# Patient Record
Sex: Male | Born: 1955 | Race: White | Hispanic: No | Marital: Married | Smoking: Former smoker
Health system: Southern US, Community
[De-identification: ages and names within clinical notes are randomized; demographics above are authoritative.]

## PROBLEM LIST (undated history)

## (undated) DIAGNOSIS — U071 COVID-19: Secondary | ICD-10-CM

## (undated) DIAGNOSIS — I10 Essential (primary) hypertension: Secondary | ICD-10-CM

## (undated) DIAGNOSIS — K219 Gastro-esophageal reflux disease without esophagitis: Secondary | ICD-10-CM

## (undated) DIAGNOSIS — E785 Hyperlipidemia, unspecified: Secondary | ICD-10-CM

## (undated) DIAGNOSIS — M1990 Unspecified osteoarthritis, unspecified site: Secondary | ICD-10-CM

## (undated) DIAGNOSIS — J189 Pneumonia, unspecified organism: Secondary | ICD-10-CM

## (undated) HISTORY — PX: TONSILLECTOMY: SUR1361

## (undated) HISTORY — PX: CARPAL TUNNEL RELEASE: SHX101

## (undated) HISTORY — DX: Pneumonia, unspecified organism: J18.9

## (undated) HISTORY — PX: ANTERIOR CERVICAL DECOMP/DISCECTOMY FUSION: SHX1161

## (undated) HISTORY — DX: Gastro-esophageal reflux disease without esophagitis: K21.9

## (undated) HISTORY — DX: Unspecified osteoarthritis, unspecified site: M19.90

## (undated) HISTORY — PX: BACK SURGERY: SHX140

## (undated) HISTORY — PX: COLON SURGERY: SHX602

## (undated) HISTORY — PX: CHOLECYSTECTOMY: SHX55

---

## 1993-03-03 DIAGNOSIS — J189 Pneumonia, unspecified organism: Secondary | ICD-10-CM

## 1993-03-03 HISTORY — DX: Pneumonia, unspecified organism: J18.9

## 2009-05-14 ENCOUNTER — Encounter: Admission: RE | Admit: 2009-05-14 | Discharge: 2009-08-12 | Payer: Self-pay | Admitting: Neurosurgery

## 2009-11-06 ENCOUNTER — Ambulatory Visit: Payer: Self-pay | Admitting: Cardiology

## 2009-11-16 ENCOUNTER — Encounter: Admission: RE | Admit: 2009-11-16 | Discharge: 2009-11-16 | Payer: Self-pay | Admitting: Cardiology

## 2009-11-16 IMAGING — US US AORTA
1 series · 14 of 21 positions shown · non-contrast
Comparison: None.

CLINICAL DATA: Hypertension, smoker, increased abdominal girth

ABDOMINAL AORTA SCREENING ULTRASOUND
TECHNIQUE: Ultrasound examination of the abdominal aorta was
performed as a screening evaluation for abdominal aortic aneurysm.
The proximal iliac arteries were also evaluated bilaterally.

[Series 1: us aorta · 14 of 21 slices shown]
[im 1/21]
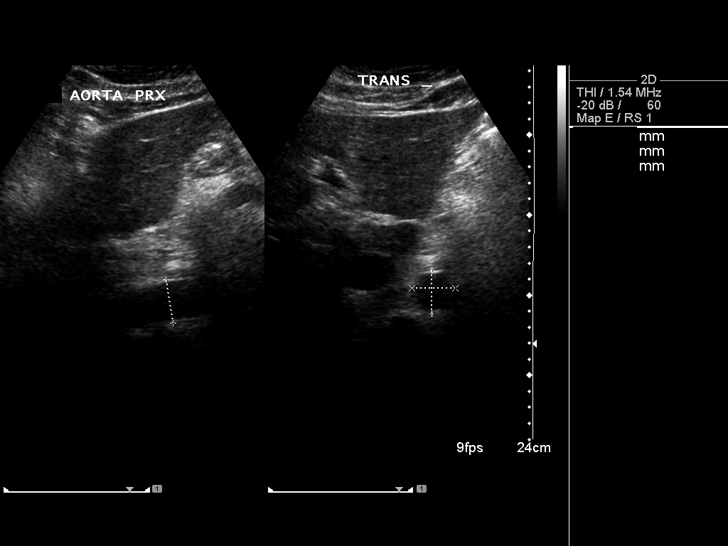
[im 3/21]
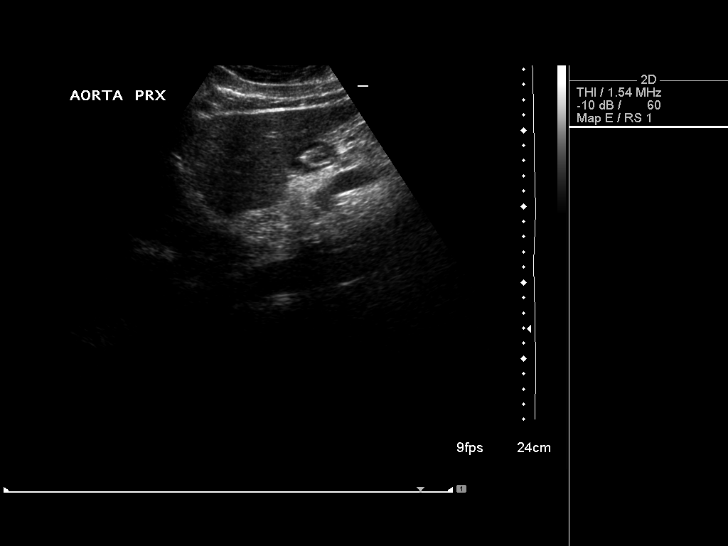
[im 4/21]
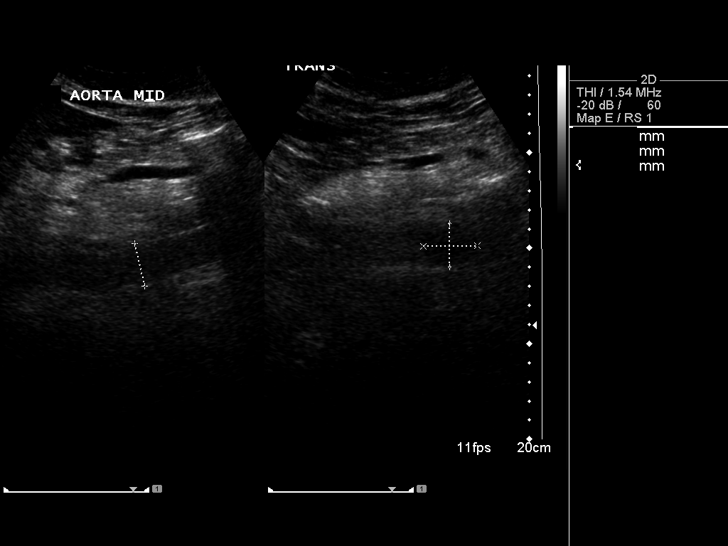
[im 6/21]
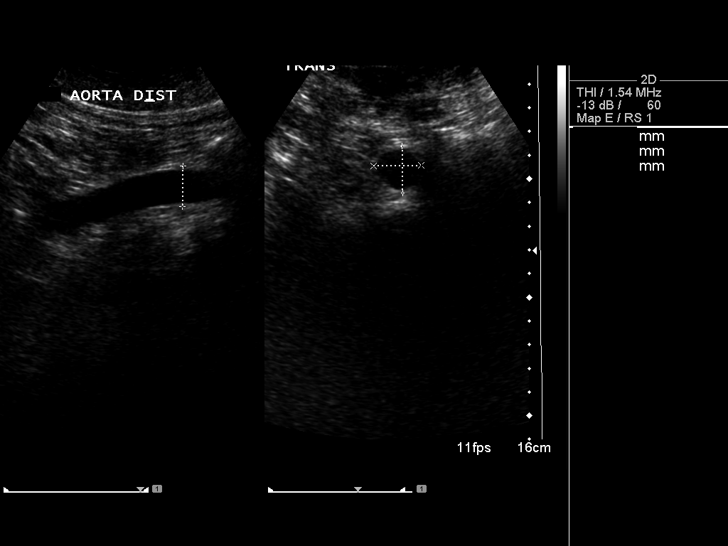
[im 7/21]
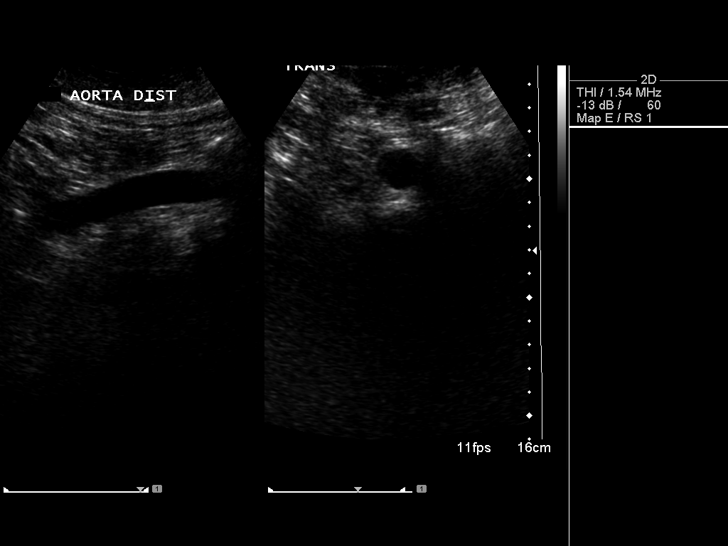
[im 9/21]
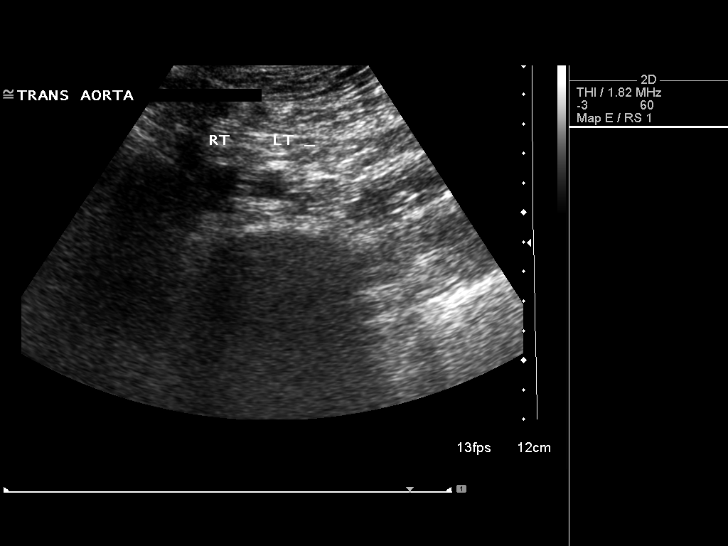
[im 10/21]
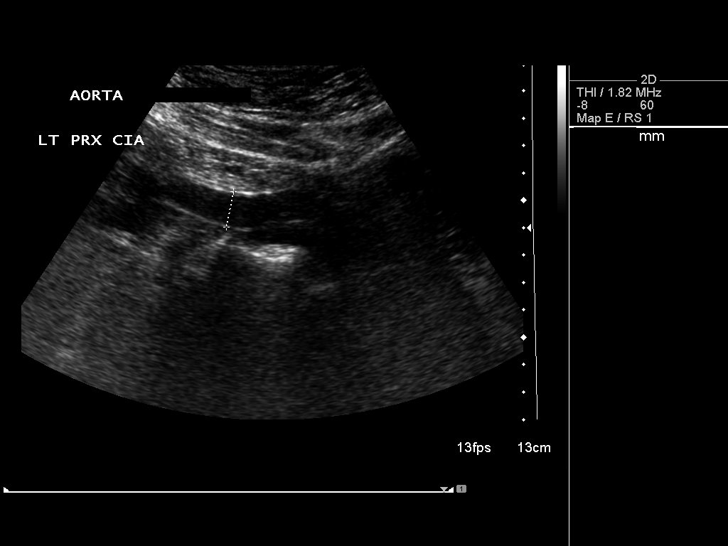
[im 12/21]
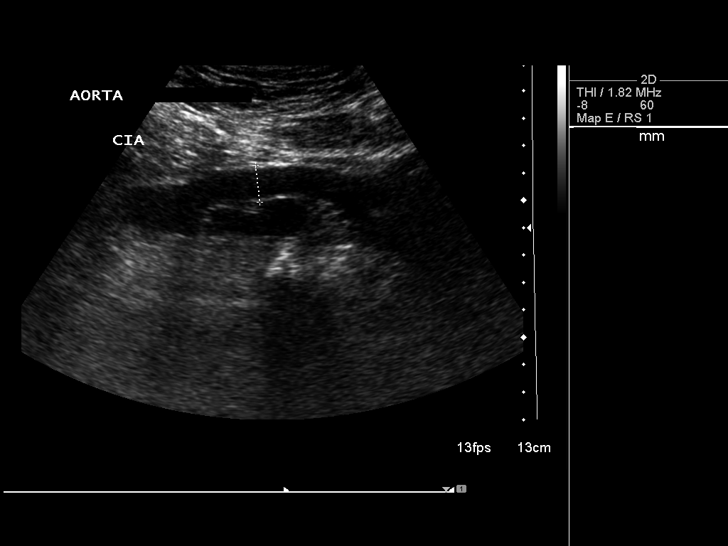
[im 13/21]
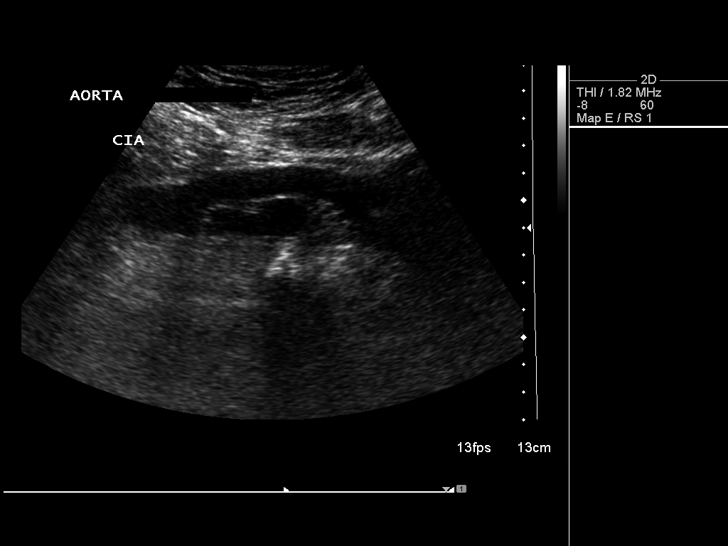
[im 15/21]
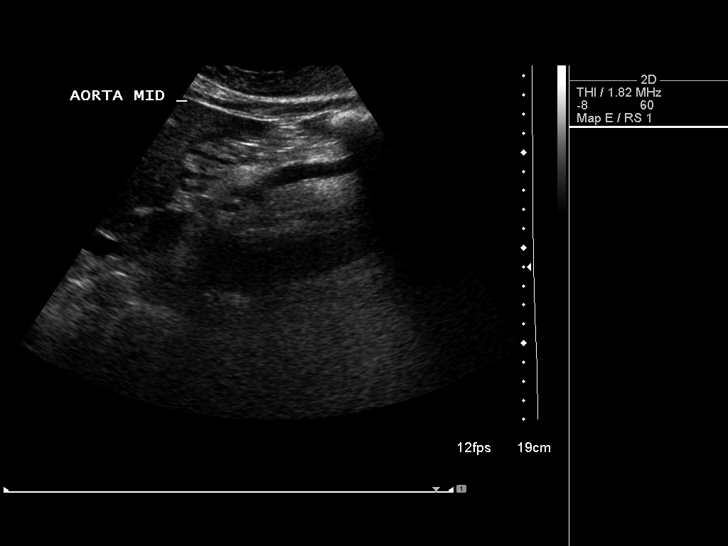
[im 16/21]
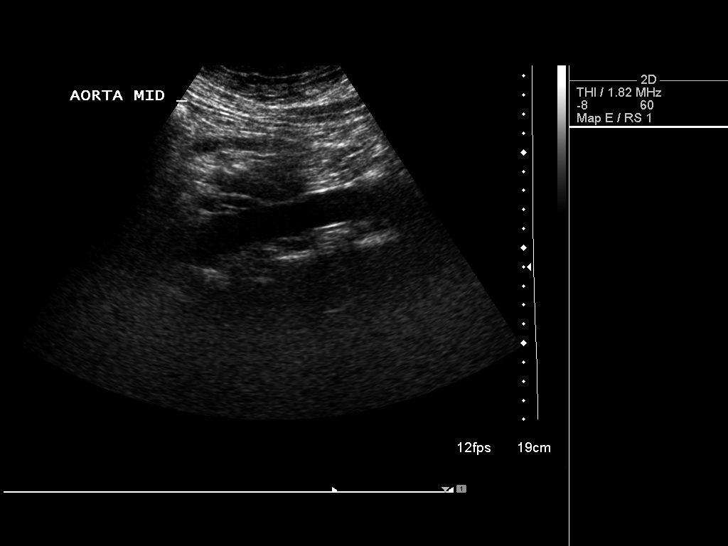
[im 18/21]
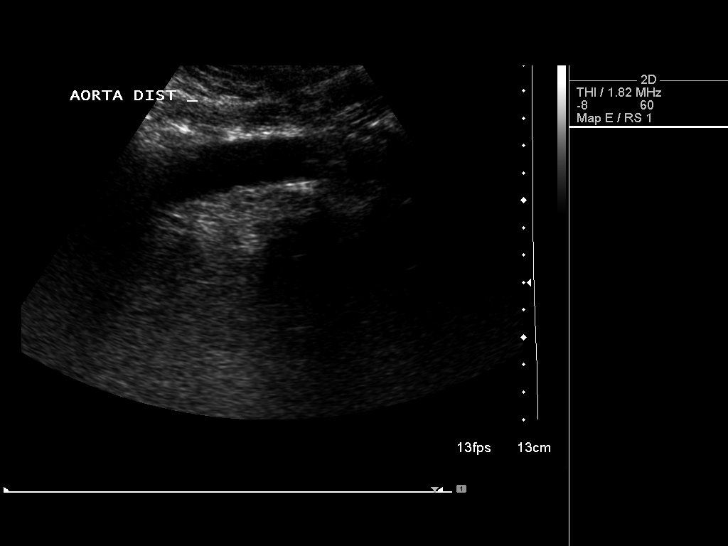
[im 19/21]
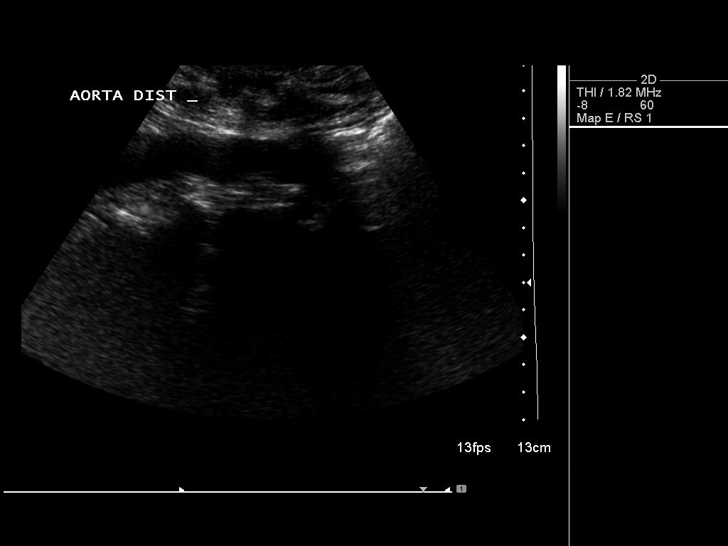
[im 21/21]
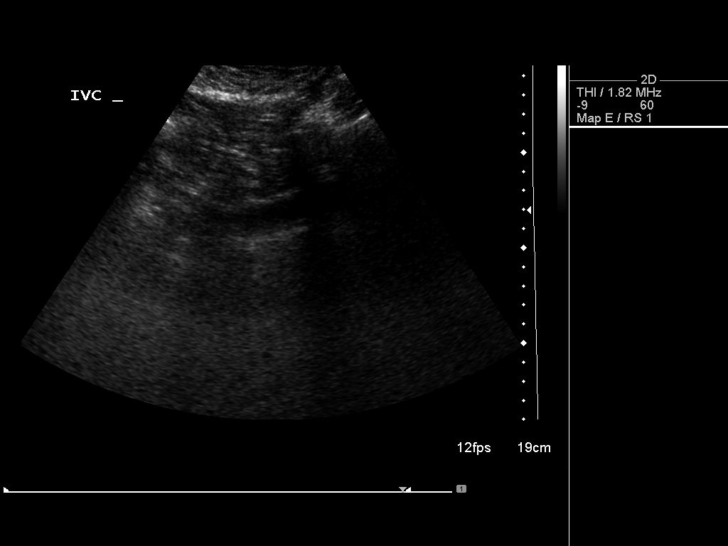

[14 of 21 positions shown; findings below may reference images not displayed]

FINDINGS: Abdominal aorta maximal AP diameter is 2.7 cm and maximal
transverse diameter is 2.8 cm.  Negative for aneurysm.  Right
common iliac artery measures 1.4 cm and the left common iliac
artery measures 1.3 cm.

IVC appears unremarkable.  No free fluid or ascites.
IMPRESSION: Negative for aneurysm.

## 2009-11-20 ENCOUNTER — Ambulatory Visit: Payer: Self-pay | Admitting: Cardiology

## 2009-11-20 ENCOUNTER — Ambulatory Visit (HOSPITAL_COMMUNITY): Admission: RE | Admit: 2009-11-20 | Discharge: 2009-11-20 | Payer: Self-pay | Admitting: Cardiology

## 2010-02-26 ENCOUNTER — Encounter
Admission: RE | Admit: 2010-02-26 | Discharge: 2010-04-02 | Payer: Self-pay | Source: Home / Self Care | Attending: Neurology | Admitting: Neurology

## 2010-03-24 ENCOUNTER — Encounter: Payer: Self-pay | Admitting: Cardiology

## 2011-11-26 ENCOUNTER — Encounter (INDEPENDENT_AMBULATORY_CARE_PROVIDER_SITE_OTHER): Payer: Self-pay | Admitting: *Deleted

## 2011-12-03 ENCOUNTER — Ambulatory Visit (INDEPENDENT_AMBULATORY_CARE_PROVIDER_SITE_OTHER): Payer: Self-pay | Admitting: Internal Medicine

## 2011-12-23 ENCOUNTER — Telehealth (INDEPENDENT_AMBULATORY_CARE_PROVIDER_SITE_OTHER): Payer: Self-pay | Admitting: *Deleted

## 2011-12-23 ENCOUNTER — Encounter (INDEPENDENT_AMBULATORY_CARE_PROVIDER_SITE_OTHER): Payer: Self-pay | Admitting: Internal Medicine

## 2011-12-23 ENCOUNTER — Other Ambulatory Visit (INDEPENDENT_AMBULATORY_CARE_PROVIDER_SITE_OTHER): Payer: Self-pay | Admitting: *Deleted

## 2011-12-23 ENCOUNTER — Ambulatory Visit (INDEPENDENT_AMBULATORY_CARE_PROVIDER_SITE_OTHER): Payer: Federal, State, Local not specified - PPO | Admitting: Internal Medicine

## 2011-12-23 ENCOUNTER — Encounter (INDEPENDENT_AMBULATORY_CARE_PROVIDER_SITE_OTHER): Payer: Self-pay | Admitting: *Deleted

## 2011-12-23 VITALS — BP 100/72 | HR 60 | Temp 97.7°F | Ht 67.0 in | Wt 285.1 lb

## 2011-12-23 DIAGNOSIS — K922 Gastrointestinal hemorrhage, unspecified: Secondary | ICD-10-CM

## 2011-12-23 DIAGNOSIS — K279 Peptic ulcer, site unspecified, unspecified as acute or chronic, without hemorrhage or perforation: Secondary | ICD-10-CM

## 2011-12-23 DIAGNOSIS — I10 Essential (primary) hypertension: Secondary | ICD-10-CM

## 2011-12-23 DIAGNOSIS — K219 Gastro-esophageal reflux disease without esophagitis: Secondary | ICD-10-CM | POA: Insufficient documentation

## 2011-12-23 DIAGNOSIS — E7800 Pure hypercholesterolemia, unspecified: Secondary | ICD-10-CM

## 2011-12-23 LAB — CBC WITH DIFFERENTIAL/PLATELET
Basophils Absolute: 0.1 10*3/uL (ref 0.0–0.1)
Lymphocytes Relative: 28 % (ref 12–46)
Neutro Abs: 4.4 10*3/uL (ref 1.7–7.7)
Platelets: 198 10*3/uL (ref 150–400)
RDW: 13.1 % (ref 11.5–15.5)
WBC: 7.7 10*3/uL (ref 4.0–10.5)

## 2011-12-23 LAB — IRON AND TIBC
Iron: 83 ug/dL (ref 42–165)
UIBC: 249 ug/dL (ref 125–400)

## 2011-12-23 NOTE — Telephone Encounter (Signed)
I would not recommend no sedation

## 2011-12-23 NOTE — Progress Notes (Signed)
Subjective:     Patient ID: Antonio Lucas, male   DOB: [birth year], 56 y.o.   MRN: [id number]  HPIReferred to our office by Dr. Lysbeth Galas for hx of PUD and rectal bleeding. He tells me he has had rectal bleeding off and on for 1 1/2 yrs. Says it is unpredictable.  He may bleed for 2-3 days in a row and then go for a month with no blood.  He underwent a colonoscopy within a year by Dr. Noe Gens and he thinks there may have been a small polyp. Hx of colon polyps. He had an EGD May of this year for GERD. He tells me he had 2 ulcers.  He says one ulcer was large. He was suppose to have a surveillance EGD 3 months later but did not follow up. Appetite is good. He says he has lost 7 pounds. He has BM daily. No recent rectal bleeding. Frequent acid reflux even with PPI. C/o dysphagia which occurs about 3 times a week. He says anything will lodge even fluids. He says the bolus will eventually go down.  Review of Systems see hpi Current Outpatient Prescriptions  Medication Sig Dispense Refill  . ALPRAZolam (XANAX) 1 MG tablet Take 1 mg by mouth at bedtime as needed.      Marland Kitchen amLODipine (NORVASC) 5 MG tablet Take 5 mg by mouth daily.      . Armodafinil (NUVIGIL) 150 MG tablet Take 150 mg by mouth daily.      Marland Kitchen dexlansoprazole (DEXILANT) 60 MG capsule Take 60 mg by mouth daily.      . furosemide (LASIX) 20 MG tablet Take 20 mg by mouth 2 (two) times daily.      Marland Kitchen HYDROcodone-acetaminophen (NORCO) 10-325 MG per tablet Take 1 tablet by mouth every 6 (six) hours as needed.      . meloxicam (MOBIC) 15 MG tablet Take 15 mg by mouth daily.      . nebivolol (BYSTOLIC) 5 MG tablet Take 10 mg by mouth daily.       Marland Kitchen oxcarbazepine (TRILEPTAL) 600 MG tablet Take 600 mg by mouth daily.      . simvastatin (ZOCOR) 40 MG tablet Take 40 mg by mouth every evening.      . tadalafil (CIALIS) 20 MG tablet Take 20 mg by mouth daily as needed.      . testosterone (ANDROGEL) 50 MG/5GM GEL Place 5 g onto the skin daily.       Past  Medical History  Diagnosis Date  . Pneumonia 1995  . GERD (gastroesophageal reflux disease)   . Arthritis    Past Surgical History  Procedure Date  . Back surgery     02.2011- HE HAD SOME NECK PROBLEMS WITH C7 .  . Carpal tunnel release     both arms   Family Status  Relation Status Death Age  . Mother Deceased     lived to age 71  . Father Deceased 3    after bypass surgery   History   Social History  . Marital Status: Married    Spouse Name: N/A    Number of Children: N/A  . Years of Education: N/A   Occupational History  . Not on file.   Social History Main Topics  . Smoking status: Never Smoker   . Smokeless tobacco: Not on file  . Alcohol Use: No  . Drug Use: No  . Sexually Active: Not on file   Other Topics Concern  . Not on  file   Social History Narrative   He has 3 children  With one son with Epsteins anomaly. He ihas has surgical surgical repair . Denman stopped smoking 3 years ago one time , he smoked 2-3 packs per day. He will drink one glass of wine per day. He works on [employer].   Allergies  Allergen Reactions  . Duragen (Estradiol Valerate)     duragesic patch causes nausea       Objective:   Physical Exam Filed Vitals:   12/23/11 0947  BP: 100/72  Pulse: 60  Temp: 97.7 F (36.5 C)  Height: 5\' 7"  (1.702 m)  Weight: 285 lb 1.6 oz (129.321 kg)   Alert and oriented. Skin warm and dry. Oral mucosa is moist.   . Sclera anicteric, conjunctivae is pink. Thyroid not enlarged. No cervical lymphadenopathy. Lungs clear. Heart regular rate and rhythm.  Abdomen is soft. Bowel sounds are positive. No hepatomegaly. No abdominal masses felt. No tenderness.  No edema to lower extremities.  Stool loose, guaiac negative     Assessment:    Hx of GI bleed per patient. PUD. In need of surveillance EGD to document healing of large ulcer.    Plan:    Will get records from Dr. Phill Mutter office. CBC, Iron studies. EGD/ED with Dr. Karilyn Cota. The  risks and benefits such as perforation, bleeding, and infection were reviewed with the patient and is agreeable.

## 2011-12-23 NOTE — Telephone Encounter (Signed)
Patient sch'd for EGD/ED 01/07/12 and wants to know if you would consider doing procedure without sedation

## 2011-12-23 NOTE — Patient Instructions (Addendum)
EGD/ED with Dr. Rehman. The risks and benefits such as perforation, bleeding, and infection were reviewed with the patient and is agreeable. 

## 2011-12-24 ENCOUNTER — Telehealth (INDEPENDENT_AMBULATORY_CARE_PROVIDER_SITE_OTHER): Payer: Self-pay | Admitting: Internal Medicine

## 2011-12-24 NOTE — Telephone Encounter (Signed)
Received records from [facility]. EGD 02/17/2012: Esophagitis, Gastritis and Gastric Ulcer. Need f/u EGD in 2 months which he did not have. H. Pylori was negative.11/19/2011 11/19/2011 H and H 16.6 and 47.9, Iron 247, TIBC 331, UIBC 84, % Sat 75.

## 2011-12-24 NOTE — Telephone Encounter (Signed)
Patient made aware.

## 2011-12-26 ENCOUNTER — Ambulatory Visit (INDEPENDENT_AMBULATORY_CARE_PROVIDER_SITE_OTHER): Payer: Federal, State, Local not specified - PPO | Admitting: Cardiology

## 2011-12-26 ENCOUNTER — Encounter: Payer: Self-pay | Admitting: Cardiology

## 2011-12-26 VITALS — BP 131/82 | HR 58 | Ht 67.0 in | Wt 284.0 lb

## 2011-12-26 DIAGNOSIS — R0602 Shortness of breath: Secondary | ICD-10-CM

## 2011-12-26 DIAGNOSIS — Z79899 Other long term (current) drug therapy: Secondary | ICD-10-CM

## 2011-12-26 DIAGNOSIS — I10 Essential (primary) hypertension: Secondary | ICD-10-CM

## 2011-12-26 DIAGNOSIS — E7800 Pure hypercholesterolemia, unspecified: Secondary | ICD-10-CM

## 2011-12-26 DIAGNOSIS — R609 Edema, unspecified: Secondary | ICD-10-CM

## 2011-12-26 MED ORDER — ATORVASTATIN CALCIUM 40 MG PO TABS: 40.0000 mg | ORAL_TABLET | Freq: Every evening | ORAL | Status: DC

## 2011-12-26 NOTE — Patient Instructions (Signed)
   Echo  Office will contact with results  Stop Zocor  Change to generic Lipitor 40mg  every evening  Labs - due in 2 months (around December 25) for fasting lipid and liver   Aerobic exercise 20-30 minutes 5-6 x week Continue all other current medications.  Your physician wants you to follow up in: 6 months.  You will receive a reminder letter in the mail one-two months in advance.  If you don't receive a letter, please call our office to schedule the follow up appointment

## 2011-12-27 DIAGNOSIS — R609 Edema, unspecified: Secondary | ICD-10-CM | POA: Insufficient documentation

## 2011-12-27 NOTE — Progress Notes (Signed)
Patient ID: Antonio Lucas, male   DOB: [birth year], 56 y.o.   MRN: [id number] PCP: Dr. Lysbeth Galas  55 yo with history of HTN and hyperlipidemia with a strong family history of premature CAD presents for cardiology evaluation.  He has been seen by Dr. Deborah Chalk in the past and is seen by me for the first time today.  In general, patient has been stable symptomatically.  He had a stress test in 2011 that per his report was normal.  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  No exertional dyspnea but he does not feel that he has the stamina that he had in the past.  He has a lot of low back pain that limits exercise.  He has occasional nonexertional left chest pain that can last a few seconds to several minutes.  No particular trigger.  He has chronic lower extremity edema and take Lasix for this.  He is under a fair amount of stress at work.  He does not get much exercise at home.   PMH: 1. OSA: On CPAP 2. H/o L-spine surgery 3. HTN 4. GERD 5. Nuclear stress test in 2011 was normal.  6. Hyperlipidemia 7. Venous insufficiency 8. Obesity  SH: Lives in [place], quit smoking in 2008, 4 children.  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   FH: Father with first MI at 40, uncle with MI at 62, other uncles with MIs in their 18s and 93s.    ROS: All systems reviewed and negative except as per HPI.   Current Outpatient Prescriptions  Medication Sig Dispense Refill  . ALPRAZolam (XANAX) 1 MG tablet Take 1 mg by mouth at bedtime as needed.      Marland Kitchen amLODipine (NORVASC) 5 MG tablet Take 5 mg by mouth daily.      . Armodafinil (NUVIGIL) 150 MG tablet Take 150 mg by mouth daily.      Marland Kitchen dexlansoprazole (DEXILANT) 60 MG capsule Take 60 mg by mouth daily.      . furosemide (LASIX) 20 MG tablet Take 20 mg by mouth daily.       Marland Kitchen HYDROcodone-acetaminophen (NORCO) 10-325 MG per tablet Take 1 tablet by mouth every 6 (six) hours as needed.      Marland Kitchen JALYN 0.5-0.4 MG CAPS Take 1 capsule by  mouth Daily.      Marland Kitchen lamoTRIgine (LAMICTAL) 25 MG tablet Take 1 tablet by mouth Daily.      . meloxicam (MOBIC) 15 MG tablet Take 15 mg by mouth daily.      . metoCLOPramide (REGLAN) 10 MG tablet Take 1 tablet by mouth Daily.      . nebivolol (BYSTOLIC) 5 MG tablet Take 10 mg by mouth daily.       Marland Kitchen oxcarbazepine (TRILEPTAL) 600 MG tablet Take 600 mg by mouth daily.      . tadalafil (CIALIS) 20 MG tablet Take 20 mg by mouth daily as needed.      . testosterone cypionate (DEPOTESTOTERONE CYPIONATE) 200 MG/ML injection Inject 1 mL into the muscle Once every 2 weeks.      . traZODone (DESYREL) 150 MG tablet Take 1 tablet by mouth daily as needed.       . Vitamin D, Ergocalciferol, (DRISDOL) 50000 UNITS CAPS Take 1 capsule by mouth Once a week.      Marland Kitchen atorvastatin (LIPITOR) 40 MG tablet Take 1 tablet (40 mg total) by mouth every evening.  30 tablet  6    BP 131/82  Pulse 58  Ht 5\' 7"  (1.702 m)  Wt 284 lb (128.822 kg)  BMI 44.48 kg/m2  SpO2 96% General: NAD, obese Neck: Thick, no JVD, no thyromegaly or thyroid nodule.  Lungs: Clear to auscultation bilaterally with normal respiratory effort. CV: Nondisplaced PMI.  Heart regular S1/S2, no S3/S4, no murmur.  1+ edema 3/4 to knees bilaterally.  No carotid bruit.  Normal pedal pulses.  Abdomen: Soft, nontender, no hepatosplenomegaly, no distention.  Skin: Intact without lesions or rashes.  Neurologic: Alert and oriented x 3.  Psych: Normal affect. Extremities: No clubbing or cyanosis.  HEENT: Normal.   Assessment/Plan: 1. Hyperlipidemia: Patient is on Zocor 40 and amlodipine 5.  This combination leads to increased risks of statin-related side effects. I am going to have him stop Zocor and instead take atorvastatin 40 mg daily.  I will have him return for lipids/LFTs in 2 months.  2. HTN: BP is under good control.  He is on amlodipine, which can cause lower extremity edema.  Alternatively, the edema may be due to abdominal obesity and lower  extremity venous insufficiency.  I am going to have him continue amlodipine for now.  3. Lower extremity edema: Suspect major cause is venous insufficiency in the setting of abdominal obesity.  Amlodipine may play a role.  I think CHF is less likely.  I will get an echocardiogram to make sure that LV and RV function appear normal.  Weight loss will likely help.  I would not increase Lasix further as JVP is not elevated.  4. Obesity: Patient needs weight loss.  We discussed diet and exercise strategies today.  5. CAD risk: Strong family history of premature CAD.  He does not have ischemic symptoms.  He should continue statin and I will start him on ASA 81 mg daily.   Marca Ancona 12/27/2011 12:03 PM

## 2011-12-31 ENCOUNTER — Encounter (HOSPITAL_COMMUNITY): Payer: Self-pay | Admitting: Pharmacy Technician

## 2012-01-01 ENCOUNTER — Other Ambulatory Visit: Payer: Federal, State, Local not specified - PPO

## 2012-01-01 ENCOUNTER — Encounter: Payer: Self-pay | Admitting: Physician Assistant

## 2012-01-01 DIAGNOSIS — R0989 Other specified symptoms and signs involving the circulatory and respiratory systems: Secondary | ICD-10-CM

## 2012-01-07 ENCOUNTER — Encounter (HOSPITAL_COMMUNITY): Payer: Self-pay | Admitting: *Deleted

## 2012-01-07 ENCOUNTER — Encounter (HOSPITAL_COMMUNITY)
Admission: RE | Disposition: A | Discharge status: Home / Self Care | Payer: Self-pay | Source: Ambulatory Visit | Attending: Internal Medicine

## 2012-01-07 ENCOUNTER — Ambulatory Visit (HOSPITAL_COMMUNITY)
Admission: RE | Admit: 2012-01-07 | Discharge: 2012-01-07 | Disposition: A | Discharge status: Home / Self Care | Payer: Federal, State, Local not specified - PPO | Source: Ambulatory Visit | Attending: Internal Medicine | Admitting: Internal Medicine

## 2012-01-07 ENCOUNTER — Other Ambulatory Visit: Payer: Federal, State, Local not specified - PPO

## 2012-01-07 DIAGNOSIS — K2289 Other specified disease of esophagus: Secondary | ICD-10-CM | POA: Insufficient documentation

## 2012-01-07 DIAGNOSIS — K219 Gastro-esophageal reflux disease without esophagitis: Secondary | ICD-10-CM | POA: Insufficient documentation

## 2012-01-07 DIAGNOSIS — R1310 Dysphagia, unspecified: Secondary | ICD-10-CM | POA: Insufficient documentation

## 2012-01-07 DIAGNOSIS — K922 Gastrointestinal hemorrhage, unspecified: Secondary | ICD-10-CM

## 2012-01-07 DIAGNOSIS — K279 Peptic ulcer, site unspecified, unspecified as acute or chronic, without hemorrhage or perforation: Secondary | ICD-10-CM

## 2012-01-07 DIAGNOSIS — K449 Diaphragmatic hernia without obstruction or gangrene: Secondary | ICD-10-CM

## 2012-01-07 DIAGNOSIS — I10 Essential (primary) hypertension: Secondary | ICD-10-CM | POA: Insufficient documentation

## 2012-01-07 DIAGNOSIS — Z8719 Personal history of other diseases of the digestive system: Secondary | ICD-10-CM

## 2012-01-07 DIAGNOSIS — K2960 Other gastritis without bleeding: Secondary | ICD-10-CM

## 2012-01-07 DIAGNOSIS — Z8711 Personal history of peptic ulcer disease: Secondary | ICD-10-CM

## 2012-01-07 DIAGNOSIS — K228 Other specified diseases of esophagus: Secondary | ICD-10-CM | POA: Insufficient documentation

## 2012-01-07 HISTORY — DX: Hyperlipidemia, unspecified: E78.5

## 2012-01-07 HISTORY — PX: ESOPHAGOGASTRODUODENOSCOPY (EGD) WITH ESOPHAGEAL DILATION: SHX5812

## 2012-01-07 HISTORY — DX: Essential (primary) hypertension: I10

## 2012-01-07 SURGERY — ESOPHAGOGASTRODUODENOSCOPY (EGD) WITH ESOPHAGEAL DILATION
Anesthesia: Moderate Sedation

## 2012-01-07 MED ORDER — MEPERIDINE HCL 50 MG/ML IJ SOLN
INTRAMUSCULAR | Status: AC
  Filled 2012-01-07: qty 1

## 2012-01-07 MED ORDER — SODIUM CHLORIDE 0.45 % IV SOLN
INTRAVENOUS | Status: DC
  Administered 2012-01-07: 1000 mL via INTRAVENOUS

## 2012-01-07 MED ORDER — MEPERIDINE HCL 25 MG/ML IJ SOLN
INTRAMUSCULAR | Status: DC | PRN
  Administered 2012-01-07 (×2): 25 mg via INTRAVENOUS

## 2012-01-07 MED ORDER — MELOXICAM 15 MG PO TABS: 7.5000 mg | ORAL_TABLET | Freq: Every day | ORAL | Status: DC

## 2012-01-07 MED ORDER — MIDAZOLAM HCL 5 MG/5ML IJ SOLN
INTRAMUSCULAR | Status: AC
  Filled 2012-01-07: qty 10

## 2012-01-07 MED ORDER — BUTAMBEN-TETRACAINE-BENZOCAINE 2-2-14 % EX AERO
INHALATION_SPRAY | CUTANEOUS | Status: DC | PRN
  Administered 2012-01-07: 2 via TOPICAL

## 2012-01-07 MED ORDER — STERILE WATER FOR IRRIGATION IR SOLN
Status: DC | PRN
  Administered 2012-01-07: 14:00:00

## 2012-01-07 MED ORDER — MIDAZOLAM HCL 5 MG/5ML IJ SOLN
INTRAMUSCULAR | Status: DC | PRN
  Administered 2012-01-07 (×2): 2 mg via INTRAVENOUS
  Administered 2012-01-07: 3 mg via INTRAVENOUS

## 2012-01-07 NOTE — H&P (Addendum)
Antonio Lucas is an 56 y.o. male.   Chief Complaint: Patient is here for esophagogastroduodenoscopy. HPI: Patient is 56 year old Caucasian male who has history of peptic ulcer disease at age 11. He was found to have to gastric ulcers in may 2013. He's been maintained on PPI. He's also been maintained on his meloxicam. He was advised to return for followup exam in 3 months but he did not do so. His initial endoscopy was in [facility]. If 6 weeks ago he had an episode of tarry bleeding. Therefore he was referred to her office. His H&H in 2 weeks ago was normal. He hasn't passed any more blood since then. He denies abdominal pain he has a good appetite. His heartburn she controlled with therapy. His blood test for H. pylori came back negative. Patient also complains of intermittent solid food dysphagia.  Past Medical History  Diagnosis Date  . Pneumonia 1995  . GERD (gastroesophageal reflux disease)   . Arthritis   . Hyperlipemia   . Hypertension     Past Surgical History  Procedure Date  . Back surgery     02.2011- HE HAD SOME NECK PROBLEMS WITH C7 .  . Carpal tunnel release     both arms  . Tonsillectomy     History reviewed. No pertinent family history. Social History:  reports that he quit smoking about 4 years ago. His smoking use included Cigarettes. He started smoking about 49 years ago. He has a 80 pack-year smoking history. He has never used smokeless tobacco. He reports that he drinks alcohol. He reports that he does not use illicit drugs.  Allergies:  Allergies  Allergen Reactions  . Duragen (Estradiol Valerate)     duragesic patch causes nausea    Medications Prior to Admission  Medication Sig Dispense Refill  . ALPRAZolam (XANAX) 1 MG tablet Take 1 mg by mouth at bedtime as needed. Sleep/anxiety.      Marland Kitchen amLODipine (NORVASC) 5 MG tablet Take 5 mg by mouth daily.      . Armodafinil (NUVIGIL) 150 MG tablet Take 150 mg by mouth daily.      Marland Kitchen aspirin EC 81 MG  tablet Take 81 mg by mouth daily.      Marland Kitchen atorvastatin (LIPITOR) 40 MG tablet Take 1 tablet (40 mg total) by mouth every evening.  30 tablet  6  . dexlansoprazole (DEXILANT) 60 MG capsule Take 60 mg by mouth daily.      . furosemide (LASIX) 20 MG tablet Take 20 mg by mouth daily.       Marland Kitchen JALYN 0.5-0.4 MG CAPS Take 1 capsule by mouth Daily.      Marland Kitchen lamoTRIgine (LAMICTAL) 25 MG tablet Take 2 tablets by mouth Daily.       . meloxicam (MOBIC) 15 MG tablet Take 15 mg by mouth daily.      . metoCLOPramide (REGLAN) 10 MG tablet Take 1 tablet by mouth Daily.      . nebivolol (BYSTOLIC) 5 MG tablet Take 10 mg by mouth daily.       Marland Kitchen oxcarbazepine (TRILEPTAL) 600 MG tablet Take 600 mg by mouth daily.      Marland Kitchen testosterone cypionate (DEPOTESTOTERONE CYPIONATE) 200 MG/ML injection Inject 0.5 mLs into the muscle Once every 2 weeks.       . traZODone (DESYREL) 150 MG tablet Take 1 tablet by mouth at bedtime.       . Vitamin D, Ergocalciferol, (DRISDOL) 50000 UNITS CAPS Take 1 capsule  by mouth Once a week.      Marland Kitchen HYDROcodone-acetaminophen (NORCO) 10-325 MG per tablet Take 1 tablet by mouth every 6 (six) hours as needed. Pain.      . tadalafil (CIALIS) 20 MG tablet Take 20 mg by mouth daily as needed. ED.        No results found for this or any previous visit (from the past 48 hour(s)). No results found.  ROS  Blood pressure 135/84, pulse 61, temperature 97.8 F (36.6 C), resp. rate 19, height 5\' 7"  (1.702 m), weight 284 lb (128.822 kg), SpO2 94.00%. Physical Exam  Constitutional: He appears well-developed and well-nourished.  HENT:  Mouth/Throat: Oropharynx is clear and moist.  Eyes: Conjunctivae normal are normal. No scleral icterus.  Neck: No thyromegaly present.  Cardiovascular: Normal rate, regular rhythm and normal heart sounds.   No murmur heard. Respiratory: Effort normal and breath sounds normal.  GI: Soft. He exhibits no distension. There is no tenderness.  Musculoskeletal: He exhibits no  edema.  Lymphadenopathy:    He has no cervical adenopathy.  Neurological: He is alert.  Skin: Skin is warm and dry.     Assessment/Plan History of peptic ulcer disease with recurrent bleed. Patient also complains of dysphagia. EGD with ED.  Ercie Eliasen U 01/07/2012, 2:15 PM

## 2012-01-07 NOTE — Op Note (Signed)
EGD PROCEDURE REPORT  PATIENT:  Antonio Lucas  MR#:  [id number] Birthdate:  09/20/1955, 56 y.o., male Endoscopist:  Dr. Malissa Hippo, MD Referred By:  Dr. Josue Hector, MD Procedure Date: 01/07/2012  Procedure:   EGD and ED.  Indications:  Patient is 56 year old Caucasian male was history of recurrent peptic ulcers disease dating back to age 77. His last EGD was in may 2013 in [place], [place] near to gastric ulcers. He was advised followup but he did not do so. But 6 weeks ago had tarry stools. His hemoglobin is 15.1. He is on PPI but he remains on meloxicam. He also complains of intermittent solid food dysphagia. He is undergoing diagnostic/therapeutic EGD.            Informed Consent:  The risks, benefits, alternatives & imponderables which include, but are not limited to, bleeding, infection, perforation, drug reaction and potential missed lesion have been reviewed.  The potential for biopsy, lesion removal, esophageal dilation, etc. have also been discussed.  Questions have been answered.  All parties agreeable.  Please see history & physical in medical record for more information.  Medications:  Demerol 50 mg IV Versed 7 mg IV Cetacaine spray topically for oropharyngeal anesthesia  Description of procedure:  The endoscope was introduced through the mouth and advanced to the second portion of the duodenum without difficulty or limitations. The mucosal surfaces were surveyed very carefully during advancement of the scope and upon withdrawal.  Findings:  Esophagus:  Mucosa of the esophagus was normal. There was patchy and linear cheesy exudate at esophageal body.  GEJ:  40 cm Hiatus:  42 cm Stomach:  Stomach was empty and distended very well with insufflation. Folds in the proximal stomach were normal. Examination of mucosa at body was normal. There were few antral erosions. No ulcer was identified . Pyloric channel was patent. Angularis, fundus and cardia were  examined by retroflexing the scope and were normal. Duodenum:  Normal bulbar and post bulbar mucosa.  Therapeutic/Diagnostic Maneuvers Performed:  Brushing was obtained from the esophagus for KOH prep.  Esophagus was dilated by passing 56 [place] Maloney dilator. Esophagus was examined post dilation and no mucosal disruption noted.  Complications:  None  Impression: Patchy whitish exudate coating esophageal mucosa. Brushing taken to rule out Candida esophagitis. He was she identified gastric ulcers have completely healed. Small sliding hiatal hernia without ring or stricture formation. Esophagus dilated by passing 56 [place] Maloney dilator given history of dysphagia. Antral erosions most likely secondary to NSAIDs.   Recommendations:  Patient will continue anti-reflux measures and  Dexlansoprazole as before. Patient advised to discontinue metoclopramide. Patient advised to decrease meloxicam to 7.5-15 mg by mouth daily when necessary. I will contact patient with results of KOH prep. Office visit in 6 weeks.  Lenora Gomes U  01/07/2012  2:49 PM  CC: Dr. Josue Hector, MD & Dr. Bonnetta Barry ref. provider found

## 2012-01-08 ENCOUNTER — Other Ambulatory Visit (INDEPENDENT_AMBULATORY_CARE_PROVIDER_SITE_OTHER): Payer: Federal, State, Local not specified - PPO

## 2012-01-08 ENCOUNTER — Other Ambulatory Visit: Payer: Self-pay

## 2012-01-08 DIAGNOSIS — R0602 Shortness of breath: Secondary | ICD-10-CM

## 2012-01-09 ENCOUNTER — Encounter: Payer: Self-pay | Admitting: *Deleted

## 2012-01-13 ENCOUNTER — Encounter (HOSPITAL_COMMUNITY): Payer: Self-pay | Admitting: Internal Medicine

## 2012-01-14 NOTE — Progress Notes (Signed)
Apt has been scheduled for 03/09/11 with Dorene Ar, NP.

## 2012-01-16 ENCOUNTER — Encounter (INDEPENDENT_AMBULATORY_CARE_PROVIDER_SITE_OTHER): Payer: Self-pay

## 2012-01-29 ENCOUNTER — Emergency Department (HOSPITAL_BASED_OUTPATIENT_CLINIC_OR_DEPARTMENT_OTHER)
Admission: EM | Admit: 2012-01-29 | Discharge: 2012-01-29 | Disposition: A | Discharge status: Home / Self Care | Payer: Federal, State, Local not specified - PPO | Attending: Emergency Medicine | Admitting: Emergency Medicine

## 2012-01-29 ENCOUNTER — Emergency Department (HOSPITAL_BASED_OUTPATIENT_CLINIC_OR_DEPARTMENT_OTHER): Payer: Federal, State, Local not specified - PPO

## 2012-01-29 ENCOUNTER — Encounter (HOSPITAL_BASED_OUTPATIENT_CLINIC_OR_DEPARTMENT_OTHER): Payer: Self-pay | Admitting: Emergency Medicine

## 2012-01-29 DIAGNOSIS — Z7982 Long term (current) use of aspirin: Secondary | ICD-10-CM | POA: Insufficient documentation

## 2012-01-29 DIAGNOSIS — Y9301 Activity, walking, marching and hiking: Secondary | ICD-10-CM | POA: Insufficient documentation

## 2012-01-29 DIAGNOSIS — S0083XA Contusion of other part of head, initial encounter: Secondary | ICD-10-CM

## 2012-01-29 DIAGNOSIS — S0120XA Unspecified open wound of nose, initial encounter: Secondary | ICD-10-CM | POA: Insufficient documentation

## 2012-01-29 DIAGNOSIS — S0180XA Unspecified open wound of other part of head, initial encounter: Secondary | ICD-10-CM

## 2012-01-29 DIAGNOSIS — W108XXA Fall (on) (from) other stairs and steps, initial encounter: Secondary | ICD-10-CM | POA: Insufficient documentation

## 2012-01-29 DIAGNOSIS — Z8739 Personal history of other diseases of the musculoskeletal system and connective tissue: Secondary | ICD-10-CM | POA: Insufficient documentation

## 2012-01-29 DIAGNOSIS — Y92009 Unspecified place in unspecified non-institutional (private) residence as the place of occurrence of the external cause: Secondary | ICD-10-CM | POA: Insufficient documentation

## 2012-01-29 DIAGNOSIS — W010XXA Fall on same level from slipping, tripping and stumbling without subsequent striking against object, initial encounter: Secondary | ICD-10-CM

## 2012-01-29 DIAGNOSIS — Z8701 Personal history of pneumonia (recurrent): Secondary | ICD-10-CM | POA: Insufficient documentation

## 2012-01-29 DIAGNOSIS — S0003XA Contusion of scalp, initial encounter: Secondary | ICD-10-CM | POA: Insufficient documentation

## 2012-01-29 DIAGNOSIS — E785 Hyperlipidemia, unspecified: Secondary | ICD-10-CM | POA: Insufficient documentation

## 2012-01-29 DIAGNOSIS — Z8719 Personal history of other diseases of the digestive system: Secondary | ICD-10-CM | POA: Insufficient documentation

## 2012-01-29 DIAGNOSIS — I10 Essential (primary) hypertension: Secondary | ICD-10-CM | POA: Insufficient documentation

## 2012-01-29 DIAGNOSIS — Z79899 Other long term (current) drug therapy: Secondary | ICD-10-CM | POA: Insufficient documentation

## 2012-01-29 DIAGNOSIS — Z87891 Personal history of nicotine dependence: Secondary | ICD-10-CM | POA: Insufficient documentation

## 2012-01-29 IMAGING — CT CT HEAD W/O CM
1 series · 15 of 30 positions shown, 19 images · non-contrast
Comparison: None.

CLINICAL DATA: Status post fall, with question of loss of
consciousness.  Hit head on concrete.  Bruising and swelling at the
forehead.

CT HEAD WITHOUT CONTRAST
TECHNIQUE: Contiguous axial images were obtained from the base of
the skull through the vertex without contrast.

[Series 2: head 4.8 h37s · axial · 0.50mm/px · z∈[-174,-18]mm · 15 of 36 slices shown, 19 images]
[im 2/36  brain]
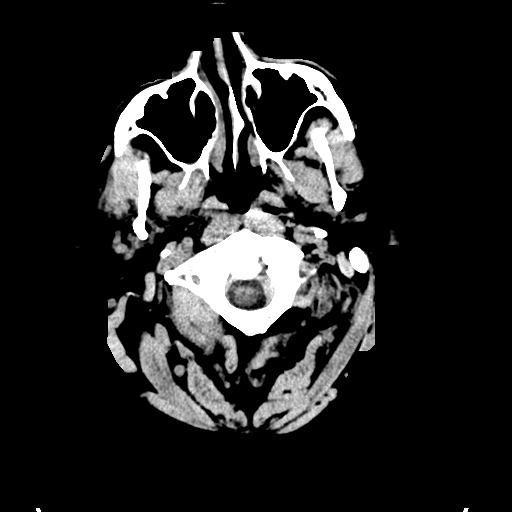
[im 2/36  bone]
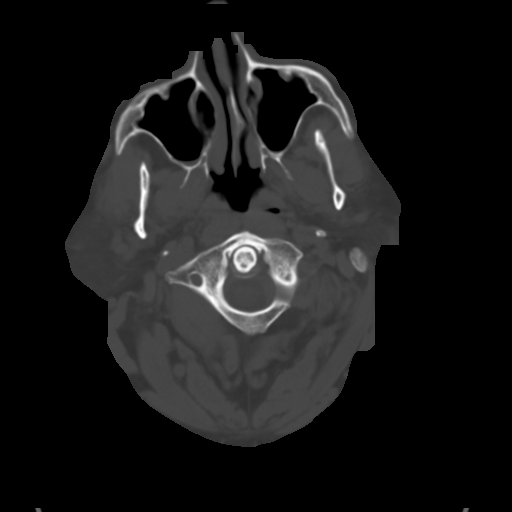
[im 4/36  brain]
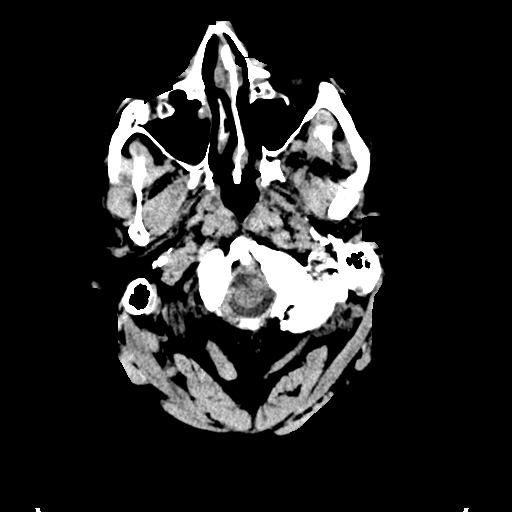
[im 7/36  brain]
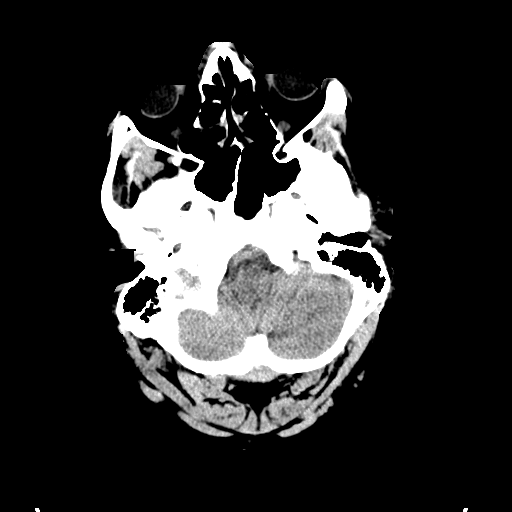
[im 9/36  brain]
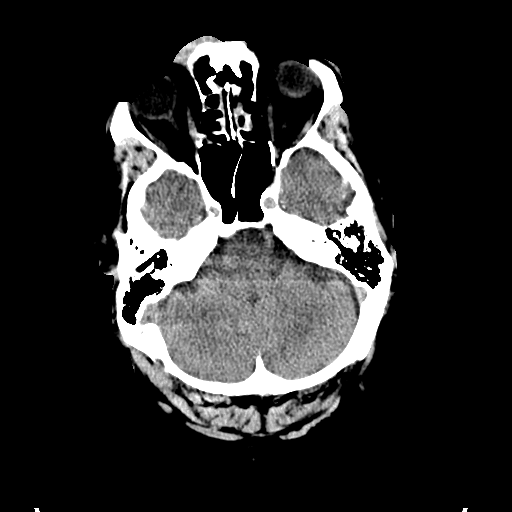
[im 11/36  brain]
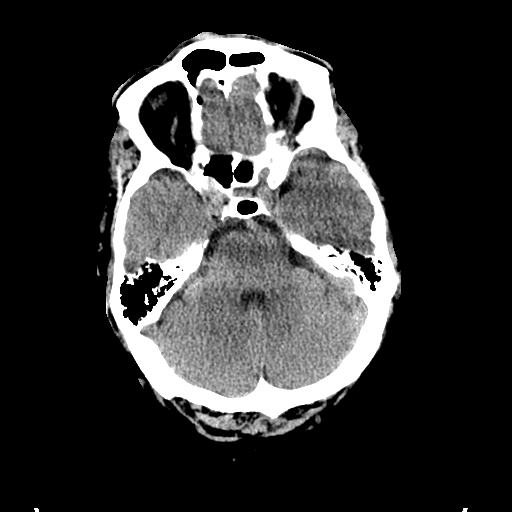
[im 11/36  bone]
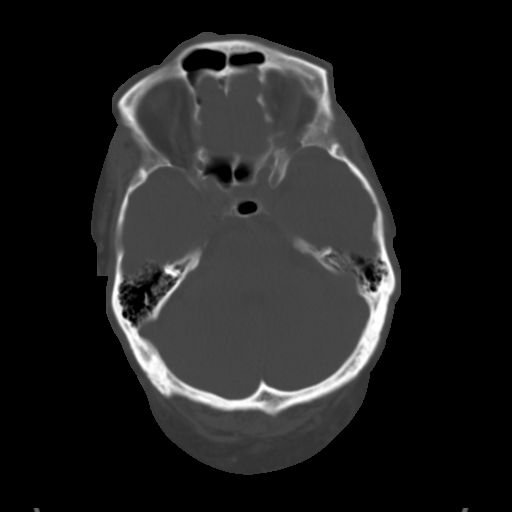
[im 14/36  brain]
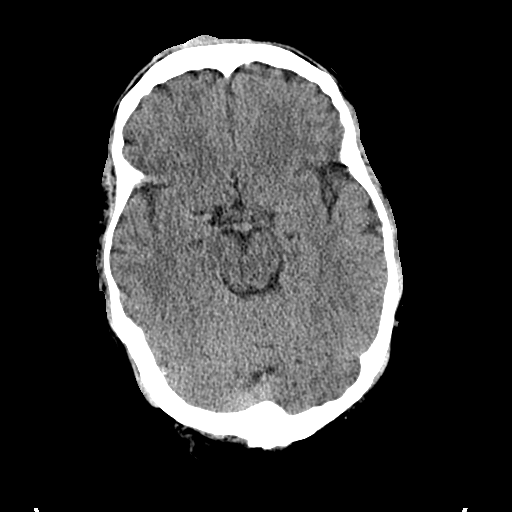
[im 16/36  brain]
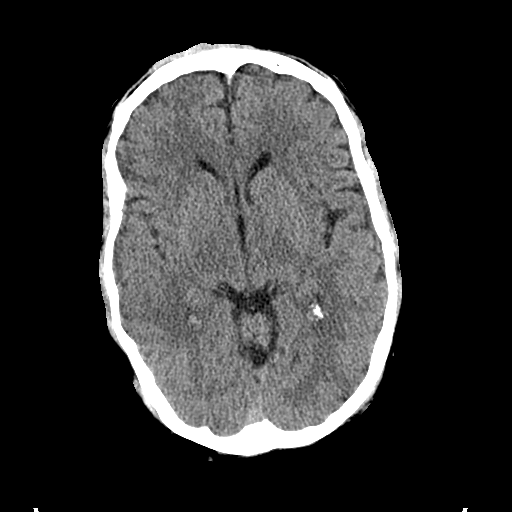
[im 19/36  brain]
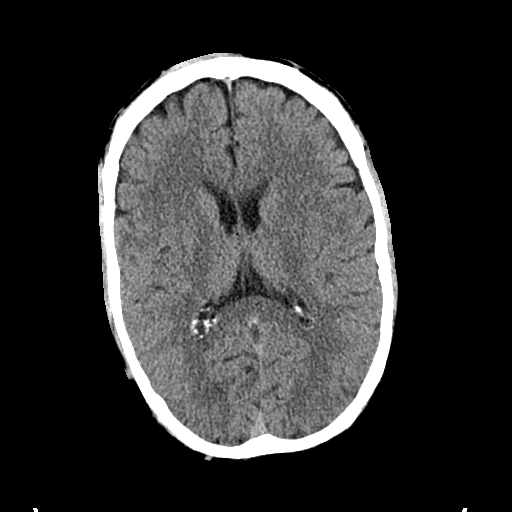
[im 20/36  brain]
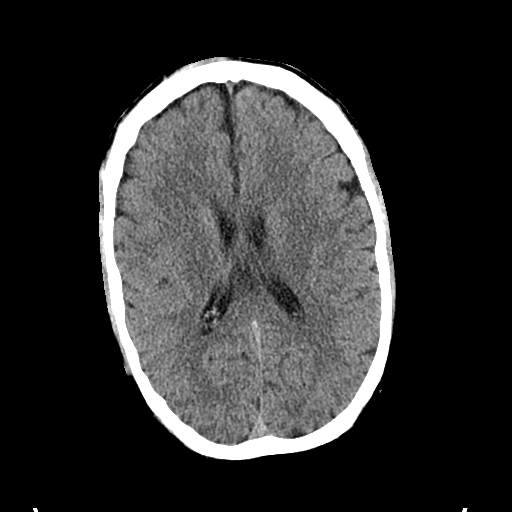
[im 20/36  bone]
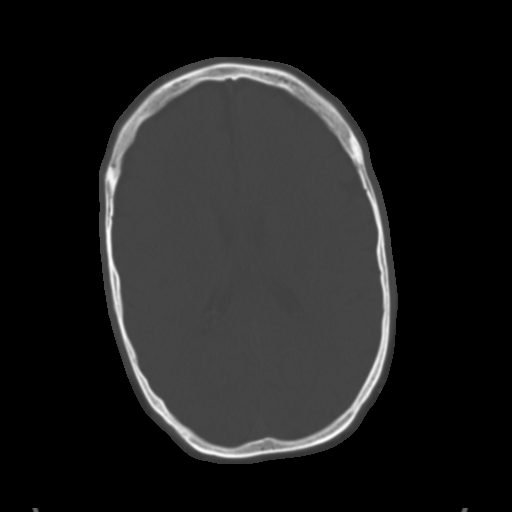
[im 22/36  brain]
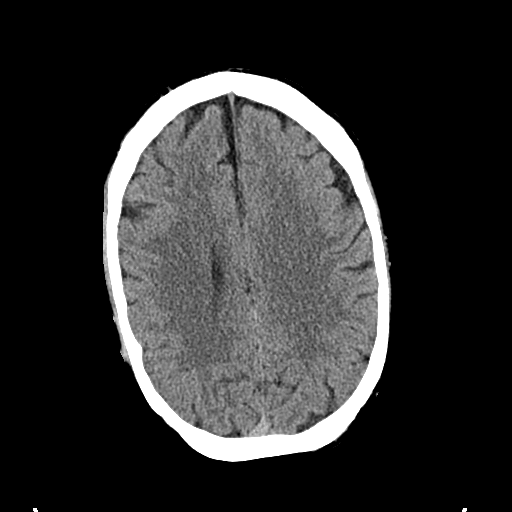
[im 25/36  brain]
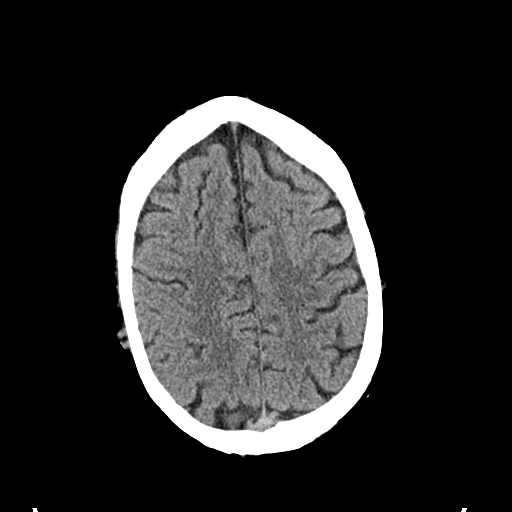
[im 27/36  brain]
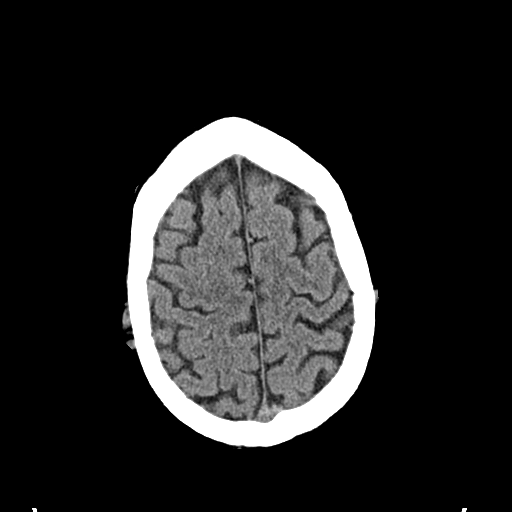
[im 29/36  brain]
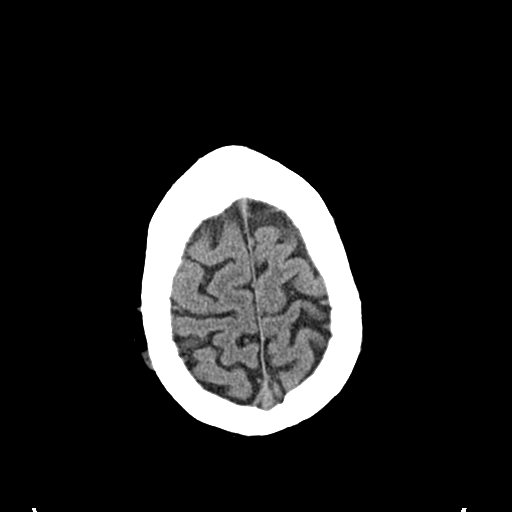
[im 29/36  bone]
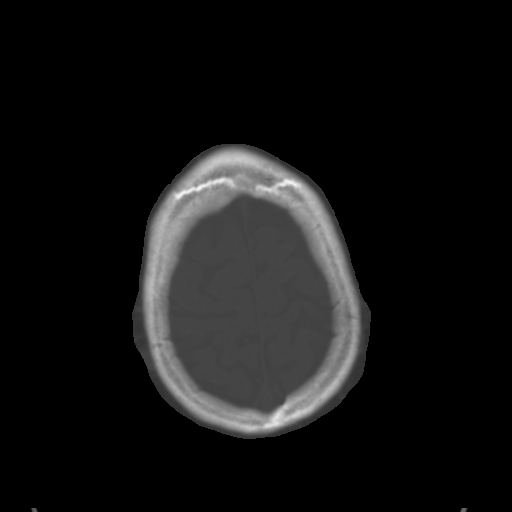
[im 32/36  brain]
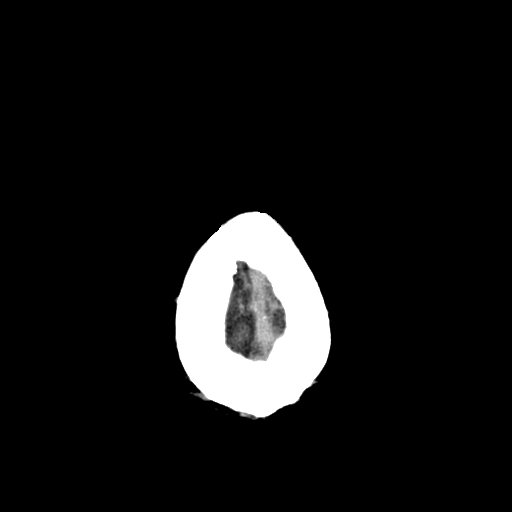
[im 34/36  brain]
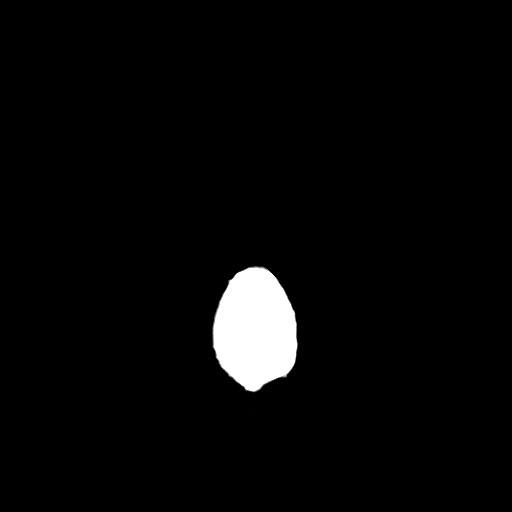

[15 of 30 positions shown; findings below may reference images not displayed]

FINDINGS: There is no evidence of acute infarction, mass lesion, or
intra- or extra-axial hemorrhage on CT.

Apparent foci of air along the cribriform plate are thought to be
within the ethmoid air cells, upon assessment of thin slices.  No
definite pneumocephalus is seen.

The posterior fossa, including the cerebellum, brainstem and fourth
ventricle, is within normal limits.  The third and lateral
ventricles, and basal ganglia are unremarkable in appearance.  The
cerebral hemispheres are symmetric in appearance, with normal gray-
white differentiation.  No mass effect or midline shift is seen.

There is no evidence of fracture; visualized osseous structures are
unremarkable in appearance.  The orbits are within normal limits.
Trace fluid is noted within the maxillary sinuses bilaterally; the
remaining paranasal sinuses and mastoid air cells are well-aerated.
There is a prominent scalp hematoma overlying the frontal
calvarium, tracking along the nose.
IMPRESSION: 1.  No definite evidence of traumatic intracranial injury or
fracture.
2.  Prominent scalp hematoma overlying the frontal calvarium,
tracking along the nose.
3.  Trace fluid noted within the maxillary sinuses bilaterally.

## 2012-01-29 NOTE — ED Notes (Signed)
Patient transported to CT 

## 2012-01-29 NOTE — ED Notes (Signed)
MD at bedside. 

## 2012-01-29 NOTE — ED Notes (Signed)
Pt fell down steps outside landing directly on forehead. Pt has large hematoma to forehead and abrasion to bridge of nose from glasses. Pt denies any LOC

## 2012-01-29 NOTE — ED Provider Notes (Signed)
History     CSN: [id number]  Arrival date & time 01/29/12  0151   First MD Initiated Contact with Patient 01/29/12 0208      Chief Complaint  Patient presents with  . Fall  . Head Injury    (Consider location/radiation/quality/duration/timing/severity/associated sxs/prior treatment) HPI Comments: Mr. Ganaway presents ambulatory for evaluation after a fall. She reports while leaving his daughter's house early this morning he missed a brick step, tripped, and fell forward. He was unable to brace for the fall he struck his head against the hard ground. His glasses were broken during the fall.  He denies any loss of consciousness but states he felt dazed for several seconds.  He reports having a large amount of swelling on his forehead and some bleeding from the bridge of his nose. He reports taking a baby aspirin daily but no other blood thinners or anticoagulants. He denies any other injuries.  Patient is a 56 y.o. male presenting with fall and head injury. The history is provided by the patient. No language interpreter was used.  Fall The accident occurred less than 1 hour ago. The fall occurred while walking. Distance fallen: From standing. He landed on concrete. The volume of blood lost was minimal. The point of impact was the head. The pain is present in the head. The pain is at a severity of 3/10. The pain is mild. He was ambulatory at the scene. There was no entrapment after the fall. There was no drug use involved in the accident. There was no alcohol use involved in the accident. Pertinent negatives include no visual change, no fever, no numbness, no abdominal pain, no bowel incontinence, no nausea, no vomiting, no hematuria, no headaches, no hearing loss, no loss of consciousness and no tingling. He has tried nothing for the symptoms.  Head Injury  Pertinent negatives include no numbness and no vomiting.    Past Medical History  Diagnosis Date  . Pneumonia 1995  . GERD  (gastroesophageal reflux disease)   . Arthritis   . Hyperlipemia   . Hypertension     Past Surgical History  Procedure Date  . Back surgery     02.2011- HE HAD SOME NECK PROBLEMS WITH C7 .  . Carpal tunnel release     both arms  . Tonsillectomy   . Esophagogastroduodenoscopy (egd) with esophageal dilation 01/07/2012    Procedure: ESOPHAGOGASTRODUODENOSCOPY (EGD) WITH ESOPHAGEAL DILATION;  Surgeon: Malissa Hippo, MD;  Location: [facility];  Service: Endoscopy;  Laterality: N/A;  145    No family history on file.  History  Substance Use Topics  . Smoking status: Former Smoker -- 2.0 packs/day for 40 years    Types: Cigarettes    Start date: 03/03/1962    Quit date: 03/04/2007  . Smokeless tobacco: Never Used  . Alcohol Use: Yes     Comment: glass of wine daily      Review of Systems  Constitutional: Negative for fever.  Gastrointestinal: Negative for nausea, vomiting, abdominal pain and bowel incontinence.  Genitourinary: Negative for hematuria.  Neurological: Negative for tingling, loss of consciousness, numbness and headaches.  All other systems reviewed and are negative.    Allergies  Duragen  Home Medications   Current Outpatient Rx  Name  Route  Sig  Dispense  Refill  . ALPRAZOLAM 1 MG PO TABS   Oral   Take 1 mg by mouth at bedtime as needed. Sleep/anxiety.         . AMLODIPINE  BESYLATE 5 MG PO TABS   Oral   Take 5 mg by mouth daily.         . ARMODAFINIL 150 MG PO TABS   Oral   Take 150 mg by mouth daily.         . ASPIRIN EC 81 MG PO TBEC   Oral   Take 81 mg by mouth daily.         . ATORVASTATIN CALCIUM 40 MG PO TABS   Oral   Take 1 tablet (40 mg total) by mouth every evening.   30 tablet   6     Stopped Zocor, changed to Lipitor on 12/26/2011.   Marland Kitchen BUPROPION HCL ER (SR) 150 MG PO TB12   Oral   Take 150 mg by mouth daily.         . DEXLANSOPRAZOLE 60 MG PO CPDR   Oral   Take 60 mg by mouth daily.         Marland Kitchen DICLOFENAC  POTASSIUM 50 MG PO TABS   Oral   Take by mouth daily.         . FUROSEMIDE 20 MG PO TABS   Oral   Take 20 mg by mouth daily.          Marland Kitchen HYDROCODONE-ACETAMINOPHEN 10-325 MG PO TABS   Oral   Take 1 tablet by mouth every 6 (six) hours as needed. Pain.         Marland Kitchen JALYN 0.5-0.4 MG PO CAPS   Oral   Take 1 capsule by mouth Daily.         Marland Kitchen LAMOTRIGINE 25 MG PO TABS   Oral   Take 100 mg by mouth 2 (two) times daily.          Marland Kitchen METOCLOPRAMIDE HCL 5 MG PO TABS   Oral   Take 5 mg by mouth as needed.         . NEBIVOLOL HCL 5 MG PO TABS   Oral   Take 10 mg by mouth daily.          Marland Kitchen OXCARBAZEPINE 600 MG PO TABS   Oral   Take 300 mg by mouth daily.          Marland Kitchen TADALAFIL 20 MG PO TABS   Oral   Take 20 mg by mouth daily as needed. ED.         . TESTOSTERONE CYPIONATE 200 MG/ML IM OIL   Intramuscular   Inject 0.5 mLs into the muscle Once every 2 weeks.          . TRAZODONE HCL 150 MG PO TABS   Oral   Take 1 tablet by mouth at bedtime.          Marland Kitchen VITAMIN D (ERGOCALCIFEROL) 50000 UNITS PO CAPS   Oral   Take 1 capsule by mouth Once a week.         . MELOXICAM 15 MG PO TABS   Oral   Take 0.5 tablets (7.5 mg total) by mouth daily.   30 tablet   5     BP 130/86  Pulse 65  Temp 97.9 F (36.6 C) (Oral)  Resp 18  Ht 5\' 7"  (1.702 m)  Wt 281 lb (127.461 kg)  BMI 44.01 kg/m2  SpO2 97%  Physical Exam  Nursing note and vitals reviewed. Constitutional: He is oriented to person, place, and time. He appears well-developed and well-nourished. No distress.  HENT:  Head: Normocephalic. Head is with  abrasion, with contusion (llarge lower forehead hematoma with overlying abrasion) and with laceration (2 small less than 1 cm lacerations/skin avulsions over the bridge of the nose). Head is without raccoon's eyes, without Battle's sign, without right periorbital erythema and without left periorbital erythema.    Right Ear: External ear normal.  Left Ear:  External ear normal.  Nose: Nose normal.  Mouth/Throat: Oropharynx is clear and moist. No oropharyngeal exudate.  Eyes: Conjunctivae normal are normal. Pupils are equal, round, and reactive to light. Right eye exhibits no discharge. Left eye exhibits no discharge. No scleral icterus.  Neck: Normal range of motion. No JVD present. No tracheal deviation present. No thyromegaly present.       No midline tenderness or step offs  Cardiovascular: Normal rate, regular rhythm, normal heart sounds and intact distal pulses.  Exam reveals no gallop and no friction rub.   No murmur heard. Pulmonary/Chest: Effort normal and breath sounds normal. No stridor. No respiratory distress. He has no wheezes. He has no rales. He exhibits no tenderness.  Abdominal: Soft. Bowel sounds are normal. He exhibits no distension and no mass. There is no tenderness. There is no rebound and no guarding.  Musculoskeletal: Normal range of motion. He exhibits no edema and no tenderness.       No deformities x4 extremities  Lymphadenopathy:    He has no cervical adenopathy.  Neurological: He is alert and oriented to person, place, and time. No cranial nerve deficit. Coordination (Note a steady, confident gait) normal.  Skin: Skin is warm and dry. No rash noted. He is not diaphoretic. No erythema. No pallor.  Psychiatric: He has a normal mood and affect. His behavior is normal.    ED Course  Procedures (including critical care time)  Labs Reviewed - No data to display No results found.   No diagnosis found.    MDM  Patient presents for evaluation after falling and sustaining a head injury. He is currently alert and awake with a steady gait, note stable vital signs, no acute distress. There is a large hematoma on his forehead but no underlying crepitance, depression, or step offs. He also has no facial abrasion and 2 small skin avulsions over the bridge of his nose. I cleaned these wounds and inspected them thoroughly.  Secondary to missing skin and tissue, I will not be able to close him primarily. Because of the mechanism of injury I have ordered a CT scan of the head. Will review the results as available. He has no midline cervical spine or neck tenderness.  1610.  Pt stable, NAD.  No distress of decompensation noted.  CT scan demonstrates a scalp/forehead hematoma but no skull fracture or intercranial injury.  Bacitracin placed over skin avulsions.  Plan discharge home.      Tobin Chad, MD 01/29/12 [phone]

## 2012-02-03 ENCOUNTER — Telehealth (INDEPENDENT_AMBULATORY_CARE_PROVIDER_SITE_OTHER): Payer: Self-pay | Admitting: *Deleted

## 2012-02-03 NOTE — Telephone Encounter (Signed)
To Ann.

## 2012-02-03 NOTE — Telephone Encounter (Signed)
Per Dr.Rehman go ahead a post patient for Colonoscopy Forward to Dewayne Hatch to arrange

## 2012-02-03 NOTE — Telephone Encounter (Signed)
Antonio Lucas is experiencing rectal bleeding again.  If Dr. Karilyn Cota still feels like it is hemorrhoids then it would be huge. He would like to have a TCS and see what it is.  If only hemorrhoid then he would like to get it cut out. Patient's return phone number is [phone] or [phone].

## 2012-02-04 ENCOUNTER — Telehealth (INDEPENDENT_AMBULATORY_CARE_PROVIDER_SITE_OTHER): Payer: Self-pay | Admitting: *Deleted

## 2012-02-04 ENCOUNTER — Other Ambulatory Visit (INDEPENDENT_AMBULATORY_CARE_PROVIDER_SITE_OTHER): Payer: Self-pay | Admitting: *Deleted

## 2012-02-04 DIAGNOSIS — Z1211 Encounter for screening for malignant neoplasm of colon: Secondary | ICD-10-CM

## 2012-02-04 DIAGNOSIS — K625 Hemorrhage of anus and rectum: Secondary | ICD-10-CM

## 2012-02-04 MED ORDER — PEG-KCL-NACL-NASULF-NA ASC-C 100 G PO SOLR: 1.0000 | Freq: Once | ORAL | Status: DC

## 2012-02-04 NOTE — Telephone Encounter (Signed)
TCS sch'd 02/16/12 @ 730, patient aware

## 2012-02-04 NOTE — Telephone Encounter (Signed)
Patient needs movi prep 

## 2012-02-09 ENCOUNTER — Encounter (HOSPITAL_COMMUNITY): Payer: Self-pay | Admitting: Pharmacy Technician

## 2012-02-16 ENCOUNTER — Encounter (INDEPENDENT_AMBULATORY_CARE_PROVIDER_SITE_OTHER): Payer: Self-pay | Admitting: *Deleted

## 2012-02-16 ENCOUNTER — Encounter (HOSPITAL_COMMUNITY)
Admission: RE | Disposition: A | Discharge status: Home / Self Care | Payer: Self-pay | Source: Ambulatory Visit | Attending: Internal Medicine

## 2012-02-16 ENCOUNTER — Ambulatory Visit (HOSPITAL_COMMUNITY)
Admission: RE | Admit: 2012-02-16 | Discharge: 2012-02-16 | Disposition: A | Discharge status: Home / Self Care | Payer: Federal, State, Local not specified - PPO | Source: Ambulatory Visit | Attending: Internal Medicine | Admitting: Internal Medicine

## 2012-02-16 ENCOUNTER — Encounter (HOSPITAL_COMMUNITY): Payer: Self-pay | Admitting: *Deleted

## 2012-02-16 DIAGNOSIS — K625 Hemorrhage of anus and rectum: Secondary | ICD-10-CM

## 2012-02-16 DIAGNOSIS — K5730 Diverticulosis of large intestine without perforation or abscess without bleeding: Secondary | ICD-10-CM

## 2012-02-16 DIAGNOSIS — K648 Other hemorrhoids: Secondary | ICD-10-CM

## 2012-02-16 DIAGNOSIS — K644 Residual hemorrhoidal skin tags: Secondary | ICD-10-CM

## 2012-02-16 DIAGNOSIS — K921 Melena: Secondary | ICD-10-CM | POA: Insufficient documentation

## 2012-02-16 DIAGNOSIS — I10 Essential (primary) hypertension: Secondary | ICD-10-CM | POA: Insufficient documentation

## 2012-02-16 HISTORY — PX: COLONOSCOPY: SHX5424

## 2012-02-16 SURGERY — COLONOSCOPY
Anesthesia: Moderate Sedation

## 2012-02-16 MED ORDER — SODIUM CHLORIDE 0.45 % IV SOLN
INTRAVENOUS | Status: DC
  Administered 2012-02-16: 1000 mL via INTRAVENOUS

## 2012-02-16 MED ORDER — MEPERIDINE HCL 50 MG/ML IJ SOLN
INTRAMUSCULAR | Status: AC
  Filled 2012-02-16: qty 1

## 2012-02-16 MED ORDER — MIDAZOLAM HCL 5 MG/5ML IJ SOLN
INTRAMUSCULAR | Status: DC | PRN
  Administered 2012-02-16: 2 mg via INTRAVENOUS
  Administered 2012-02-16: 3 mg via INTRAVENOUS

## 2012-02-16 MED ORDER — MEPERIDINE HCL 50 MG/ML IJ SOLN
INTRAMUSCULAR | Status: DC | PRN
  Administered 2012-02-16 (×2): 25 mg via INTRAVENOUS

## 2012-02-16 MED ORDER — MIDAZOLAM HCL 5 MG/5ML IJ SOLN
INTRAMUSCULAR | Status: AC
  Filled 2012-02-16: qty 10

## 2012-02-16 MED ORDER — STERILE WATER FOR IRRIGATION IR SOLN
Status: DC | PRN
  Administered 2012-02-16: 08:00:00

## 2012-02-16 NOTE — H&P (Addendum)
Antonio Lucas is an 56 y.o. male.   Chief Complaint: Patient is here for colonoscopy. HPI: Patient is 56 year old Caucasian male who has history of colonic polyps who presents with intermittent rectal bleeding which he describes as large volume hematochezia. He had one episode bleeding occurred without bowel movements. He denies diarrhea constipation or abdominal pain. His last colonoscopy was about 2 years ago. Family history is negative for colorectal carcinoma in first-degree relatives.  Past Medical History  Diagnosis Date  . Pneumonia 1995  . GERD (gastroesophageal reflux disease)   . Arthritis   . Hyperlipemia   . Hypertension     Past Surgical History  Procedure Date  . Back surgery     02.2011- HE HAD SOME NECK PROBLEMS WITH C7 .  . Carpal tunnel release     both arms  . Tonsillectomy   . Esophagogastroduodenoscopy (egd) with esophageal dilation 01/07/2012    Procedure: ESOPHAGOGASTRODUODENOSCOPY (EGD) WITH ESOPHAGEAL DILATION;  Surgeon: Malissa Hippo, MD;  Location: [facility];  Service: Endoscopy;  Laterality: N/A;  145    History reviewed. No pertinent family history. Social History:  reports that he quit smoking about 4 years ago. His smoking use included Cigarettes. He started smoking about 49 years ago. He has a 80 pack-year smoking history. He has never used smokeless tobacco. He reports that he drinks alcohol. He reports that he does not use illicit drugs.  Allergies:  Allergies  Allergen Reactions  . Duragen (Estradiol Valerate)     duragesic patch causes nausea    Medications Prior to Admission  Medication Sig Dispense Refill  . ALPRAZolam (XANAX) 1 MG tablet Take 1 mg by mouth at bedtime as needed. Sleep/anxiety.      Marland Kitchen amLODipine (NORVASC) 5 MG tablet Take 5 mg by mouth daily.      . Armodafinil (NUVIGIL) 150 MG tablet Take 150 mg by mouth daily.      Marland Kitchen aspirin EC 81 MG tablet Take 81 mg by mouth daily.      Marland Kitchen atorvastatin (LIPITOR) 40 MG tablet Take  1 tablet (40 mg total) by mouth every evening.  30 tablet  6  . cyclobenzaprine (FLEXERIL) 10 MG tablet Take 10 mg by mouth 3 (three) times daily as needed. Muscle spasms.      Marland Kitchen dexlansoprazole (DEXILANT) 60 MG capsule Take 60 mg by mouth daily.      . Diclofenac-Misoprostol 75-0.2 MG TBEC Take 1 tablet by mouth 2 (two) times daily as needed. Pain.      . furosemide (LASIX) 20 MG tablet Take 20 mg by mouth daily.       Marland Kitchen HYDROcodone-acetaminophen (NORCO) 10-325 MG per tablet Take 1 tablet by mouth every 6 (six) hours as needed. Pain.      Marland Kitchen JALYN 0.5-0.4 MG CAPS Take 1 capsule by mouth Daily.      Marland Kitchen lamoTRIgine (LAMICTAL) 25 MG tablet Take 100 mg by mouth 2 (two) times daily.       . meloxicam (MOBIC) 15 MG tablet Take 0.5 tablets (7.5 mg total) by mouth daily.  30 tablet  5  . metoCLOPramide (REGLAN) 10 MG tablet Take 10 mg by mouth 2 (two) times daily.      . nebivolol (BYSTOLIC) 5 MG tablet Take 10 mg by mouth daily.       Marland Kitchen oxcarbazepine (TRILEPTAL) 600 MG tablet Take 300 mg by mouth daily.       . peg 3350 powder (MOVIPREP) 100 G SOLR Take  1 kit (100 g total) by mouth once.  1 kit  0  . testosterone cypionate (DEPOTESTOTERONE CYPIONATE) 200 MG/ML injection Inject 0.5 mLs into the muscle Once every 2 weeks.       . traZODone (DESYREL) 150 MG tablet Take 1 tablet by mouth at bedtime.       . Vitamin D, Ergocalciferol, (DRISDOL) 50000 UNITS CAPS Take 1 capsule by mouth Once a week.      . tadalafil (CIALIS) 20 MG tablet Take 20 mg by mouth daily as needed. ED.        No results found for this or any previous visit (from the past 48 hour(s)). No results found.  ROS  Blood pressure 130/83, pulse 57, temperature 98.8 F (37.1 C), temperature source Oral, resp. rate 18, height 5\' 7"  (1.702 m), weight 280 lb (127.007 kg), SpO2 94.00%. Physical Exam  Constitutional: He appears well-developed and well-nourished.  HENT:  Head: Normocephalic and atraumatic.       He has ecchymoses over right   Forehead and  below both eyes  Eyes: Conjunctivae normal are normal. No scleral icterus.  Neck: No thyromegaly present.  Cardiovascular: Normal rate, regular rhythm and normal heart sounds.   No murmur heard. Respiratory: Effort normal and breath sounds normal.  GI: He exhibits no distension and no mass. There is no tenderness.  Musculoskeletal: He exhibits no edema.  Lymphadenopathy:    He has no cervical adenopathy.  Neurological: He is alert.  Skin: Skin is warm and dry.     Assessment/Plan Recurrent large volume hematochezia. History of colonic polyps. Diagnostic colonoscopy.  Jian Hodgman U 02/16/2012, 7:38 AM

## 2012-02-16 NOTE — Op Note (Signed)
COLONOSCOPY PROCEDURE REPORT  PATIENT:  Antonio Lucas  MR#:  [id number] Birthdate:  06/28/1955, 56 y.o., male Endoscopist:  Dr. Malissa Hippo, MD Referred By:  Dr. Josue Hector, MD Procedure Date: 02/16/2012  Procedure:   Colonoscopy  Indications:  Patient is 56 year old Caucasian male with history of colonic polyps who has recurrent large-volume hematochezia.  Informed Consent:  The procedure and risks were reviewed with the patient and informed consent was obtained.  Medications:  Demerol 50 mg IV Versed 5 mg IV  Description of procedure:  After a digital rectal exam was performed, that colonoscope was advanced from the anus through the rectum and colon to the area of the cecum, ileocecal valve and appendiceal orifice. The cecum was deeply intubated. These structures were well-seen and photographed for the record. From the level of the cecum and ileocecal valve, the scope was slowly and cautiously withdrawn. The mucosal surfaces were carefully surveyed utilizing scope tip to flexion to facilitate fold flattening as needed. The scope was pulled down into the rectum where a thorough exam including retroflexion was performed.  Findings:   Prep excellent. Scattered diverticula at descending and sigmoid colon. Focal erythema at distal rectum felt to be secondary to prep. Hemorrhoids noted above and below the dentate line.  Therapeutic/Diagnostic Maneuvers Performed:  None  Complications:  None  Cecal Withdrawal Time:  11 minutes  Impression:  Examination performed to cecum. Scattered diverticula at sigmoid and descending colon. Internal/external hemorrhoids. No evidence of recurrent polyps. He may be bleeding intermittently from hemorrhoids.  Recommendations:  High fiber diet. Patient advised to keep symptom diary until office visit in 4 months.  Briceson Broadwater U  02/16/2012 8:11 AM  CC: Dr. Josue Hector, MD & Dr. Bonnetta Barry ref. provider found

## 2012-02-19 ENCOUNTER — Encounter (HOSPITAL_COMMUNITY): Payer: Self-pay | Admitting: Internal Medicine

## 2012-03-01 ENCOUNTER — Other Ambulatory Visit: Payer: Self-pay | Admitting: Cardiology

## 2012-03-02 LAB — LIPID PANEL
Cholesterol: 212 mg/dL — ABNORMAL HIGH (ref 0–200)
HDL: 65 mg/dL (ref >39)
Total CHOL/HDL Ratio: 3.3 Ratio
Triglycerides: 141 mg/dL (ref <150)
VLDL: 28 mg/dL (ref 0–40)

## 2012-03-02 LAB — HEPATIC FUNCTION PANEL
ALT: 21 U/L (ref 0–53)
AST: 15 U/L (ref 0–37)

## 2012-03-08 ENCOUNTER — Ambulatory Visit (INDEPENDENT_AMBULATORY_CARE_PROVIDER_SITE_OTHER): Payer: Federal, State, Local not specified - PPO | Admitting: Internal Medicine

## 2012-03-09 ENCOUNTER — Telehealth: Payer: Self-pay | Admitting: *Deleted

## 2012-03-09 NOTE — Telephone Encounter (Signed)
Message copied by Lesle Chris on Tue Mar 09, 2012 10:34 AM ------      Message from: Eustace Moore      Created: Fri Mar 05, 2012  1:57 PM                   ----- Message -----         From: Jacqlyn Krauss, RN         Sent: 03/04/2012   7:19 AM           To: Eustace Moore, LPN            [facility] patient      ----- Message -----         From: Laurey Morale, MD         Sent: 03/03/2012  11:24 PM           To: Jacqlyn Krauss, RN            Make sure he is taking atorvastatin 40.  I do not think that I will increase the dose if he is taking it.

## 2012-03-09 NOTE — Telephone Encounter (Signed)
Notes Recorded by Lesle Chris, LPN on 07/05/979 at 10:34 AM Patient notified. Yes, he is taking the Atorvastatin 40mg  every evening. Notes Recorded by Lesle Chris, LPN on 03/11/1476 at 10:52 AM Left message to return call.

## 2012-04-17 ENCOUNTER — Other Ambulatory Visit: Payer: Self-pay

## 2012-06-15 ENCOUNTER — Ambulatory Visit (INDEPENDENT_AMBULATORY_CARE_PROVIDER_SITE_OTHER): Payer: Federal, State, Local not specified - PPO | Admitting: Internal Medicine

## 2012-10-23 ENCOUNTER — Other Ambulatory Visit: Payer: Self-pay | Admitting: Cardiology

## 2012-10-29 ENCOUNTER — Other Ambulatory Visit: Payer: Self-pay | Admitting: Cardiology

## 2012-11-09 ENCOUNTER — Other Ambulatory Visit: Payer: Self-pay | Admitting: Cardiology

## 2012-11-13 ENCOUNTER — Encounter (HOSPITAL_COMMUNITY): Payer: Self-pay | Admitting: Emergency Medicine

## 2012-11-13 ENCOUNTER — Emergency Department (HOSPITAL_COMMUNITY)
Admission: EM | Admit: 2012-11-13 | Discharge: 2012-11-13 | Disposition: A | Discharge status: Home / Self Care | Payer: Federal, State, Local not specified - PPO | Attending: Emergency Medicine | Admitting: Emergency Medicine

## 2012-11-13 ENCOUNTER — Emergency Department (HOSPITAL_COMMUNITY): Payer: Federal, State, Local not specified - PPO

## 2012-11-13 DIAGNOSIS — Z8701 Personal history of pneumonia (recurrent): Secondary | ICD-10-CM | POA: Insufficient documentation

## 2012-11-13 DIAGNOSIS — K219 Gastro-esophageal reflux disease without esophagitis: Secondary | ICD-10-CM | POA: Insufficient documentation

## 2012-11-13 DIAGNOSIS — R339 Retention of urine, unspecified: Secondary | ICD-10-CM | POA: Insufficient documentation

## 2012-11-13 DIAGNOSIS — Z7982 Long term (current) use of aspirin: Secondary | ICD-10-CM | POA: Insufficient documentation

## 2012-11-13 DIAGNOSIS — R300 Dysuria: Secondary | ICD-10-CM | POA: Insufficient documentation

## 2012-11-13 DIAGNOSIS — Z791 Long term (current) use of non-steroidal anti-inflammatories (NSAID): Secondary | ICD-10-CM | POA: Insufficient documentation

## 2012-11-13 DIAGNOSIS — E785 Hyperlipidemia, unspecified: Secondary | ICD-10-CM | POA: Insufficient documentation

## 2012-11-13 DIAGNOSIS — Z87891 Personal history of nicotine dependence: Secondary | ICD-10-CM | POA: Insufficient documentation

## 2012-11-13 DIAGNOSIS — Z79899 Other long term (current) drug therapy: Secondary | ICD-10-CM | POA: Insufficient documentation

## 2012-11-13 DIAGNOSIS — M129 Arthropathy, unspecified: Secondary | ICD-10-CM | POA: Insufficient documentation

## 2012-11-13 DIAGNOSIS — I10 Essential (primary) hypertension: Secondary | ICD-10-CM | POA: Insufficient documentation

## 2012-11-13 LAB — URINALYSIS, ROUTINE W REFLEX MICROSCOPIC
Bilirubin Urine: NEGATIVE
Hgb urine dipstick: NEGATIVE
Nitrite: NEGATIVE
Specific Gravity, Urine: 1.015 (ref 1.005–1.030)
pH: 6 (ref 5.0–8.0)

## 2012-11-13 LAB — CBC WITH DIFFERENTIAL/PLATELET
Eosinophils Absolute: 0.3 10*3/uL (ref 0.0–0.7)
Hemoglobin: 12.3 g/dL — ABNORMAL LOW (ref 13.0–17.0)
Lymphocytes Relative: 18 % (ref 12–46)
Lymphs Abs: 1.4 10*3/uL (ref 0.7–4.0)
MCH: 31.4 pg (ref 26.0–34.0)
MCV: 93.4 fL (ref 78.0–100.0)
Monocytes Relative: 11 % (ref 3–12)
Neutrophils Relative %: 67 % (ref 43–77)
RBC: 3.92 MIL/uL — ABNORMAL LOW (ref 4.22–5.81)

## 2012-11-13 LAB — BASIC METABOLIC PANEL
BUN: 14 mg/dL (ref 6–23)
CO2: 27 mEq/L (ref 19–32)
GFR calc non Af Amer: >90 mL/min (ref >90)
Glucose, Bld: 166 mg/dL — ABNORMAL HIGH (ref 70–99)
Potassium: 3.7 mEq/L (ref 3.5–5.1)

## 2012-11-13 IMAGING — CT CT L SPINE W/O CM
3 series · 14 of 33 positions shown, 17 images · non-contrast
Comparison: Lumbar spine MRI [DATE]

CLINICAL DATA: Urinary retention. Back pain.

EXAM:
CT LUMBAR SPINE WITHOUT CONTRAST
TECHNIQUE: Multidetector CT imaging of the lumbar spine was performed without
intravenous contrast administration. Multiplanar CT image
reconstructions were also generated.

[Series 3: lumbar spine 2.0 b30s · axial · 0.46mm/px · z∈[+360,+580]mm · 6 of 143 slices shown, 8 images]
[im 22/143  soft-tissue]
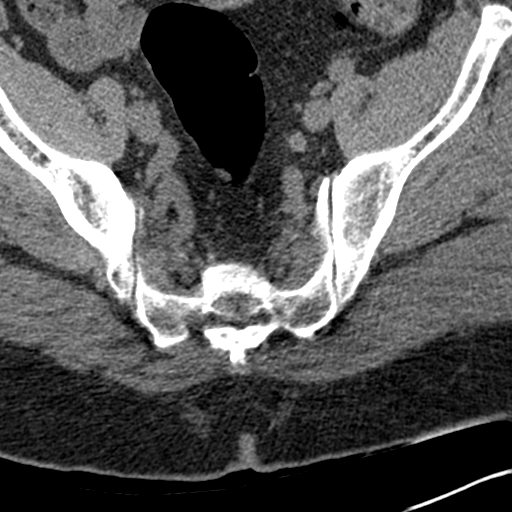
[im 22/143  bone]
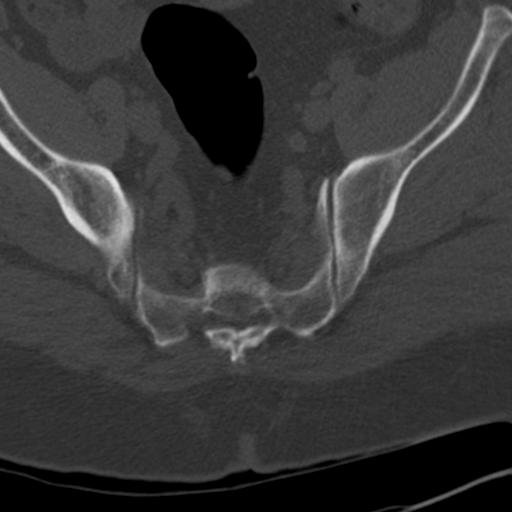
[im 44/143  bone]
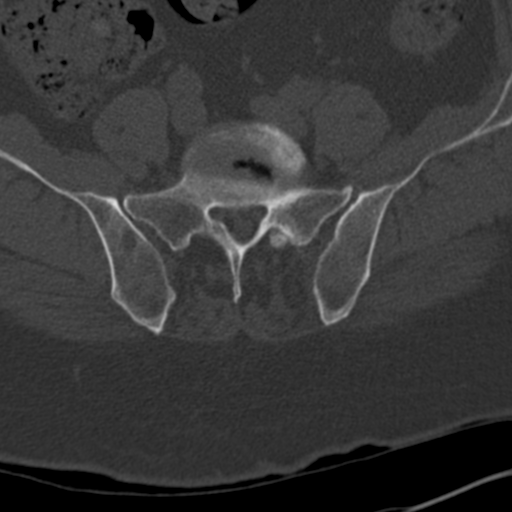
[im 66/143  bone]
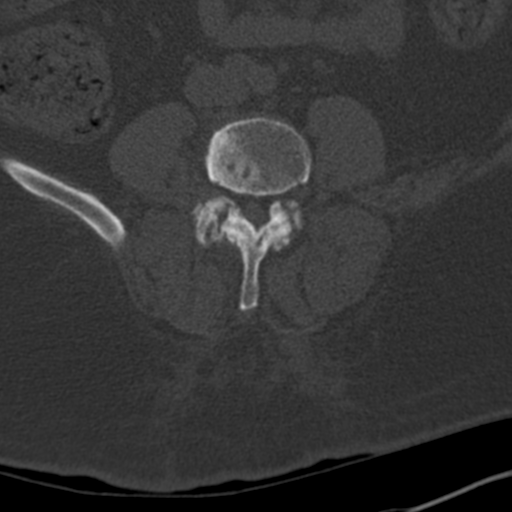
[im 88/143  bone]
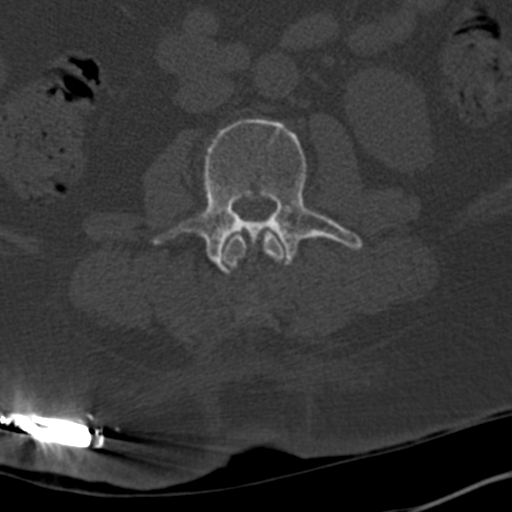
[im 110/143  soft-tissue]
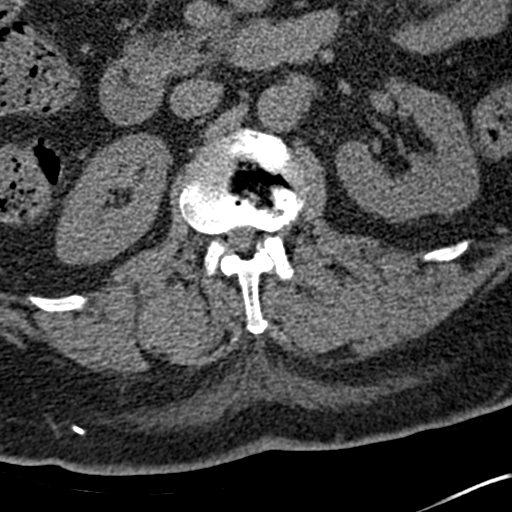
[im 110/143  bone]
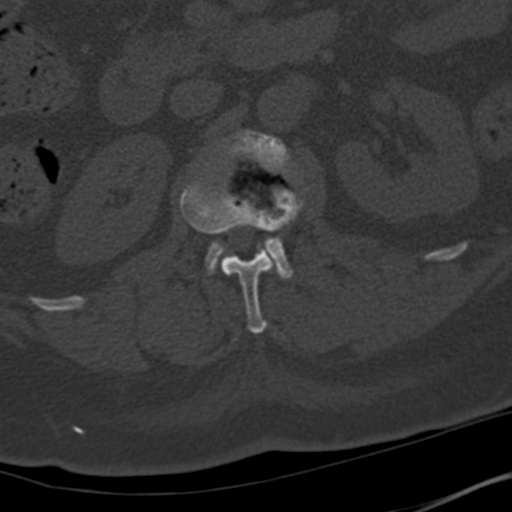
[im 132/143  bone]
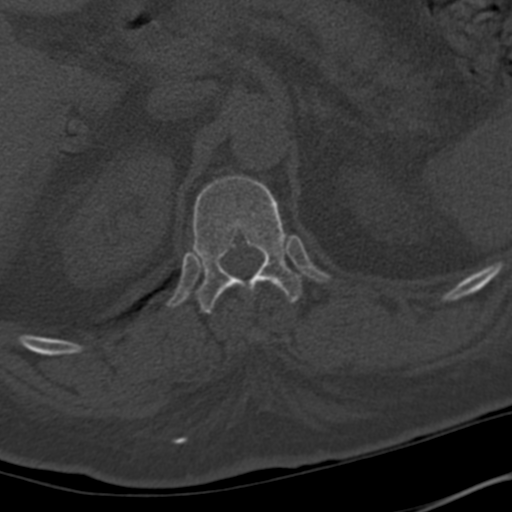

[Series 4: lumbar spine 2.0 spo cor · coronal · 0.46mm/px · 3 of 117 slices shown]
[im 24/117  bone]
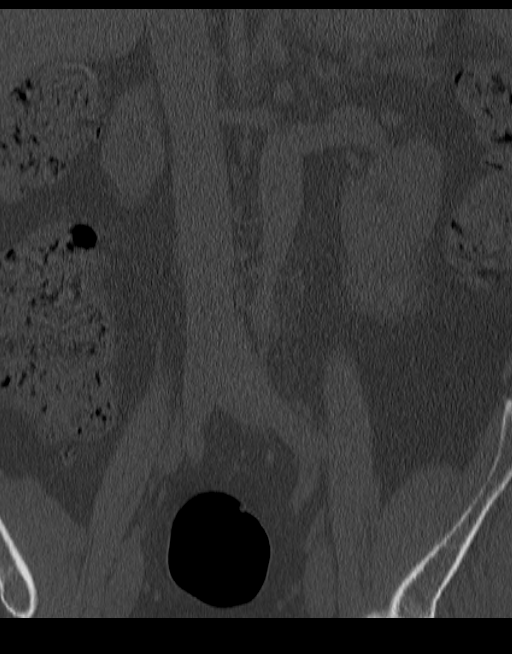
[im 47/117  bone]
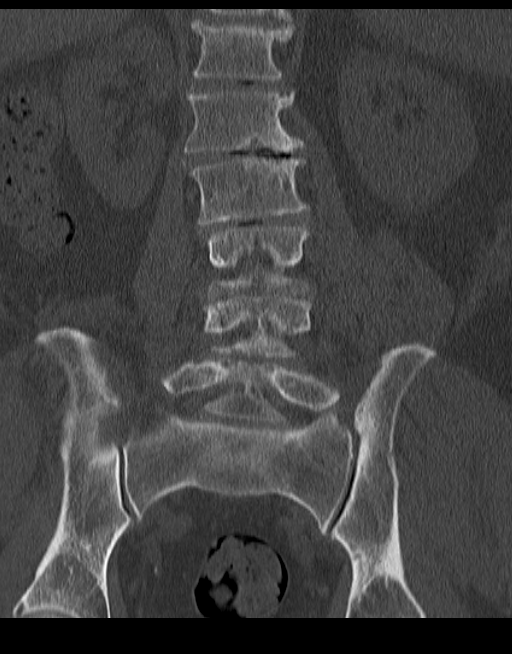
[im 70/117  bone]
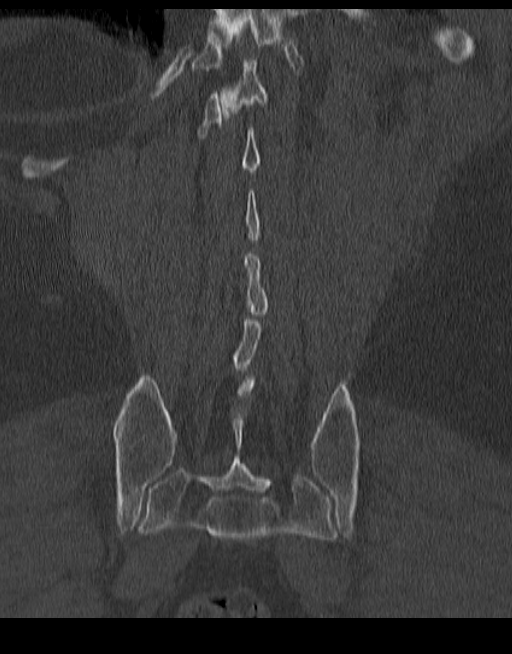

[Series 5: lumbar spine 2.0 spo · sagittal · 0.44mm/px · 5 of 121 slices shown, 6 images]
[im 41/121  bone]
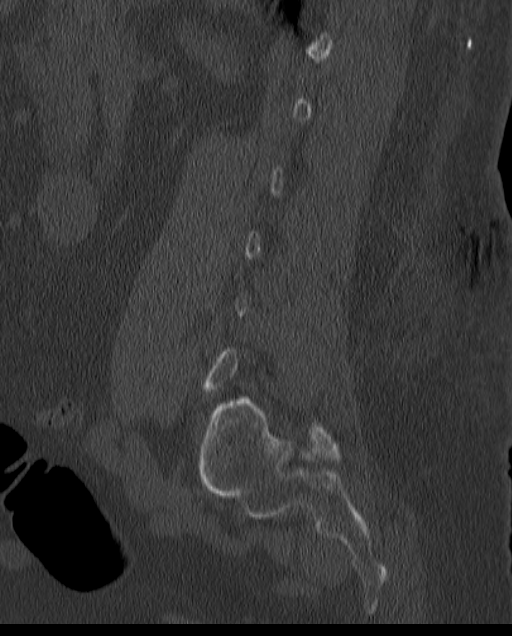
[im 51/121  bone]
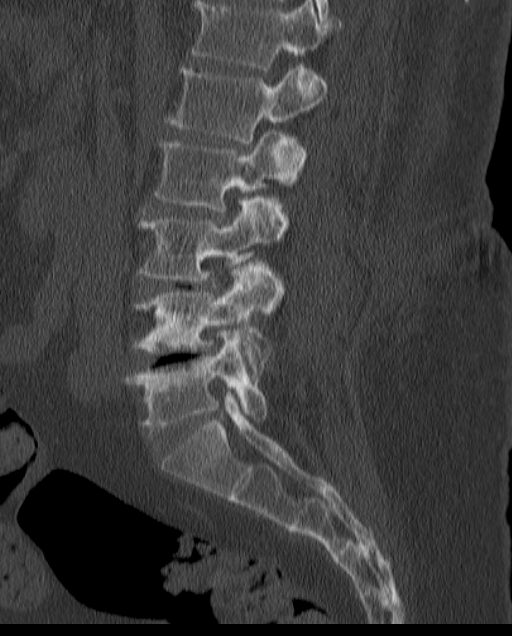
[im 61/121  soft-tissue]
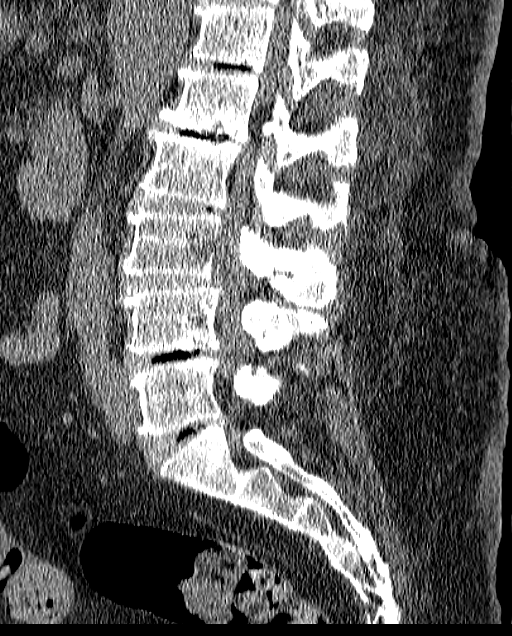
[im 61/121  bone]
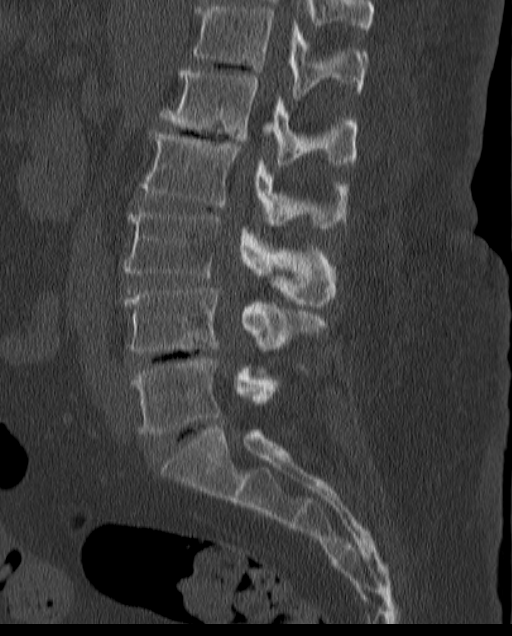
[im 71/121  bone]
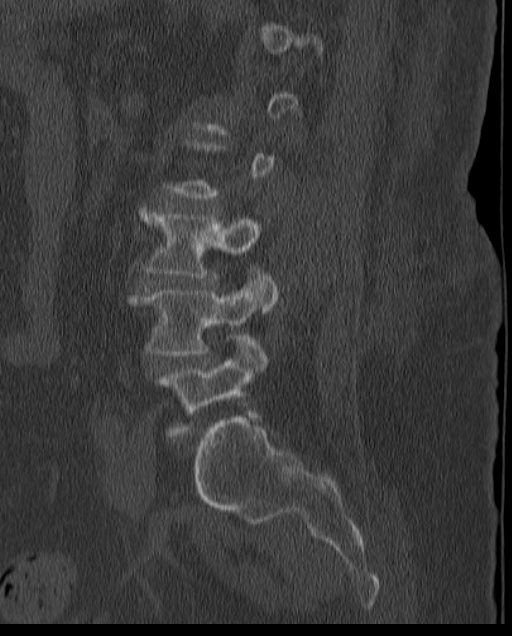
[im 81/121  bone]
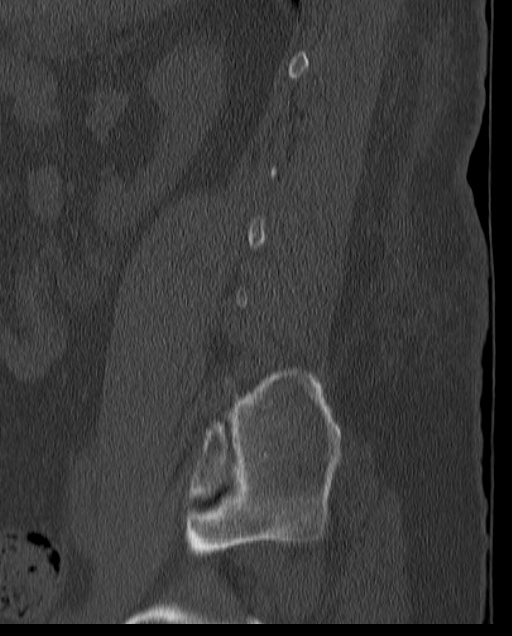

[14 of 33 positions shown; findings below may reference images not displayed]

FINDINGS: Negative for acute fracture or suspected traumatic subluxation. No
aggressive appearing osseous erosion to suggest osseous infection.
No perivertebral edema.

Severe degenerative disc and facet disease:

L1-2: Severe disc narrowing with trace retrolisthesis, narrowing
both foramina.

L2-3: Degenerative disc space narrowing; facet osteoarthritis with
sclerosis. Disc narrowing and facet spurs encroach on both foramina.

L3-4: Mild anterolisthesis related to advanced facet osteoarthritis.
There is disk narrowing and endplate spurring narrowing the foramen.
Canal stenosis present. There has been previous left-sided
laminotomy.

L4-5: Advanced degenerative disc disease with endplate spurs
contributing to advanced foraminal stenosis when combined with disc
narrowing and facet spurs. There may have been pars defects
bilaterally at this level. Probable bilateral laminotomy.

L5-S1: Advanced degenerative disc narrowing with endplate and facet
spurs resulting in left more than right foraminal stenosis. Spinal
canal stenosis may be present. Status post left-sided laminotomy.

There is a spinal stimulator battery pack in the right gluteal
region, with surrounding subcutaneous gas related to recent
placement. The wiring is extra-spinal at the imaged levels.
IMPRESSION: 1.  No evidence of acute osseous abnormality.

2. Advanced degenerative disc and facet disease throughout the
lumbar spine, with multilevel canal and foraminal stenoses.

## 2012-11-13 NOTE — ED Notes (Signed)
Pt c/o urinary retention since having neurostimulator.

## 2012-11-13 NOTE — ED Notes (Signed)
Placed leg bag on patient and explained how to drain urine to wife. Also gave her a foley bag to use at night. Explained to her how to change over to foley bag. She was able to repeat and verbalize understanding

## 2012-11-13 NOTE — ED Provider Notes (Addendum)
CSN: [id number]     Arrival date & time 11/13/12  0351 History   First MD Initiated Contact with Patient 11/13/12 [phone]     Chief Complaint  Patient presents with  . Urinary Retention   (Consider location/radiation/quality/duration/timing/severity/associated sxs/prior Treatment) HPI 57 year old male had a neurostimulator placed in his lower back 2 days ago. Since then, he has been having difficulty urinating. He has only urinated twice and feels like he has to go all the time. He denies fever, chills, sweats. His wife states that he did have a bladder scan before he left the hospital and he did not have problems if you urinary retention at that time. He has not been able to sleep because of the discomfort. He has been taking hydrocodone-acetaminophen for pain. He denies any constipation denies problems with urination prior to the last 2 days. Past Medical History  Diagnosis Date  . Pneumonia 1995  . GERD (gastroesophageal reflux disease)   . Arthritis   . Hyperlipemia   . Hypertension    Past Surgical History  Procedure Laterality Date  . Back surgery      02.2011- HE HAD SOME NECK PROBLEMS WITH C7 .  . Carpal tunnel release      both arms  . Tonsillectomy    . Esophagogastroduodenoscopy (egd) with esophageal dilation  01/07/2012    Procedure: ESOPHAGOGASTRODUODENOSCOPY (EGD) WITH ESOPHAGEAL DILATION;  Surgeon: Malissa Hippo, MD;  Location: [facility];  Service: Endoscopy;  Laterality: N/A;  145  . Colonoscopy  02/16/2012    Procedure: COLONOSCOPY;  Surgeon: Malissa Hippo, MD;  Location: [facility];  Service: Endoscopy;  Laterality: N/A;  730   History reviewed. No pertinent family history. History  Substance Use Topics  . Smoking status: Former Smoker -- 2.00 packs/day for 40 years    Types: Cigarettes    Start date: 03/03/1962    Quit date: 03/04/2007  . Smokeless tobacco: Never Used  . Alcohol Use: Yes     Comment: glass of wine daily    Review of  Systems  Allergies  Fentanyl  Home Medications   Current Outpatient Rx  Name  Route  Sig  Dispense  Refill  . ALPRAZolam (XANAX) 1 MG tablet   Oral   Take 1 mg by mouth at bedtime as needed. Sleep/anxiety.         Marland Kitchen amLODipine (NORVASC) 5 MG tablet   Oral   Take 5 mg by mouth daily.         . Armodafinil (NUVIGIL) 150 MG tablet   Oral   Take 150 mg by mouth daily.         Marland Kitchen aspirin EC 81 MG tablet   Oral   Take 81 mg by mouth daily.         Marland Kitchen atorvastatin (LIPITOR) 40 MG tablet      TAKE 1 TABLET BY MOUTH EVERY DAY   30 tablet   1     PATIENT NEEDS TO CALL OUR OFFICE TO SCHEDULE FOLLO ...   . cyclobenzaprine (FLEXERIL) 10 MG tablet   Oral   Take 10 mg by mouth 3 (three) times daily as needed. Muscle spasms.         Marland Kitchen dexlansoprazole (DEXILANT) 60 MG capsule   Oral   Take 60 mg by mouth daily.         . Diclofenac-Misoprostol 75-0.2 MG TBEC   Oral   Take 1 tablet by mouth 2 (two) times daily as needed.  Pain.         . furosemide (LASIX) 20 MG tablet   Oral   Take 20 mg by mouth daily.          Marland Kitchen HYDROcodone-acetaminophen (NORCO) 10-325 MG per tablet   Oral   Take 1 tablet by mouth every 6 (six) hours as needed. Pain.         Marland Kitchen JALYN 0.5-0.4 MG CAPS   Oral   Take 1 capsule by mouth Daily.         Marland Kitchen lamoTRIgine (LAMICTAL) 25 MG tablet   Oral   Take 100 mg by mouth 2 (two) times daily.          . meloxicam (MOBIC) 15 MG tablet   Oral   Take 0.5 tablets (7.5 mg total) by mouth daily.   30 tablet   5   . metoCLOPramide (REGLAN) 10 MG tablet   Oral   Take 10 mg by mouth 2 (two) times daily.         . nebivolol (BYSTOLIC) 5 MG tablet   Oral   Take 10 mg by mouth daily.          Marland Kitchen oxcarbazepine (TRILEPTAL) 600 MG tablet   Oral   Take 300 mg by mouth daily.          Marland Kitchen oxymorphone (OPANA ER) 15 MG 12 hr tablet   Oral   Take 15 mg by mouth every 12 (twelve) hours.         . tadalafil (CIALIS) 20 MG tablet   Oral    Take 20 mg by mouth daily as needed. ED.         Marland Kitchen testosterone cypionate (DEPOTESTOTERONE CYPIONATE) 200 MG/ML injection   Intramuscular   Inject 0.5 mLs into the muscle Once every 2 weeks.          . traZODone (DESYREL) 150 MG tablet   Oral   Take 1 tablet by mouth at bedtime.          . Vitamin D, Ergocalciferol, (DRISDOL) 50000 UNITS CAPS   Oral   Take 1 capsule by mouth Once a week.         Marland Kitchen atorvastatin (LIPITOR) 40 MG tablet      TAKE 1 TABLET BY MOUTH EVERY DAY   30 tablet   1     Patient needs to schedule follow up visit.   Marland Kitchen atorvastatin (LIPITOR) 40 MG tablet      TAKE 1 TABLET BY MOUTH EVERY DAY   30 tablet   6    BP 104/60  Temp(Src) 97.8 F (36.6 C) (Oral)  Resp 20  Ht 5\' 7"  (1.702 m)  Wt 270 lb (122.471 kg)  BMI 42.28 kg/m2  SpO2 95% Physical Exam 57 year old male, resting comfortably and in no acute distress. He is sleepy but arousable and is oriented when aroused. Vital signs are normal. Oxygen saturation is 95%, which is normal. Head is normocephalic and atraumatic. PERRLA, EOMI. Oropharynx is clear. Neck is nontender and supple without adenopathy or JVD. Back is nontender and there is no CVA tenderness. Lungs are clear without rales, wheezes, or rhonchi. Chest is nontender. Heart has regular rate and rhythm without murmur. Abdomen is soft, flat, nontender without masses or hepatosplenomegaly and peristalsis is normoactive. Extremities have 2+ edema, full range of motion is present. Moderate venous stasis changes are present. Skin is warm and dry without rash. Neurologic: Mental status is normal, cranial nerves are intact,  there are no motor or sensory deficits.  ED Course  Procedures (including critical care time) Labs Review Results for orders placed during the hospital encounter of 11/13/12  URINALYSIS, ROUTINE W REFLEX MICROSCOPIC      Result Value Range   Color, Urine YELLOW  YELLOW   APPearance CLEAR  CLEAR   Specific Gravity,  Urine 1.015  1.005 - 1.030   pH 6.0  5.0 - 8.0   Glucose, UA NEGATIVE  NEGATIVE mg/dL   Hgb urine dipstick NEGATIVE  NEGATIVE   Bilirubin Urine NEGATIVE  NEGATIVE   Ketones, ur NEGATIVE  NEGATIVE mg/dL   Protein, ur NEGATIVE  NEGATIVE mg/dL   Urobilinogen, UA 0.2  0.0 - 1.0 mg/dL   Nitrite NEGATIVE  NEGATIVE   Leukocytes, UA NEGATIVE  NEGATIVE  CBC WITH DIFFERENTIAL      Result Value Range   WBC 7.9  4.0 - 10.5 K/uL   RBC 3.92 (*) 4.22 - 5.81 MIL/uL   Hemoglobin 12.3 (*) 13.0 - 17.0 g/dL   HCT 16.1 (*) 09.6 - 04.5 %   MCV 93.4  78.0 - 100.0 fL   MCH 31.4  26.0 - 34.0 pg   MCHC 33.6  30.0 - 36.0 g/dL   RDW 40.9  81.1 - 91.4 %   Platelets 196  150 - 400 K/uL   Neutrophils Relative % 67  43 - 77 %   Neutro Abs 5.3  1.7 - 7.7 K/uL   Lymphocytes Relative 18  12 - 46 %   Lymphs Abs 1.4  0.7 - 4.0 K/uL   Monocytes Relative 11  3 - 12 %   Monocytes Absolute 0.8  0.1 - 1.0 K/uL   Eosinophils Relative 4  0 - 5 %   Eosinophils Absolute 0.3  0.0 - 0.7 K/uL   Basophils Relative 1  0 - 1 %   Basophils Absolute 0.0  0.0 - 0.1 K/uL  BASIC METABOLIC PANEL      Result Value Range   Sodium 133 (*) 135 - 145 mEq/L   Potassium 3.7  3.5 - 5.1 mEq/L   Chloride 98  96 - 112 mEq/L   CO2 27  19 - 32 mEq/L   Glucose, Bld 166 (*) 70 - 99 mg/dL   BUN 14  6 - 23 mg/dL   Creatinine, Ser 7.82  0.50 - 1.35 mg/dL   Calcium 9.2  8.4 - 95.6 mg/dL   GFR calc non Af Amer >90  >90 mL/min   GFR calc Af Amer >90  >90 mL/min   Imaging Review Ct Lumbar Spine Wo Contrast  11/13/2012   CLINICAL DATA:  Urinary retention. Back pain.  EXAM: CT LUMBAR SPINE WITHOUT CONTRAST  TECHNIQUE: Multidetector CT imaging of the lumbar spine was performed without intravenous contrast administration. Multiplanar CT image reconstructions were also generated.  COMPARISON:  Lumbar spine MRI 12/22/2011  FINDINGS: Negative for acute fracture or suspected traumatic subluxation. No aggressive appearing osseous erosion to suggest osseous  infection. No perivertebral edema.  Severe degenerative disc and facet disease:  L1-2: Severe disc narrowing with trace retrolisthesis, narrowing both foramina.  L2-3: Degenerative disc space narrowing; facet osteoarthritis with sclerosis. Disc narrowing and facet spurs encroach on both foramina.  L3-4: Mild anterolisthesis related to advanced facet osteoarthritis. There is disk narrowing and endplate spurring narrowing the foramen. Canal stenosis present. There has been previous left-sided laminotomy.  L4-5: Advanced degenerative disc disease with endplate spurs contributing to advanced foraminal stenosis when combined with disc narrowing  and facet spurs. There may have been pars defects bilaterally at this level. Probable bilateral laminotomy.  L5-S1: Advanced degenerative disc narrowing with endplate and facet spurs resulting in left more than right foraminal stenosis. Spinal canal stenosis may be present. Status post left-sided laminotomy.  There is a spinal stimulator battery pack in the right gluteal region, with surrounding subcutaneous gas related to recent placement. The wiring is extra-spinal at the imaged levels.  IMPRESSION: 1.  No evidence of acute osseous abnormality.  2. 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   Electronically Signed   By: Tiburcio Pea   On: 11/13/2012 05:37    MDM   1. Urinary retention    Acute urinary retention. Foley catheter was placed and only yielded about 500 mL of urine. His wife expresses surprise that there was such a small amount of urine considering that he has drank during the last 2 days. Metabolic panel will be checked to make sure he is not having renal insufficiency. I am also concerned about developing urinary retention following a spine procedure so he'll be sent for CT scan to make sure that he is not having cauda equina syndrome causing his retention. Foley catheter is  placed by RN.  Workup is unremarkable. CT scan shows advanced degenerative changes with foraminal and canal stenosis of a probably not sufficient to account for urinary retention. Renal function is normal. Since he cannot get in to see his physician for the next 2 days, it was decided to leave the Foley catheter in place until then.  Dione Booze, MD 11/13/12 1308  Dione Booze, MD 11/22/12 [phone]

## 2012-11-14 ENCOUNTER — Emergency Department (HOSPITAL_COMMUNITY): Payer: Federal, State, Local not specified - PPO

## 2012-11-14 ENCOUNTER — Encounter (HOSPITAL_COMMUNITY): Payer: Self-pay | Admitting: *Deleted

## 2012-11-14 ENCOUNTER — Emergency Department (HOSPITAL_COMMUNITY)
Admission: EM | Admit: 2012-11-14 | Discharge: 2012-11-14 | Disposition: A | Discharge status: Home / Self Care | Payer: Federal, State, Local not specified - PPO | Attending: Emergency Medicine | Admitting: Emergency Medicine

## 2012-11-14 DIAGNOSIS — I10 Essential (primary) hypertension: Secondary | ICD-10-CM | POA: Insufficient documentation

## 2012-11-14 DIAGNOSIS — E785 Hyperlipidemia, unspecified: Secondary | ICD-10-CM | POA: Insufficient documentation

## 2012-11-14 DIAGNOSIS — Z8701 Personal history of pneumonia (recurrent): Secondary | ICD-10-CM | POA: Insufficient documentation

## 2012-11-14 DIAGNOSIS — M549 Dorsalgia, unspecified: Secondary | ICD-10-CM | POA: Insufficient documentation

## 2012-11-14 DIAGNOSIS — R1084 Generalized abdominal pain: Secondary | ICD-10-CM | POA: Insufficient documentation

## 2012-11-14 DIAGNOSIS — Z79899 Other long term (current) drug therapy: Secondary | ICD-10-CM | POA: Insufficient documentation

## 2012-11-14 DIAGNOSIS — M129 Arthropathy, unspecified: Secondary | ICD-10-CM | POA: Insufficient documentation

## 2012-11-14 DIAGNOSIS — Z7982 Long term (current) use of aspirin: Secondary | ICD-10-CM | POA: Insufficient documentation

## 2012-11-14 DIAGNOSIS — R109 Unspecified abdominal pain: Secondary | ICD-10-CM

## 2012-11-14 DIAGNOSIS — Z87891 Personal history of nicotine dependence: Secondary | ICD-10-CM | POA: Insufficient documentation

## 2012-11-14 DIAGNOSIS — R071 Chest pain on breathing: Secondary | ICD-10-CM | POA: Insufficient documentation

## 2012-11-14 DIAGNOSIS — K219 Gastro-esophageal reflux disease without esophagitis: Secondary | ICD-10-CM | POA: Insufficient documentation

## 2012-11-14 LAB — CBC WITH DIFFERENTIAL/PLATELET
Basophils Absolute: 0 10*3/uL (ref 0.0–0.1)
Basophils Relative: 0 % (ref 0–1)
Eosinophils Absolute: 0.2 10*3/uL (ref 0.0–0.7)
Eosinophils Relative: 3 % (ref 0–5)
HCT: 35.6 % — ABNORMAL LOW (ref 39.0–52.0)
Hemoglobin: 11.8 g/dL — ABNORMAL LOW (ref 13.0–17.0)
Lymphocytes Relative: 15 % (ref 12–46)
Lymphs Abs: 1.1 10*3/uL (ref 0.7–4.0)
MCH: 30.9 pg (ref 26.0–34.0)
MCHC: 33.1 g/dL (ref 30.0–36.0)
MCV: 93.2 fL (ref 78.0–100.0)
Monocytes Absolute: 0.7 10*3/uL (ref 0.1–1.0)
Monocytes Relative: 10 % (ref 3–12)
Neutro Abs: 5.2 10*3/uL (ref 1.7–7.7)
Neutrophils Relative %: 72 % (ref 43–77)
Platelets: 206 10*3/uL (ref 150–400)
RBC: 3.82 MIL/uL — ABNORMAL LOW (ref 4.22–5.81)
RDW: 13.4 % (ref 11.5–15.5)
WBC: 7.3 10*3/uL (ref 4.0–10.5)

## 2012-11-14 LAB — URINE MICROSCOPIC-ADD ON

## 2012-11-14 LAB — URINALYSIS, ROUTINE W REFLEX MICROSCOPIC
Bilirubin Urine: NEGATIVE
Glucose, UA: NEGATIVE mg/dL
Leukocytes, UA: NEGATIVE
Nitrite: NEGATIVE
Protein, ur: NEGATIVE mg/dL
Specific Gravity, Urine: >1.03 — ABNORMAL HIGH (ref 1.005–1.030)
Urobilinogen, UA: 0.2 mg/dL (ref 0.0–1.0)
pH: 6 (ref 5.0–8.0)

## 2012-11-14 LAB — COMPREHENSIVE METABOLIC PANEL
ALT: 12 U/L (ref 0–53)
AST: 15 U/L (ref 0–37)
Albumin: 2.9 g/dL — ABNORMAL LOW (ref 3.5–5.2)
Alkaline Phosphatase: 80 U/L (ref 39–117)
BUN: 15 mg/dL (ref 6–23)
CO2: 30 mEq/L (ref 19–32)
Calcium: 8.9 mg/dL (ref 8.4–10.5)
Chloride: 101 mEq/L (ref 96–112)
Creatinine, Ser: 0.97 mg/dL (ref 0.50–1.35)
GFR calc Af Amer: >90 mL/min (ref >90)
GFR calc non Af Amer: 89 mL/min — ABNORMAL LOW (ref >90)
Glucose, Bld: 128 mg/dL — ABNORMAL HIGH (ref 70–99)
Potassium: 3.8 mEq/L (ref 3.5–5.1)
Sodium: 135 mEq/L (ref 135–145)
Total Bilirubin: 0.4 mg/dL (ref 0.3–1.2)
Total Protein: 5.9 g/dL — ABNORMAL LOW (ref 6.0–8.3)

## 2012-11-14 LAB — LIPASE, BLOOD: Lipase: 36 U/L (ref 11–59)

## 2012-11-14 IMAGING — US US ABDOMEN LIMITED
1 series · 14 of 25 positions shown · non-contrast
Comparison: Lumbar spine CT [DATE]. Thoracic MRI [DATE].  .

CLINICAL DATA: 56-year-old male with right upper quadrant pain.

EXAM:
US ABDOMEN LIMITED - RIGHT UPPER QUADRANT

[Series 1: us abdomen limited · 0.26mm/px · 14 of 61 slices shown]
[im 1/61]
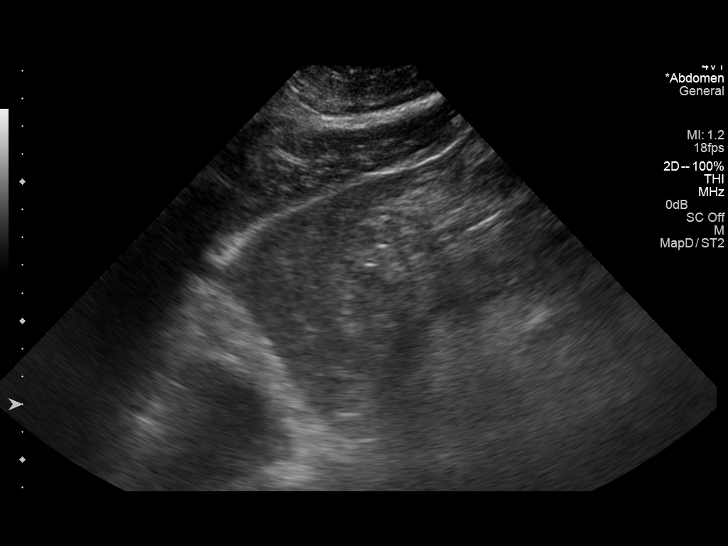
[im 6/61]
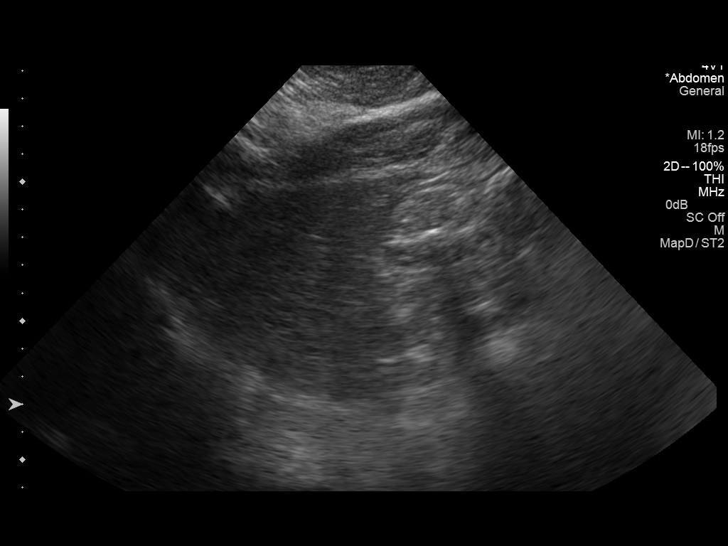
[im 11/61]
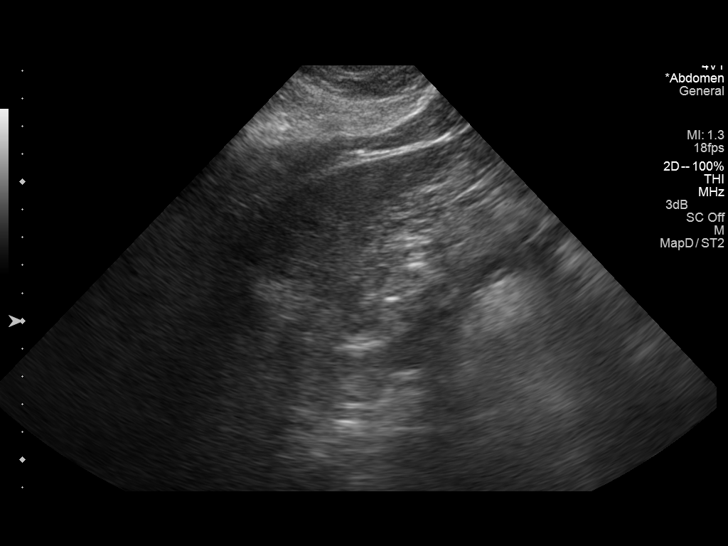
[im 16/61]
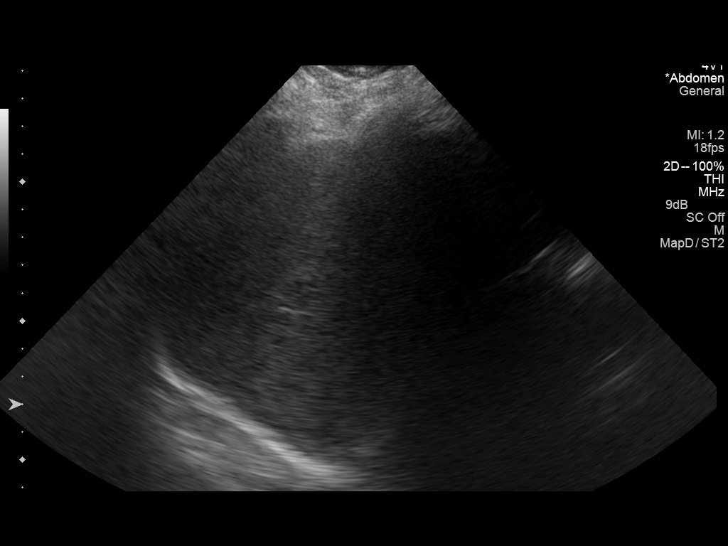
[im 21/61]
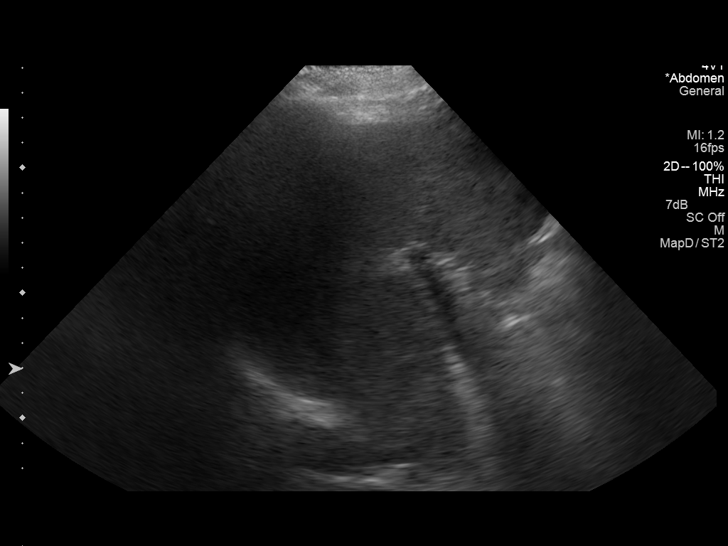
[im 23/61]
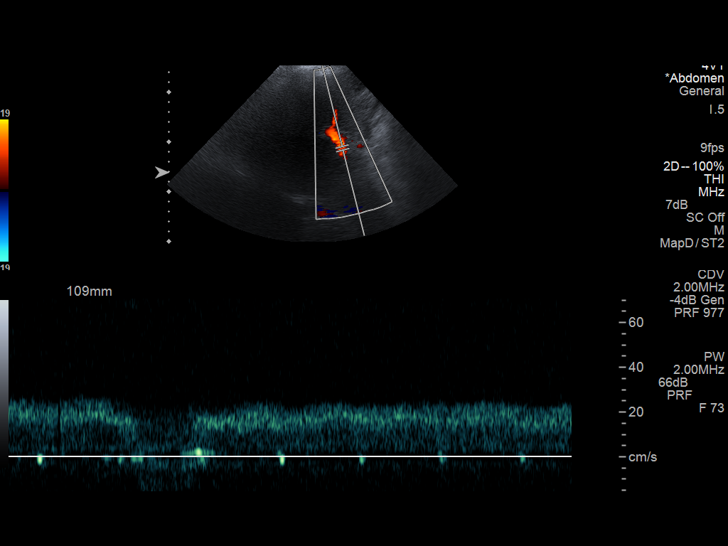
[im 28/61]
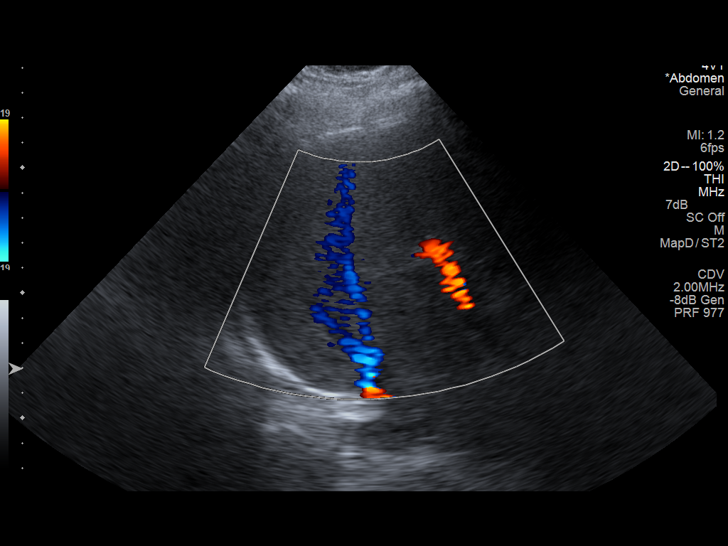
[im 33/61]
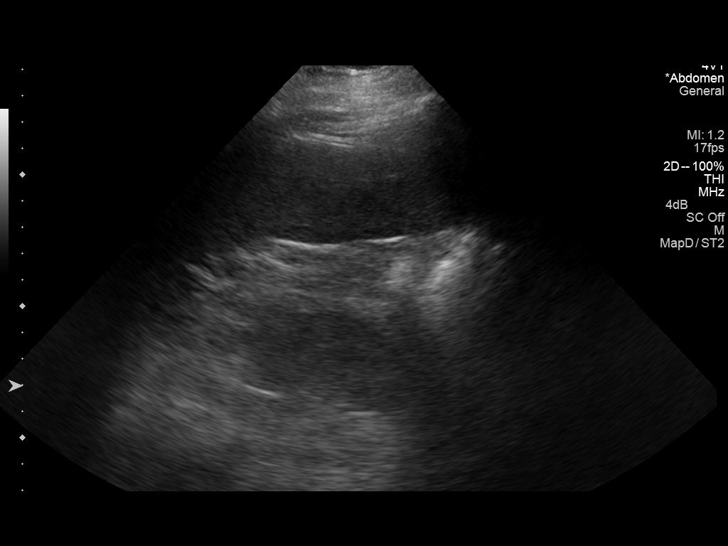
[im 38/61]
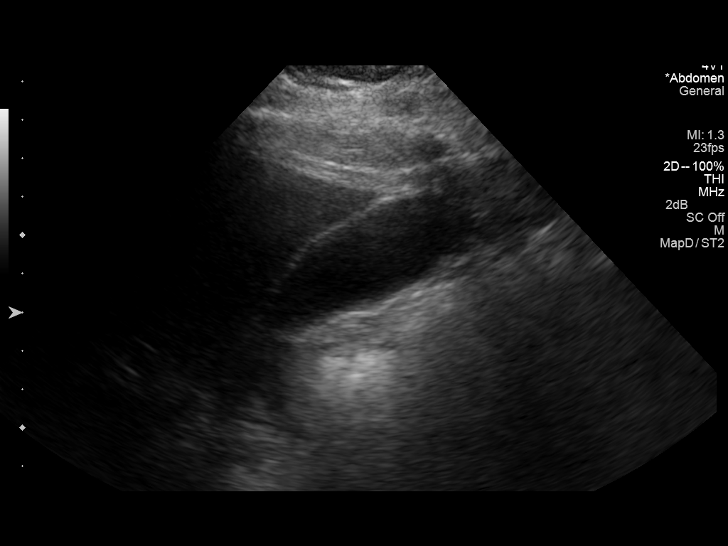
[im 41/61]
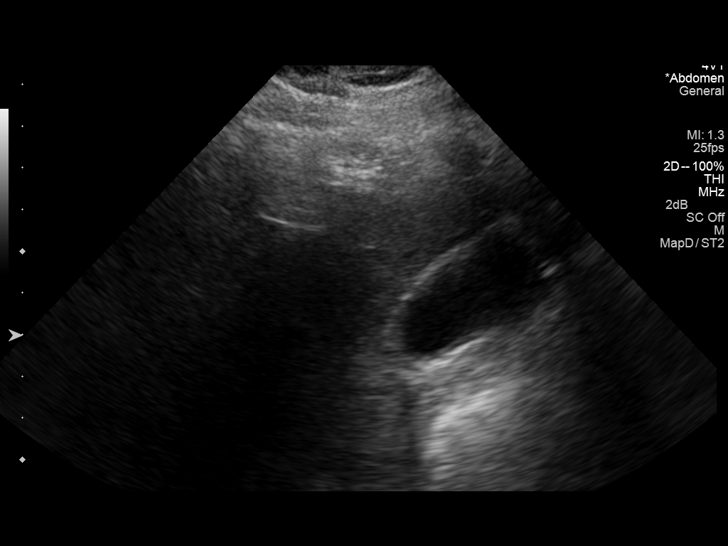
[im 46/61]
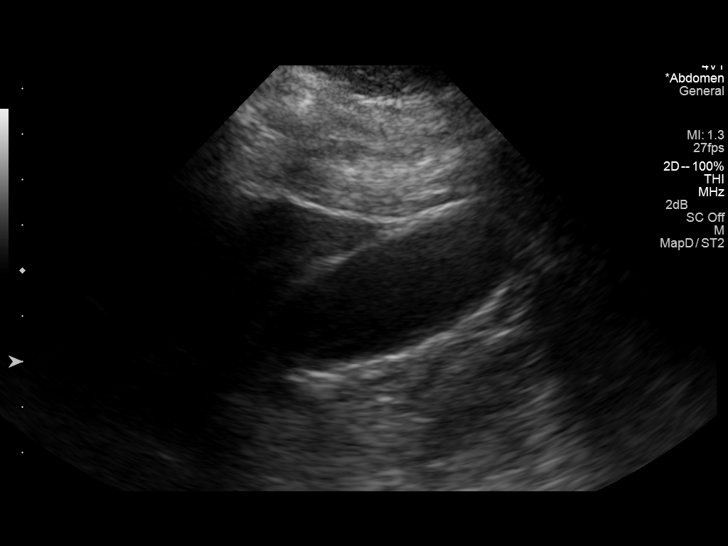
[im 51/61]
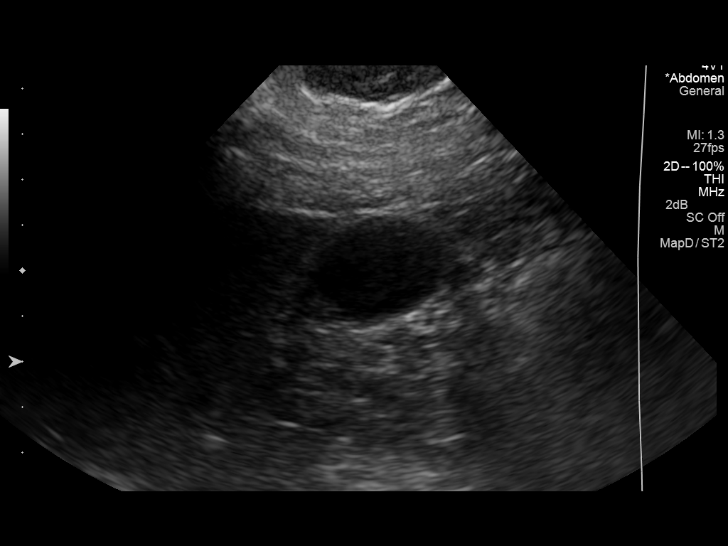
[im 56/61]
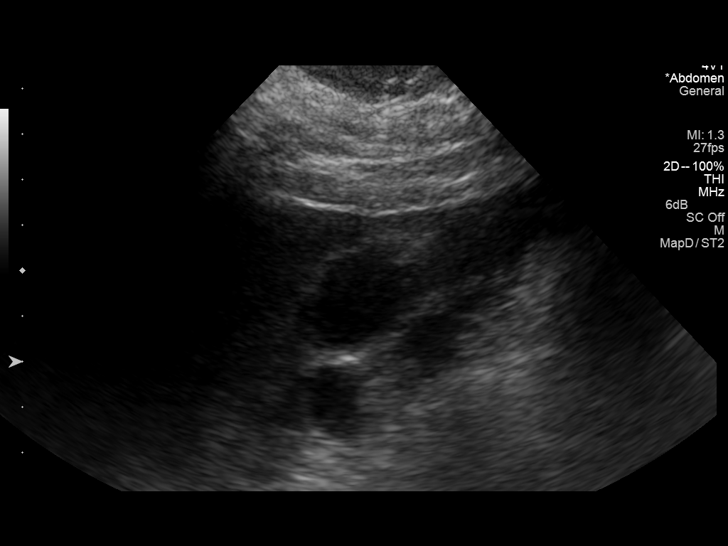
[im 61/61]
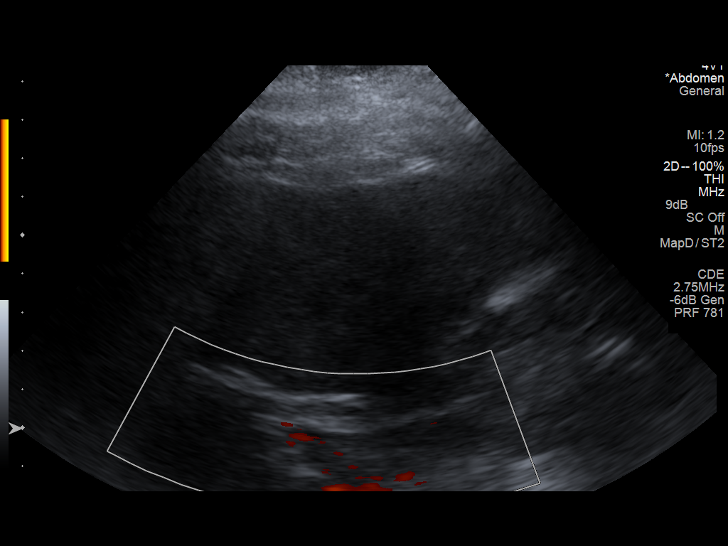

[14 of 25 positions shown; findings below may reference images not displayed]

FINDINGS: Gallbladder:

No gallstones, gallbladder wall thickening, or pericholecystic
fluid. No sonographic Murphy sign elicited.

Common bile duct

Diameter: Normal, 4 mm diameter.

Liver:

Echogenicity within normal limits. No intrahepatic ductal
dilatation. No focal liver lesion identified. No perihepatic free
fluid.

Other findings: Negative visible right kidney (image 33, 36).
IMPRESSION: Negative right upper quadrant ultrasound.

## 2012-11-14 IMAGING — CT CT ABD-PELV W/ CM
2 of 4 series · 16 of 46 positions shown, 18 images · IV contrast (Omnipaque 300)
Comparison: None.

CLINICAL DATA: Abdominal pain, the patient had a stimulator placed
into his back a few weeks ago and has had bilateral flank pain
since

CT ABDOMEN AND PELVIS WITH CONTRAST
TECHNIQUE: Multidetector CT imaging of the abdomen and pelvis was
performed following the standard protocol during bolus
administration of intravenous contrast.
Contrast: 100mL OMNIPAQUE IOHEXOL 300 MG/ML  SOLN

[Series 2: abd_pel_with 5.0 b40f · axial · 0.89mm/px · z∈[-534,-84]mm · 13 of 100 slices shown, 15 images]
[im 5/100  soft-tissue]
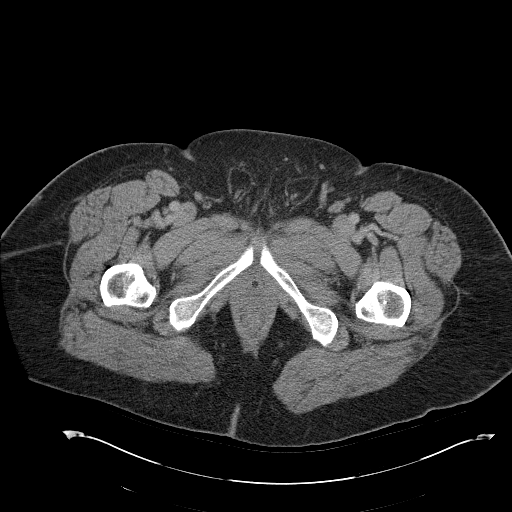
[im 5/100  bone]
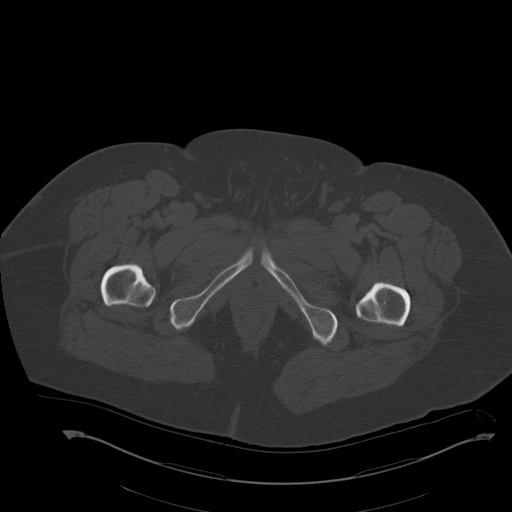
[im 15/100  soft-tissue]
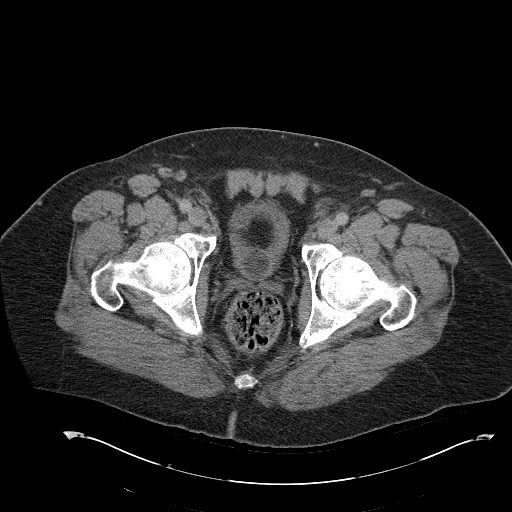
[im 19/100  soft-tissue]
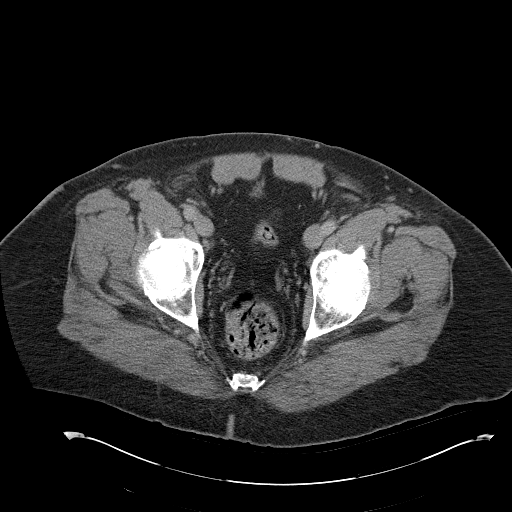
[im 29/100  soft-tissue]
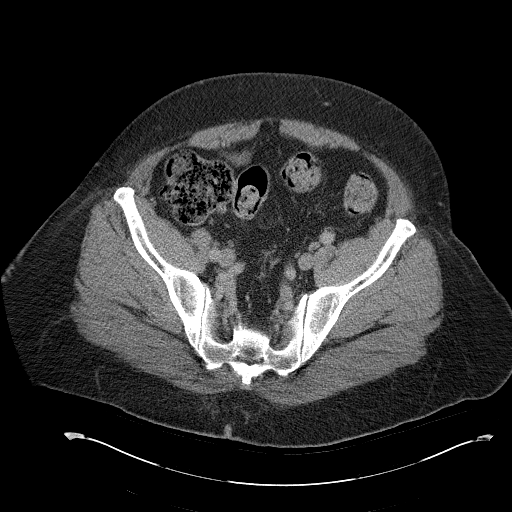
[im 34/100  soft-tissue]
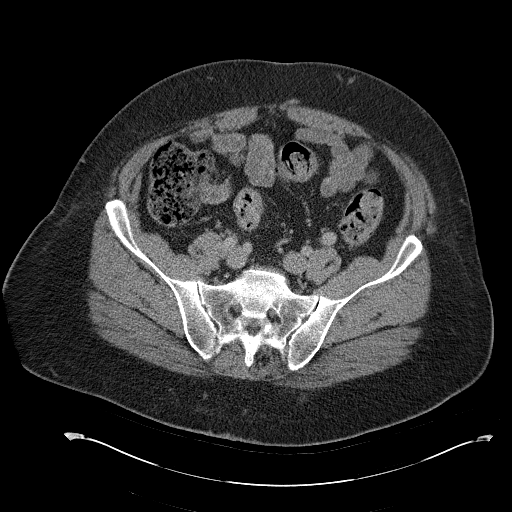
[im 43/100  soft-tissue]
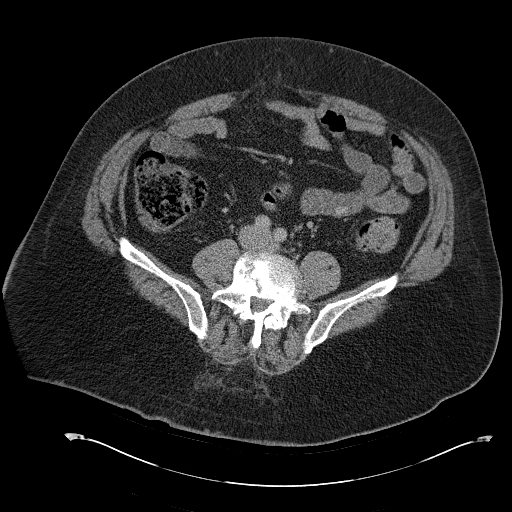
[im 52/100  soft-tissue]
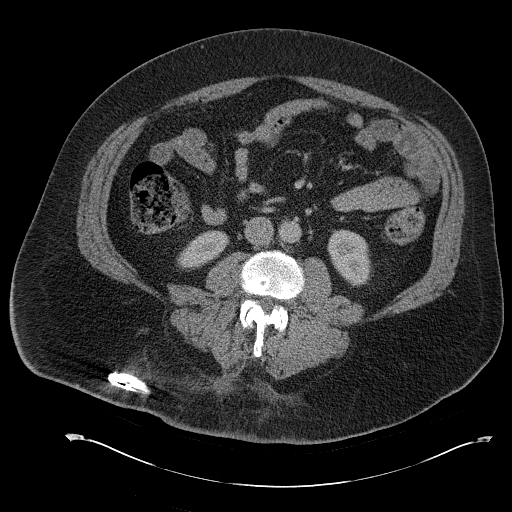
[im 57/100  soft-tissue]
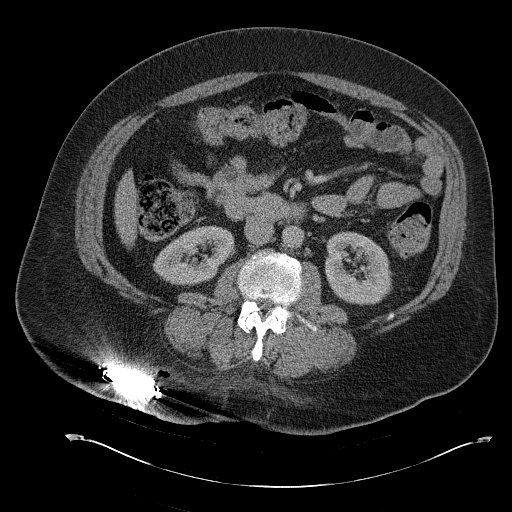
[im 67/100  soft-tissue]
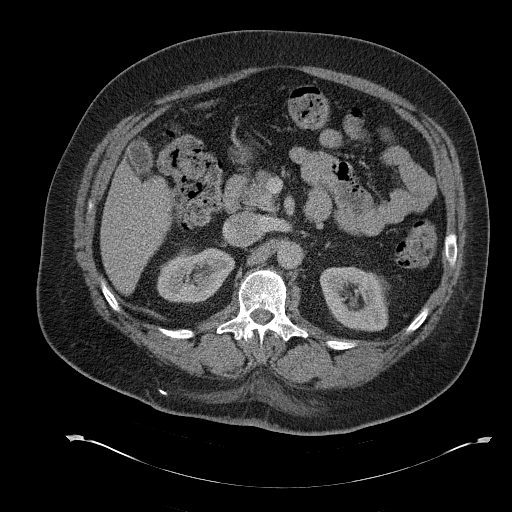
[im 67/100  bone]
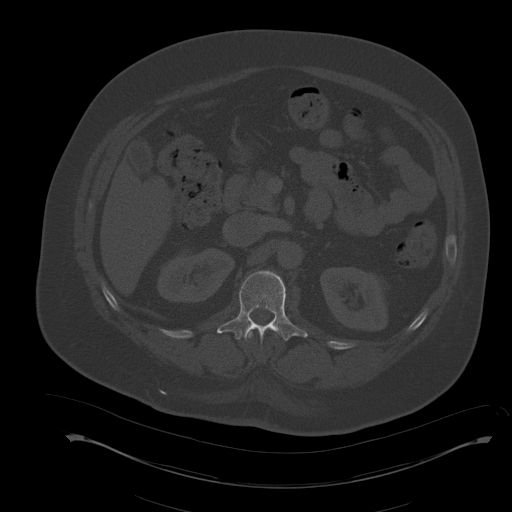
[im 71/100  soft-tissue]
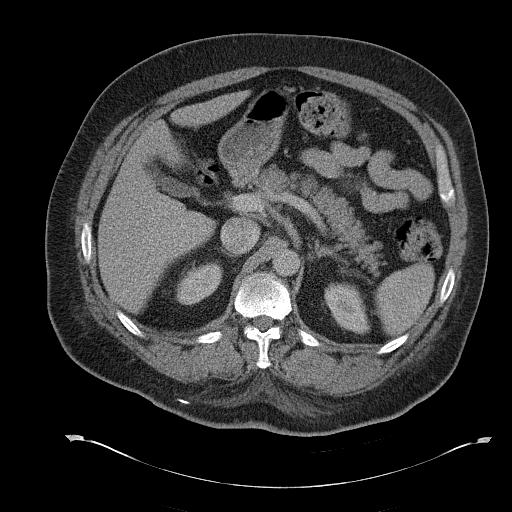
[im 81/100  soft-tissue]
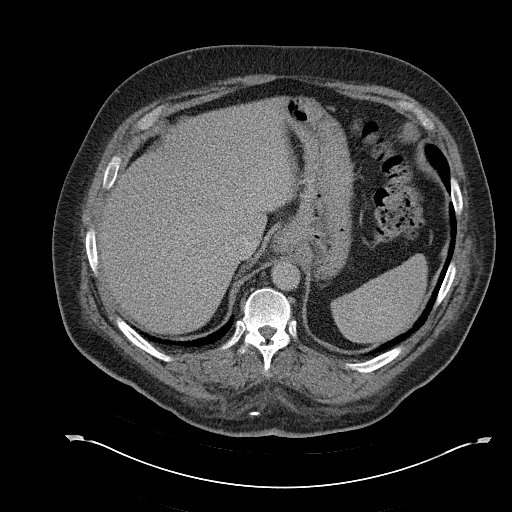
[im 85/100  soft-tissue]
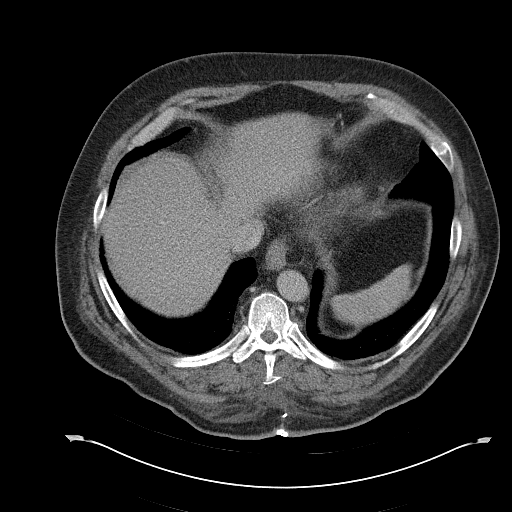
[im 95/100  soft-tissue]
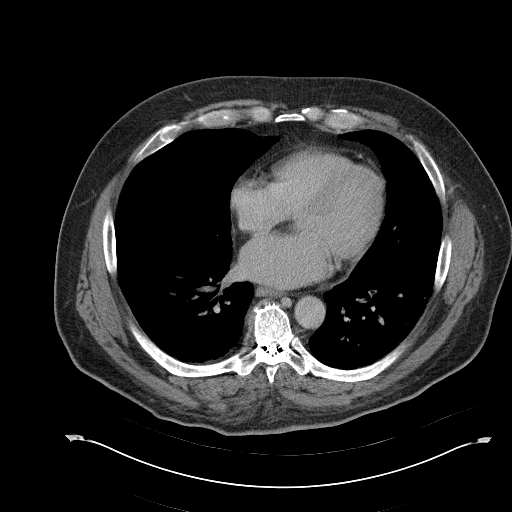

[Series 4: abd_pel_with 3.0 spo · coronal · 0.89mm/px · 3 of 120 slices shown]
[im 40/120  soft-tissue]
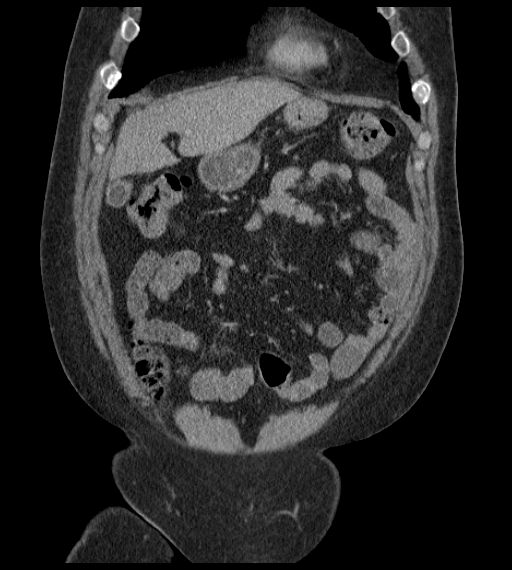
[im 53/120  soft-tissue]
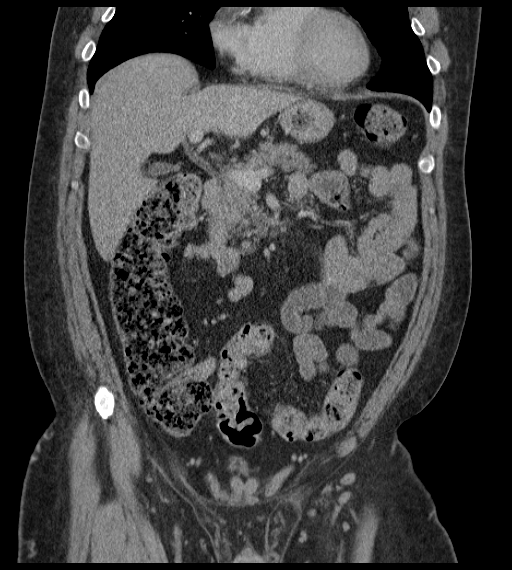
[im 67/120  soft-tissue]
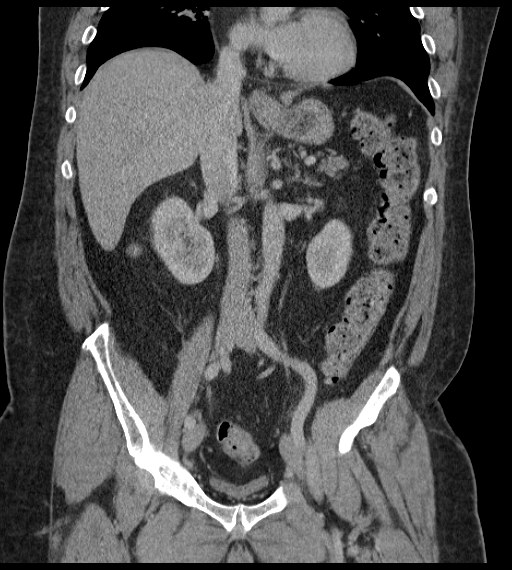

[16 of 46 positions shown; findings below may reference images not displayed]

FINDINGS: Liver, gallbladder, spleen, pancreas, kidneys, adrenal
glands, and abdominal aorta are all normal.  Stool is seen
throughout the entire colon but the bowel is otherwise normal.  No
ascites.  Bladder and reproductive organs are normal - bladder is
decompressed by Foley catheter.

No acute osseous abnormalities.  Stimulator is seen in the
posterior right flank with a lead extending cranially to enter the
spinal canal in the lower thoracic region.  From T8-T10, overlying
the spine in the paraspinous soft tissues, there is focal soft
tissue attenuation replacing normal anatomic architecture.  There
is no peripheral enhancement.  Average attenuation value of this
area is approximately zero.  A few foci of gas are seen within this
area.

There are several pulmonary nodules seen in the right lung base,
the largest measuring 5 mm.
IMPRESSION: 1.  Constipation

2.   Paraspinous soft tissue fluid collection measuring
approximately 5 x 5 x 4 cm near where the stimulator lead enters
the spine.  A few foci of gas are seen within the collection.  This
is a nonspecific finding that could reflect resolving postoperative
change. The presence of a air can be seen with an abscess but there
is no evidence of rim enhancement to further suggest  this.

3.  There are several pulmonary nodules in the right lung base. If
the patient is at high risk for bronchogenic carcinoma, follow-up
chest CT at 6-12 months is recommended.  If the patient is at low
risk for bronchogenic carcinoma, follow-up chest CT at 12 months is
recommended.  This recommendation follows the consensus statement:
Guidelines for Management of Small Pulmonary Nodules Detected on CT
Scans: A Statement from the [HOSPITAL] as published in

## 2012-11-14 MED ORDER — METOCLOPRAMIDE HCL 5 MG/ML IJ SOLN
10.0000 mg | Freq: Once | INTRAMUSCULAR | Status: AC
  Administered 2012-11-14: 10 mg via INTRAVENOUS
  Filled 2012-11-14: qty 2

## 2012-11-14 MED ORDER — IOHEXOL 300 MG/ML  SOLN
100.0000 mL | Freq: Once | INTRAMUSCULAR | Status: AC | PRN
  Administered 2012-11-14: 100 mL via INTRAVENOUS

## 2012-11-14 MED ORDER — POLYETHYLENE GLYCOL 3350 17 GM/SCOOP PO POWD: 17.0000 g | Freq: Two times a day (BID) | ORAL | Status: DC

## 2012-11-14 MED ORDER — SODIUM CHLORIDE 0.9 % IV BOLUS (SEPSIS)
1000.0000 mL | Freq: Once | INTRAVENOUS | Status: AC
  Administered 2012-11-14: 1000 mL via INTRAVENOUS

## 2012-11-14 MED ORDER — HYDROMORPHONE HCL PF 1 MG/ML IJ SOLN
1.0000 mg | Freq: Once | INTRAMUSCULAR | Status: AC
  Administered 2012-11-14: 1 mg via INTRAVENOUS
  Filled 2012-11-14: qty 1

## 2012-11-14 MED ORDER — DOCUSATE SODIUM 100 MG PO CAPS: 100.0000 mg | ORAL_CAPSULE | Freq: Two times a day (BID) | ORAL | Status: DC

## 2012-11-14 NOTE — ED Provider Notes (Signed)
CSN: [id number]     Arrival date & time 11/14/12  0802 History  This chart was scribed for Raeford Razor, MD by Blanchard Kelch, ED Scribe. The patient was seen in room APA09/APA09. Patient's care was started at 8:37 AM.    Chief Complaint  Patient presents with  . Abdominal Pain  . bilateral flank pain     Patient is a 57 y.o. male presenting with abdominal pain. The history is provided by the patient and the spouse. No language interpreter was used.  Abdominal Pain Pain location:  Generalized Pain radiates to:  Back, L flank, R flank and chest Duration:  1 day Timing:  Constant Relieved by: lying on side. Ineffective treatments: muscle relaxant. Associated symptoms: no chills, no fever and no nausea     HPI Comments: Antonio Lucas is a 56 y.o. male who presents to the Emergency Department complaining of constant abdominal, flank and chest wall pain that radiates to his back that began yesterday. The pain is described as similar to "pressing on a bruise." His wife gave him a muscle relaxer last night with no relief. The pain is mildly improved by laying on his side. He had a neuro stimulator placed in his lower back four days ago. He reports the surgery went well but he was unable to urinate so a catheter was placed in the ER yesterday. He denies problems with the catheter and is no blood present in it. He took pain medication for his surgery about two hours ago. He denies any previous surgery on abdomen.  He denies any previous similar episodes of pain. He has a history of kidney stones and reports the pain is "somewhat similar" but was in a different location than where the pain is now. He has a past medical history of GERD but denies similar pain to those episodes. He denies changes in appetite, nausea, fever, or chills.         Past Medical History  Diagnosis Date  . Pneumonia 1995  . GERD (gastroesophageal reflux disease)   . Arthritis   . Hyperlipemia   . Hypertension     Past Surgical History  Procedure Laterality Date  . Back surgery      02.2011- HE HAD SOME NECK PROBLEMS WITH C7 .  . Carpal tunnel release      both arms  . Tonsillectomy    . Esophagogastroduodenoscopy (egd) with esophageal dilation  01/07/2012    Procedure: ESOPHAGOGASTRODUODENOSCOPY (EGD) WITH ESOPHAGEAL DILATION;  Surgeon: Malissa Hippo, MD;  Location: [facility];  Service: Endoscopy;  Laterality: N/A;  145  . Colonoscopy  02/16/2012    Procedure: COLONOSCOPY;  Surgeon: Malissa Hippo, MD;  Location: [facility];  Service: Endoscopy;  Laterality: N/A;  730   No family history on file. History  Substance Use Topics  . Smoking status: Former Smoker -- 2.00 packs/day for 40 years    Types: Cigarettes    Start date: 03/03/1962    Quit date: 03/04/2007  . Smokeless tobacco: Never Used  . Alcohol Use: Yes     Comment: glass of wine daily    Review of Systems  Constitutional: Negative for fever, chills and appetite change.  Gastrointestinal: Positive for abdominal pain. Negative for nausea.  Genitourinary: Positive for flank pain.  Musculoskeletal: Positive for back pain.  All other systems reviewed and are negative.    Allergies  Fentanyl  Home Medications   Current Outpatient Rx  Name  Route  Sig  Dispense  Refill  . ALPRAZolam (XANAX) 1 MG tablet   Oral   Take 1 mg by mouth at bedtime as needed. Sleep/anxiety.         Marland Kitchen amLODipine (NORVASC) 5 MG tablet   Oral   Take 5 mg by mouth daily.         . Armodafinil (NUVIGIL) 150 MG tablet   Oral   Take 150 mg by mouth daily.         Marland Kitchen aspirin EC 81 MG tablet   Oral   Take 81 mg by mouth daily.         Marland Kitchen atorvastatin (LIPITOR) 40 MG tablet      TAKE 1 TABLET BY MOUTH EVERY DAY   30 tablet   1     Patient needs to schedule follow up visit.   Marland Kitchen atorvastatin (LIPITOR) 40 MG tablet      TAKE 1 TABLET BY MOUTH EVERY DAY   30 tablet   1     PATIENT NEEDS TO CALL OUR OFFICE TO SCHEDULE FOLLO  ...   . atorvastatin (LIPITOR) 40 MG tablet      TAKE 1 TABLET BY MOUTH EVERY DAY   30 tablet   6   . cyclobenzaprine (FLEXERIL) 10 MG tablet   Oral   Take 10 mg by mouth 3 (three) times daily as needed. Muscle spasms.         Marland Kitchen dexlansoprazole (DEXILANT) 60 MG capsule   Oral   Take 60 mg by mouth daily.         . Diclofenac-Misoprostol 75-0.2 MG TBEC   Oral   Take 1 tablet by mouth 2 (two) times daily as needed. Pain.         . furosemide (LASIX) 20 MG tablet   Oral   Take 20 mg by mouth daily.          Marland Kitchen HYDROcodone-acetaminophen (NORCO) 10-325 MG per tablet   Oral   Take 1 tablet by mouth every 6 (six) hours as needed. Pain.         Marland Kitchen JALYN 0.5-0.4 MG CAPS   Oral   Take 1 capsule by mouth Daily.         Marland Kitchen lamoTRIgine (LAMICTAL) 25 MG tablet   Oral   Take 100 mg by mouth 2 (two) times daily.          . meloxicam (MOBIC) 15 MG tablet   Oral   Take 0.5 tablets (7.5 mg total) by mouth daily.   30 tablet   5   . metoCLOPramide (REGLAN) 10 MG tablet   Oral   Take 10 mg by mouth 2 (two) times daily.         . nebivolol (BYSTOLIC) 5 MG tablet   Oral   Take 10 mg by mouth daily.          Marland Kitchen oxcarbazepine (TRILEPTAL) 600 MG tablet   Oral   Take 300 mg by mouth daily.          Marland Kitchen oxymorphone (OPANA ER) 15 MG 12 hr tablet   Oral   Take 15 mg by mouth every 12 (twelve) hours.         . tadalafil (CIALIS) 20 MG tablet   Oral   Take 20 mg by mouth daily as needed. ED.         Marland Kitchen testosterone cypionate (DEPOTESTOTERONE CYPIONATE) 200 MG/ML injection   Intramuscular   Inject 0.5 mLs into the muscle  Once every 2 weeks.          . traZODone (DESYREL) 150 MG tablet   Oral   Take 1 tablet by mouth at bedtime.          . Vitamin D, Ergocalciferol, (DRISDOL) 50000 UNITS CAPS   Oral   Take 1 capsule by mouth Once a week.          Triage Vtials: BP 123/92  Pulse 72  Temp(Src) 98.4 F (36.9 C) (Oral)  Resp 25  Ht 5\' 7"  (1.702 m)  Wt  270 lb (122.471 kg)  BMI 42.28 kg/m2  SpO2 92%  Physical Exam  Nursing note and vitals reviewed. Constitutional: He is oriented to person, place, and time. He appears well-developed and well-nourished.  HENT:  Head: Normocephalic and atraumatic.  Neck: Neck supple.  Cardiovascular: Normal rate and regular rhythm.   Pulmonary/Chest: Effort normal and breath sounds normal. No respiratory distress.  Abdominal: There is tenderness (mild). There is no rebound and no guarding.  Musculoskeletal:  Ecchymosis along mid lumbar region and right flank. Surgical incision is clean, dry and intact  Neurological: He is alert and oriented to person, place, and time. No cranial nerve deficit.  Skin: Skin is warm and dry.  Psychiatric: He has a normal mood and affect. His behavior is normal.    ED Course  Procedures (including critical care time)  DIAGNOSTIC STUDIES: Oxygen Saturation is 92% on room air, adequate by my interpretation.    COORDINATION OF CARE:  8:49 AM - Patient verbalizes understanding and agrees with treatment plan.    Labs Review Labs Reviewed  COMPREHENSIVE METABOLIC PANEL - Abnormal; Notable for the following:    Glucose, Bld 128 (*)    Total Protein 5.9 (*)    Albumin 2.9 (*)    GFR calc non Af Amer 89 (*)    All other components within normal limits  CBC WITH DIFFERENTIAL - Abnormal; Notable for the following:    RBC 3.82 (*)    Hemoglobin 11.8 (*)    HCT 35.6 (*)    All other components within normal limits  URINALYSIS, ROUTINE W REFLEX MICROSCOPIC - Abnormal; Notable for the following:    APPearance CLOUDY (*)    Specific Gravity, Urine >1.030 (*)    Hgb urine dipstick LARGE (*)    Ketones, ur TRACE (*)    All other components within normal limits  URINE MICROSCOPIC-ADD ON - Abnormal; Notable for the following:    Bacteria, UA MANY (*)    Crystals CA OXALATE CRYSTALS (*)    All other components within normal limits  URINE CULTURE  LIPASE, BLOOD    Imaging Review Ct Lumbar Spine Wo Contrast  11/13/2012   CLINICAL DATA:  Urinary retention. Back pain.  EXAM: CT LUMBAR SPINE WITHOUT CONTRAST  TECHNIQUE: Multidetector CT imaging of the lumbar spine was performed without intravenous contrast administration. Multiplanar CT image reconstructions were also generated.  COMPARISON:  Lumbar spine MRI 12/22/2011  FINDINGS: Negative for acute fracture or suspected traumatic subluxation. No aggressive appearing osseous erosion to suggest osseous infection. No perivertebral edema.  Severe degenerative disc and facet disease:  L1-2: Severe disc narrowing with trace retrolisthesis, narrowing both foramina.  L2-3: Degenerative disc space narrowing; facet osteoarthritis with sclerosis. Disc narrowing and facet spurs encroach on both foramina.  L3-4: Mild anterolisthesis related to advanced facet osteoarthritis. There is disk narrowing and endplate spurring narrowing the foramen. Canal stenosis present. There has been previous left-sided laminotomy.  L4-5: Advanced  degenerative disc disease with endplate spurs contributing to advanced foraminal stenosis when combined with disc narrowing and facet spurs. There may have been pars defects bilaterally at this level. Probable bilateral laminotomy.  L5-S1: Advanced degenerative disc narrowing with endplate and facet spurs resulting in left more than right foraminal stenosis. Spinal canal stenosis may be present. Status post left-sided laminotomy.  There is a spinal stimulator battery pack in the right gluteal region, with surrounding subcutaneous gas related to recent placement. The wiring is extra-spinal at the imaged levels.  IMPRESSION: 1.  No evidence of acute osseous abnormality.  2. 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   Electronically Signed   By: Tiburcio Pea   On: 11/13/2012 05:37   Ct Abdomen Pelvis W Contrast  11/14/2012    *RADIOLOGY REPORT*  Clinical Data: Abdominal pain, the patient had a stimulator placed into his back a few weeks ago and has had bilateral flank pain since  CT ABDOMEN AND PELVIS WITH CONTRAST  Technique:  Multidetector CT imaging of the abdomen and pelvis was performed following the standard protocol during bolus administration of intravenous contrast.  Contrast: OMNIPAQUE IOHEXOL 300 MG/ML  SOLN  Comparison: None.  Findings: Liver, gallbladder, spleen, pancreas, kidneys, adrenal glands, and abdominal aorta are all normal.  Stool is seen throughout the entire colon but the bowel is otherwise normal.  No ascites.  Bladder and reproductive organs are normal - bladder is decompressed by Foley catheter.  No acute osseous abnormalities.  Stimulator is seen in the posterior right flank with a lead extending cranially to enter the spinal canal in the lower thoracic region.  From T8-T10, overlying the spine in the paraspinous soft tissues, there is focal soft tissue attenuation replacing normal anatomic architecture.  There is no peripheral enhancement.  Average attenuation value of this area is approximately zero.  A few foci of gas are seen within this area.  There are several pulmonary nodules seen in the right lung base, the largest measuring 5 mm.  IMPRESSION: 1.  Constipation  2.   Paraspinous soft tissue fluid collection measuring approximately 5 x 5 x 4 cm near where the stimulator lead enters the spine.  A few foci of gas are seen within the collection.  This is a nonspecific finding that could reflect resolving postoperative change. The presence of a air can be seen with an abscess but there is no evidence of rim enhancement to further suggest  this.  3.  There are several pulmonary nodules in the right lung base. If the patient is at high risk for bronchogenic carcinoma, follow-up chest CT at 6-12 months is recommended.  If the patient is at low risk for bronchogenic carcinoma, follow-up chest CT at 12  months is recommended.  This recommendation follows the consensus statement: Guidelines for Management of Small Pulmonary Nodules Detected on CT Scans: A Statement from the [employer] as published in Radiology 2005; 237:395-400.   Original Report Authenticated By: Esperanza Heir, M.D.   US Abdomen Limited Ruq  11/14/2012   CLINICAL DATA:  57 year old male with right upper quadrant pain.  EXAM: US ABDOMEN LIMITED - RIGHT UPPER QUADRANT  COMPARISON:  Lumbar spine CT 11/13/2012. Thoracic MRI 10/05/2012.  Marland Kitchen  FINDINGS: Gallbladder:  No gallstones, gallbladder wall thickening, or pericholecystic fluid. No sonographic Murphy sign elicited.  Common bile duct  Diameter: Normal, 4 mm diameter.  Liver:  Echogenicity within normal limits. No intrahepatic ductal dilatation.  No focal liver lesion identified. No perihepatic free fluid.  Other findings: Negative visible right kidney (image 33, 36).  IMPRESSION: Negative right upper quadrant ultrasound.   Electronically Signed   By: Augusto Gamble M.D.   On: 11/14/2012 09:45    MDM   1. Abdominal pain    56ym with abdominal pain. Doubt complication of recent procedure. Hd stable. May be constipation/ileus. Bowel regimen. Return precautions discussed.   I personally preformed the services scribed in my presence. The recorded information has been reviewed is accurate. Raeford Razor, MD.    Raeford Razor, MD 11/18/12 [phone]

## 2012-11-14 NOTE — ED Notes (Signed)
Pt had neuro stimulator placed to lower back on Thursday. States he was unable to urinate after the surgery so foley cath was placed. Pt states he feels "full" c/o bilateral flank pain and abdominal pain with palpation.

## 2012-11-15 ENCOUNTER — Encounter (HOSPITAL_COMMUNITY): Payer: Self-pay

## 2012-11-15 ENCOUNTER — Emergency Department (HOSPITAL_COMMUNITY)
Admission: EM | Admit: 2012-11-15 | Discharge: 2012-11-15 | Disposition: A | Discharge status: Home / Self Care | Payer: Federal, State, Local not specified - PPO | Attending: Emergency Medicine | Admitting: Emergency Medicine

## 2012-11-15 DIAGNOSIS — Z79899 Other long term (current) drug therapy: Secondary | ICD-10-CM | POA: Insufficient documentation

## 2012-11-15 DIAGNOSIS — I10 Essential (primary) hypertension: Secondary | ICD-10-CM | POA: Insufficient documentation

## 2012-11-15 DIAGNOSIS — E785 Hyperlipidemia, unspecified: Secondary | ICD-10-CM | POA: Insufficient documentation

## 2012-11-15 DIAGNOSIS — K219 Gastro-esophageal reflux disease without esophagitis: Secondary | ICD-10-CM | POA: Insufficient documentation

## 2012-11-15 DIAGNOSIS — Z87891 Personal history of nicotine dependence: Secondary | ICD-10-CM | POA: Insufficient documentation

## 2012-11-15 DIAGNOSIS — R3989 Other symptoms and signs involving the genitourinary system: Secondary | ICD-10-CM | POA: Insufficient documentation

## 2012-11-15 DIAGNOSIS — Z7982 Long term (current) use of aspirin: Secondary | ICD-10-CM | POA: Insufficient documentation

## 2012-11-15 DIAGNOSIS — Z8701 Personal history of pneumonia (recurrent): Secondary | ICD-10-CM | POA: Insufficient documentation

## 2012-11-15 DIAGNOSIS — Z466 Encounter for fitting and adjustment of urinary device: Secondary | ICD-10-CM | POA: Insufficient documentation

## 2012-11-15 DIAGNOSIS — M129 Arthropathy, unspecified: Secondary | ICD-10-CM | POA: Insufficient documentation

## 2012-11-15 DIAGNOSIS — IMO0002 Reserved for concepts with insufficient information to code with codable children: Secondary | ICD-10-CM | POA: Insufficient documentation

## 2012-11-15 LAB — URINE CULTURE
Colony Count: NO GROWTH
Culture: NO GROWTH

## 2012-11-15 MED ORDER — ALPRAZOLAM 0.5 MG PO TABS
1.0000 mg | ORAL_TABLET | Freq: Once | ORAL | Status: AC
  Administered 2012-11-15: 1 mg via ORAL
  Filled 2012-11-15: qty 2

## 2012-11-15 NOTE — ED Notes (Signed)
Pt was here Saturday for urinary retention post back surgery. Pt here to have foley removed per pt. Denies any complications

## 2012-11-15 NOTE — ED Notes (Signed)
10cc ns removed from balloon in foley. Foley dc'd without difficulty. Pt tolerated well

## 2012-11-16 NOTE — ED Provider Notes (Signed)
CSN: [id number]     Arrival date & time 11/15/12  1033 History   First MD Initiated Contact with Patient 11/15/12 1142     Chief Complaint  Patient presents with  . remove foley    (Consider location/radiation/quality/duration/timing/severity/associated sxs/prior Treatment) HPI Comments: Antonio Lucas is a 57 y.o. male who presents to the Emergency Department requesting removal of a foley catheter.  He states he had surgery for placement of a neurostimulator on 11/11/12 and developed difficulty urinating since discharge from the hospital.  He was seen here on 9/13 and a foley cath was placed.  He later developed abdominal pain  The following day and seen here again given prescriptions for Colace and Miralax.  He denies any abdominal pain, fever, chills, chest pain, shortness of breath or vomiting.  Patient also states the surgical site to his right mid back is also improving. States that he contacted his PMD to schedule foley removal, but was told to come back to the ED.    The history is provided by the patient and the spouse.    Past Medical History  Diagnosis Date  . Pneumonia 1995  . GERD (gastroesophageal reflux disease)   . Arthritis   . Hyperlipemia   . Hypertension    Past Surgical History  Procedure Laterality Date  . Back surgery      02.2011- HE HAD SOME NECK PROBLEMS WITH C7 .  . Carpal tunnel release      both arms  . Tonsillectomy    . Esophagogastroduodenoscopy (egd) with esophageal dilation  01/07/2012    Procedure: ESOPHAGOGASTRODUODENOSCOPY (EGD) WITH ESOPHAGEAL DILATION;  Surgeon: Malissa Hippo, MD;  Location: [facility];  Service: Endoscopy;  Laterality: N/A;  145  . Colonoscopy  02/16/2012    Procedure: COLONOSCOPY;  Surgeon: Malissa Hippo, MD;  Location: [facility];  Service: Endoscopy;  Laterality: N/A;  730   No family history on file. History  Substance Use Topics  . Smoking status: Former Smoker -- 2.00 packs/day for 40 years    Types:  Cigarettes    Start date: 03/03/1962    Quit date: 03/04/2007  . Smokeless tobacco: Never Used  . Alcohol Use: Yes     Comment: glass of wine daily    Review of Systems  Constitutional: Negative for fever, chills and fatigue.  Respiratory: Negative for cough and shortness of breath.   Cardiovascular: Negative for chest pain.  Gastrointestinal: Negative for nausea, vomiting, abdominal pain, blood in stool and abdominal distention.  Genitourinary: Positive for difficulty urinating. Negative for dysuria, hematuria, flank pain, decreased urine volume, penile swelling, scrotal swelling, penile pain and testicular pain.  Musculoskeletal: Negative for myalgias, back pain and arthralgias.  Skin: Negative for rash.  Neurological: Negative for dizziness, weakness, numbness and headaches.  Hematological: Does not bruise/bleed easily.  All other systems reviewed and are negative.    Allergies  Fentanyl  Home Medications   Current Outpatient Rx  Name  Route  Sig  Dispense  Refill  . ALPRAZolam (XANAX) 1 MG tablet   Oral   Take 1 mg by mouth at bedtime as needed. Sleep/anxiety.         Marland Kitchen amLODipine (NORVASC) 10 MG tablet   Oral   Take 10 mg by mouth daily.         Marland Kitchen aspirin EC 81 MG tablet   Oral   Take 81 mg by mouth daily.         Marland Kitchen atorvastatin (LIPITOR)  40 MG tablet   Oral   Take 40 mg by mouth daily.         Marland Kitchen buPROPion (WELLBUTRIN XL) 300 MG 24 hr tablet   Oral   Take 300 mg by mouth daily.         Marland Kitchen dexlansoprazole (DEXILANT) 60 MG capsule   Oral   Take 60 mg by mouth daily.         . finasteride (PROSCAR) 5 MG tablet   Oral   Take 1 tablet by mouth daily.         . fluticasone (FLONASE) 50 MCG/ACT nasal spray   Nasal   Place 2 sprays into the nose daily.         . furosemide (LASIX) 20 MG tablet   Oral   Take 20 mg by mouth daily.          Marland Kitchen HYDROcodone-acetaminophen (NORCO) 10-325 MG per tablet   Oral   Take 1 tablet by mouth every 6  (six) hours as needed. Pain.         . lamoTRIgine (LAMICTAL) 100 MG tablet   Oral   Take 100 mg by mouth 2 (two) times daily.         . nebivolol (BYSTOLIC) 10 MG tablet   Oral   Take 10 mg by mouth daily.         Marland Kitchen oxymorphone (OPANA ER) 15 MG 12 hr tablet   Oral   Take 15 mg by mouth every 12 (twelve) hours.         . tadalafil (CIALIS) 5 MG tablet   Oral   Take 5 mg by mouth daily.         Marland Kitchen testosterone cypionate (DEPOTESTOTERONE CYPIONATE) 200 MG/ML injection   Intramuscular   Inject 0.5 mLs into the muscle every 21 ( twenty-one) days.          . traZODone (DESYREL) 150 MG tablet   Oral   Take 1 tablet by mouth at bedtime as needed.          . Vitamin D, Ergocalciferol, (DRISDOL) 50000 UNITS CAPS   Oral   Take 1 capsule by mouth Once a week. sunday         . zolmitriptan (ZOMIG) 5 MG tablet   Oral   Take 1 tablet by mouth every 2 (two) hours as needed for migraine.           BP 130/69  Pulse 60  Temp(Src) 98.9 F (37.2 C) (Oral)  Resp 18  SpO2 94% Physical Exam  Nursing note and vitals reviewed. Constitutional: He is oriented to person, place, and time. He appears well-developed and well-nourished. No distress.  HENT:  Head: Normocephalic and atraumatic.  Neck: Normal range of motion. Neck supple.  Cardiovascular: Normal rate, regular rhythm, normal heart sounds and intact distal pulses.   No murmur heard. Pulmonary/Chest: Effort normal and breath sounds normal. No respiratory distress.  Abdominal: Soft. He exhibits no distension and no mass. There is no tenderness. There is no rebound.  Musculoskeletal: Normal range of motion. He exhibits no edema.  Surgical incision to the mid back and one to the right lower back , both appear to be healing well.  No edema, surrounding erythema  or drainage.    Lymphadenopathy:    He has no cervical adenopathy.  Neurological: He is alert and oriented to person, place, and time. He exhibits normal muscle  tone. Coordination normal.  Skin: Skin is warm  and dry.    ED Course  Procedures (including critical care time) Labs Review Labs Reviewed - No data to display Imaging Review No results found.  MDM   1. Encounter for Foley catheter removal    Foley catheter removed by nursing staff.  Patient tolerated well.  Denies pain or problems at this time.    Ambulates in the dept with steady gait.  Patient advised of risk of possible recurrent urinary retention and agrees to return here if the urinary retention returns.    Patient is feeling better and requesting discharge.  He agrees to f/u  if needed    Paiden Cavell L. Trisha Mangle, PA-C 11/16/12 2024

## 2012-11-17 NOTE — ED Provider Notes (Signed)
Medical screening examination/treatment/procedure(s) were performed by non-physician practitioner and as supervising physician I was immediately available for consultation/collaboration.    Illeana Edick D Jariya Reichow, MD 11/17/12 0729 

## 2012-12-09 ENCOUNTER — Other Ambulatory Visit (HOSPITAL_COMMUNITY): Payer: Self-pay | Admitting: Neurosurgery

## 2012-12-09 ENCOUNTER — Ambulatory Visit (HOSPITAL_COMMUNITY)
Admission: RE | Admit: 2012-12-09 | Discharge: 2012-12-09 | Disposition: A | Discharge status: Home / Self Care | Payer: Federal, State, Local not specified - PPO | Source: Ambulatory Visit | Attending: Neurosurgery | Admitting: Neurosurgery

## 2012-12-09 DIAGNOSIS — R109 Unspecified abdominal pain: Secondary | ICD-10-CM

## 2012-12-09 DIAGNOSIS — I2510 Atherosclerotic heart disease of native coronary artery without angina pectoris: Secondary | ICD-10-CM | POA: Insufficient documentation

## 2012-12-09 DIAGNOSIS — R918 Other nonspecific abnormal finding of lung field: Secondary | ICD-10-CM | POA: Insufficient documentation

## 2012-12-09 IMAGING — CT CT CHEST W/O CM
2 of 3 series · 15 of 36 positions shown, 18 images · non-contrast
Comparison: CT of the abdomen and pelvis [DATE].

CLINICAL DATA: Followup evaluation for pulmonary nodule noted on
prior CT examination.

EXAM:
CT CHEST WITHOUT CONTRAST
TECHNIQUE: Multidetector CT imaging of the chest was performed following the
standard protocol without IV contrast.

[Series 2: chestroutine 5.0 b40f · axial · 0.77mm/px · z∈[-291,-6]mm · 12 of 67 slices shown, 15 images]
[im 5/67  mediastinal]
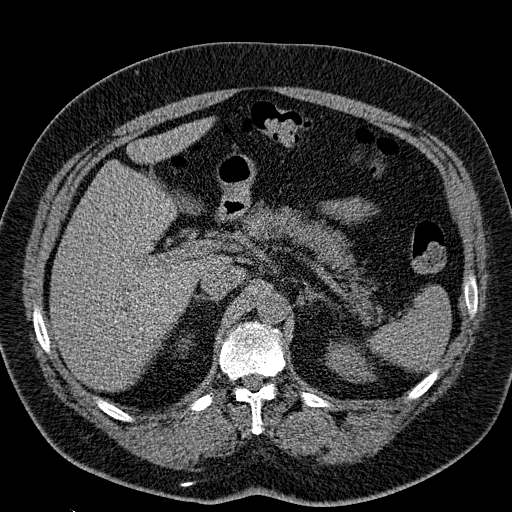
[im 5/67  lung]
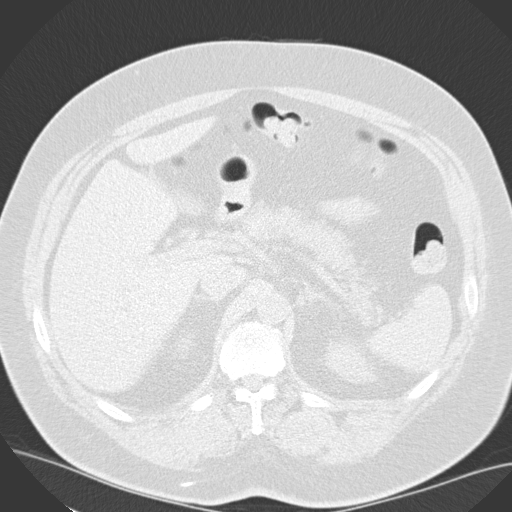
[im 10/67  lung]
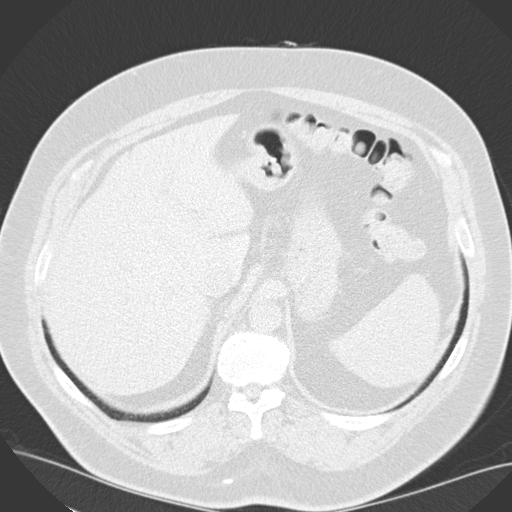
[im 15/67  lung]
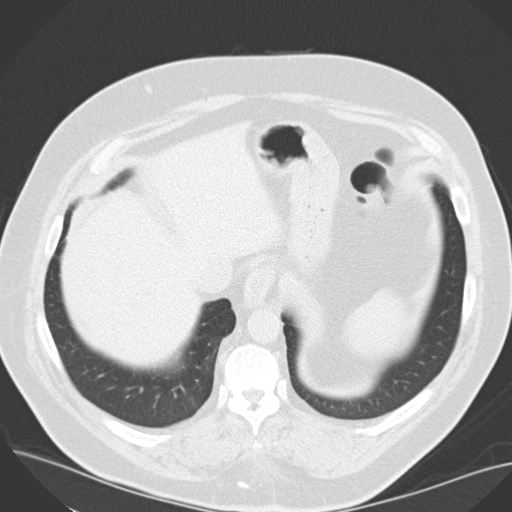
[im 20/67  lung]
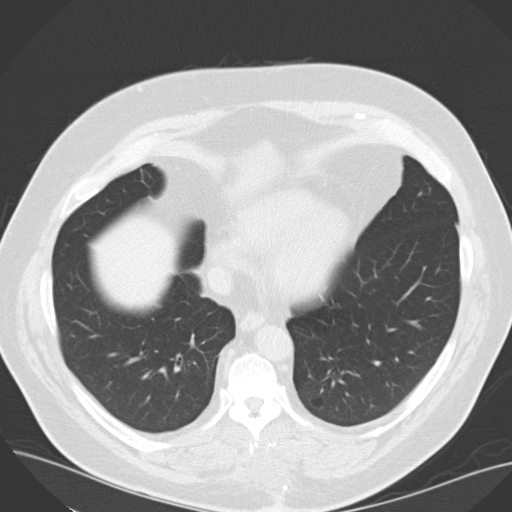
[im 25/67  mediastinal]
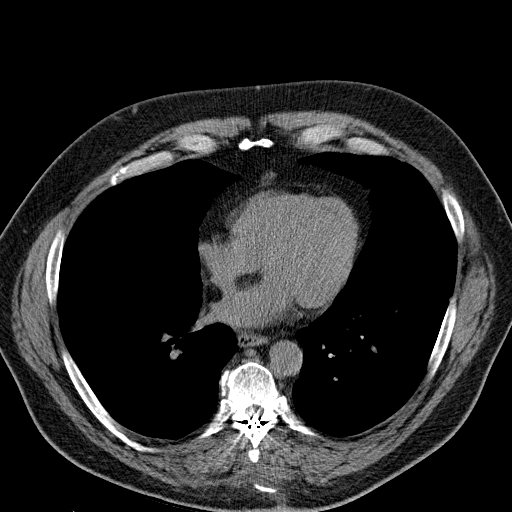
[im 25/67  lung]
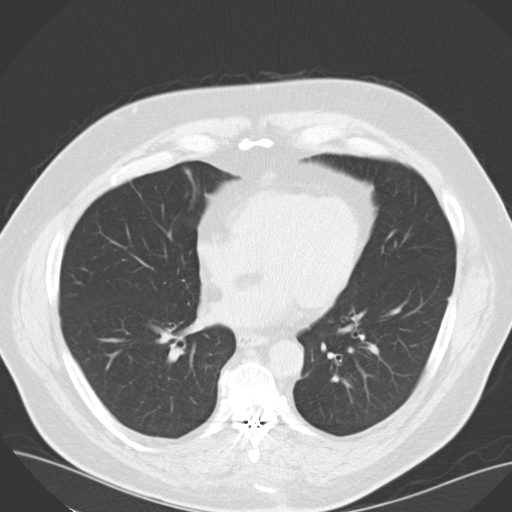
[im 30/67  lung]
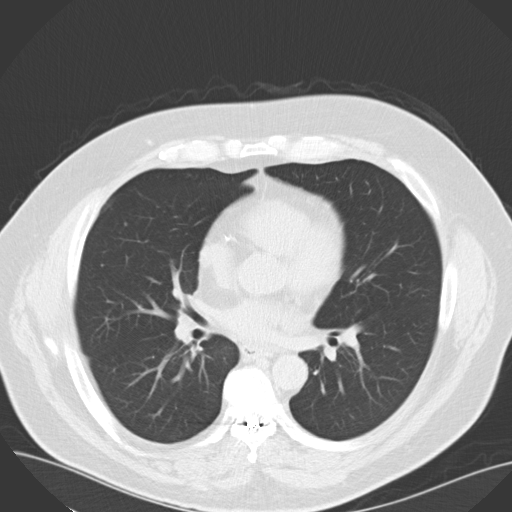
[im 37/67  lung]
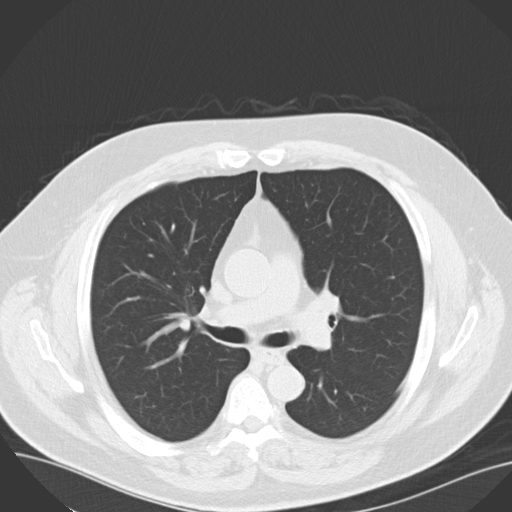
[im 42/67  lung]
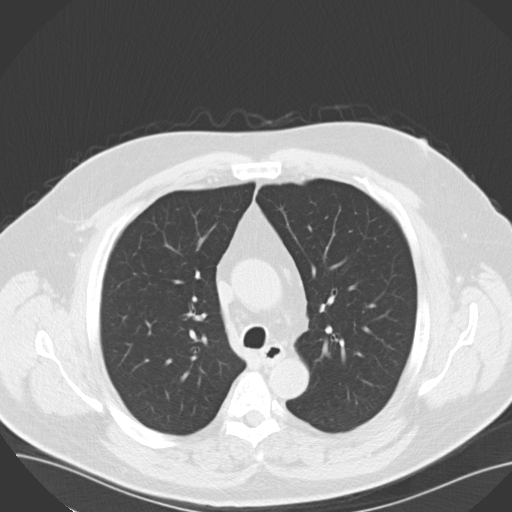
[im 47/67  mediastinal]
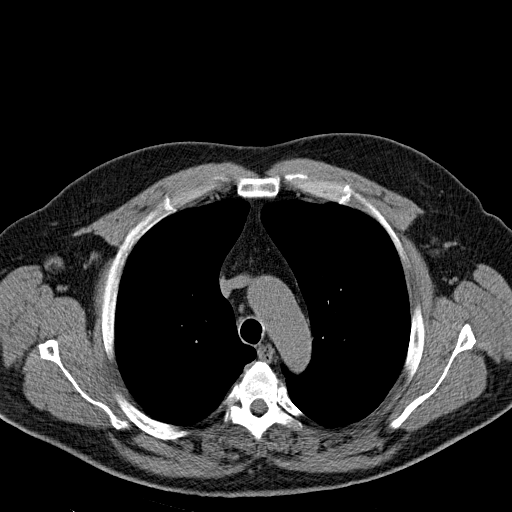
[im 47/67  lung]
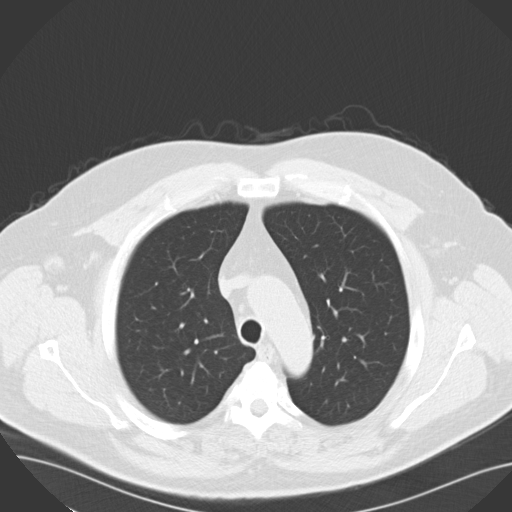
[im 52/67  lung]
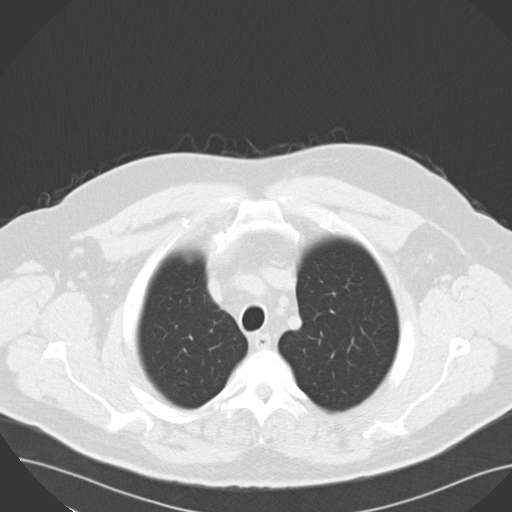
[im 57/67  lung]
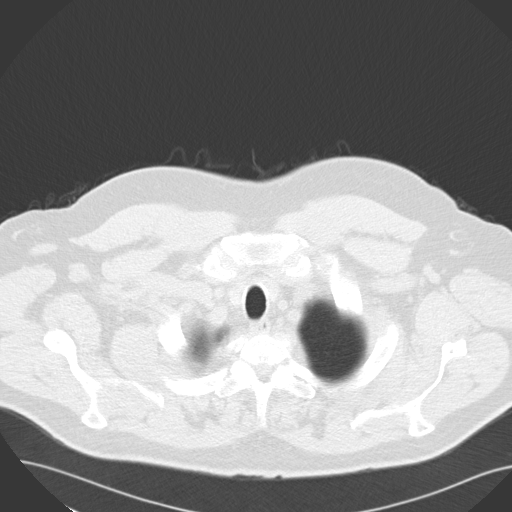
[im 62/67  lung]
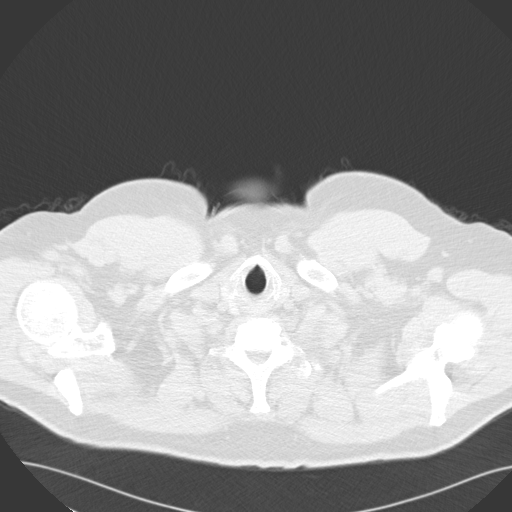

[Series 4: mpr coro 3mm · coronal · 0.65mm/px · 3 of 107 slices shown]
[im 22/107  lung]
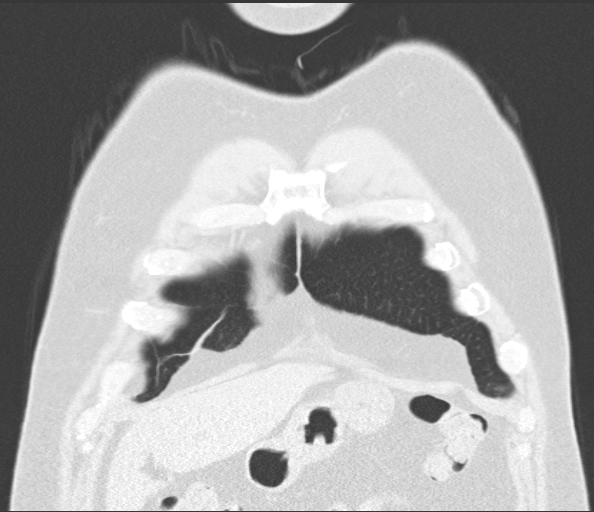
[im 43/107  lung]
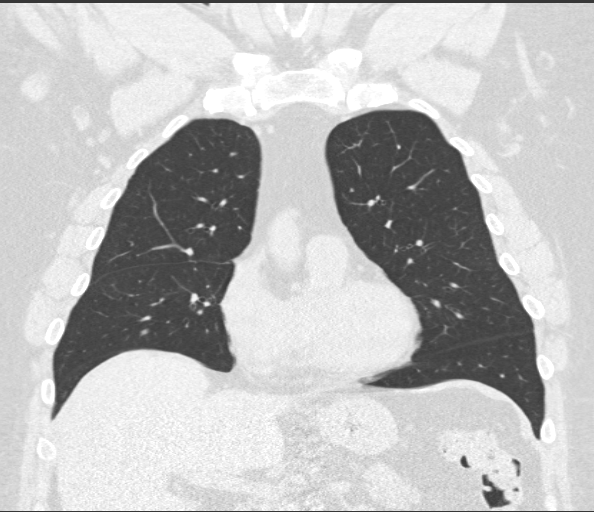
[im 64/107  lung]
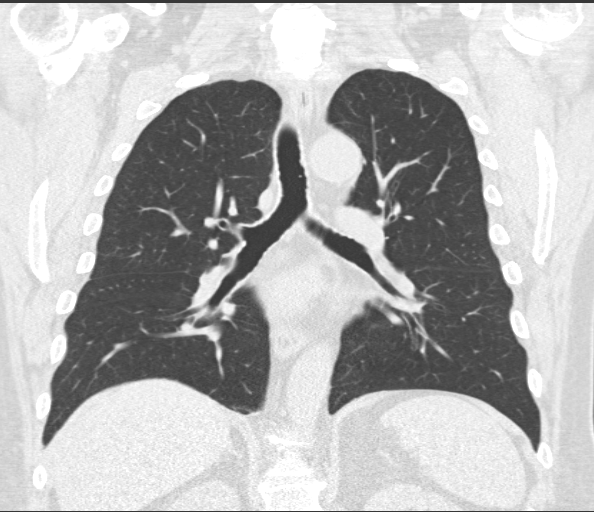

[15 of 36 positions shown; findings below may reference images not displayed]

FINDINGS: Mediastinum: Heart size is normal. There is no significant
pericardial fluid, thickening or pericardial calcification. There is
atherosclerosis of the thoracic aorta, the great vessels of the
mediastinum and the coronary arteries, including calcified
atherosclerotic plaque in the left anterior descending and right
coronary arteries. No pathologically enlarged mediastinal or hilar
lymph nodes. Please note that accurate exclusion of hilar adenopathy
is limited on noncontrast CT scans. Esophagus is unremarkable in
appearance.

Lungs/Pleura: There are numerous tiny subcentimeter pulmonary
nodules scattered throughout the lungs bilaterally. The previously
noted lesions in the right lower lung are all unchanged compared to
the prior examination from [DATE]. The largest nodules include a
4 mm nodule in the right lower lobe on image 41 of series 3 and a 4
mm nodule in the right middle lobe on the same image, as well as a 4
mm nodule in the lingula on image 37 of there series 3. No other
larger more suspicious appearing pulmonary nodules or masses are
otherwise identified. No acute consolidative airspace disease. No
pleural effusions.

Upper Abdomen: Unremarkable.

Musculoskeletal: Spinal cord stimulator in position and mid thoracic
spine. There are no aggressive appearing lytic or blastic lesions
noted in the visualized portions of the skeleton.
IMPRESSION: 1. Multiple nonspecific pulmonary nodules scattered throughout the
lungs bilaterally, measuring up to only 4 mm. If the patient is at
high risk for bronchogenic carcinoma, follow-up chest CT at [9M] is
recommended. If the patient is at low risk, no follow-up is needed.
This recommendation follows the consensus statement: Guidelines for
Management of Small Pulmonary Nodules Detected on CT Scans: A
Statement from the [HOSPITAL] as published in Radiology
[9M]; [DATE].
2. Atherosclerosis, including two vessel coronary artery disease.
Please note that although the presence of coronary artery calcium
documents the presence of coronary artery disease, the severity of
this disease and any potential stenosis cannot be assessed on this
non-gated CT examination. Assessment for potential risk factor
modification, dietary therapy or pharmacologic therapy may be
warranted, if clinically indicated.
3. Additional incidental findings, as above.

## 2013-01-06 ENCOUNTER — Other Ambulatory Visit: Payer: Self-pay

## 2013-01-13 ENCOUNTER — Ambulatory Visit (HOSPITAL_COMMUNITY)
Admission: RE | Admit: 2013-01-13 | Discharge: 2013-01-13 | Disposition: A | Discharge status: Home / Self Care | Payer: Federal, State, Local not specified - PPO | Source: Ambulatory Visit | Attending: Family Medicine | Admitting: Family Medicine

## 2013-01-13 ENCOUNTER — Other Ambulatory Visit (HOSPITAL_COMMUNITY): Payer: Self-pay | Admitting: Family Medicine

## 2013-01-13 DIAGNOSIS — M629 Disorder of muscle, unspecified: Secondary | ICD-10-CM | POA: Insufficient documentation

## 2013-01-13 DIAGNOSIS — M79609 Pain in unspecified limb: Secondary | ICD-10-CM | POA: Insufficient documentation

## 2013-01-13 DIAGNOSIS — M7730 Calcaneal spur, unspecified foot: Secondary | ICD-10-CM | POA: Insufficient documentation

## 2013-01-13 DIAGNOSIS — M25571 Pain in right ankle and joints of right foot: Secondary | ICD-10-CM

## 2013-01-13 DIAGNOSIS — M2420 Disorder of ligament, unspecified site: Secondary | ICD-10-CM | POA: Insufficient documentation

## 2013-01-13 IMAGING — CR DG FOOT COMPLETE 3+V*R*
3 series · 3 of 3 positions shown · non-contrast
Comparison: None.

CLINICAL DATA: Right heel pain.

EXAM:
RIGHT FOOT COMPLETE - 3+ VIEW

[view not recorded (1 of 3)]
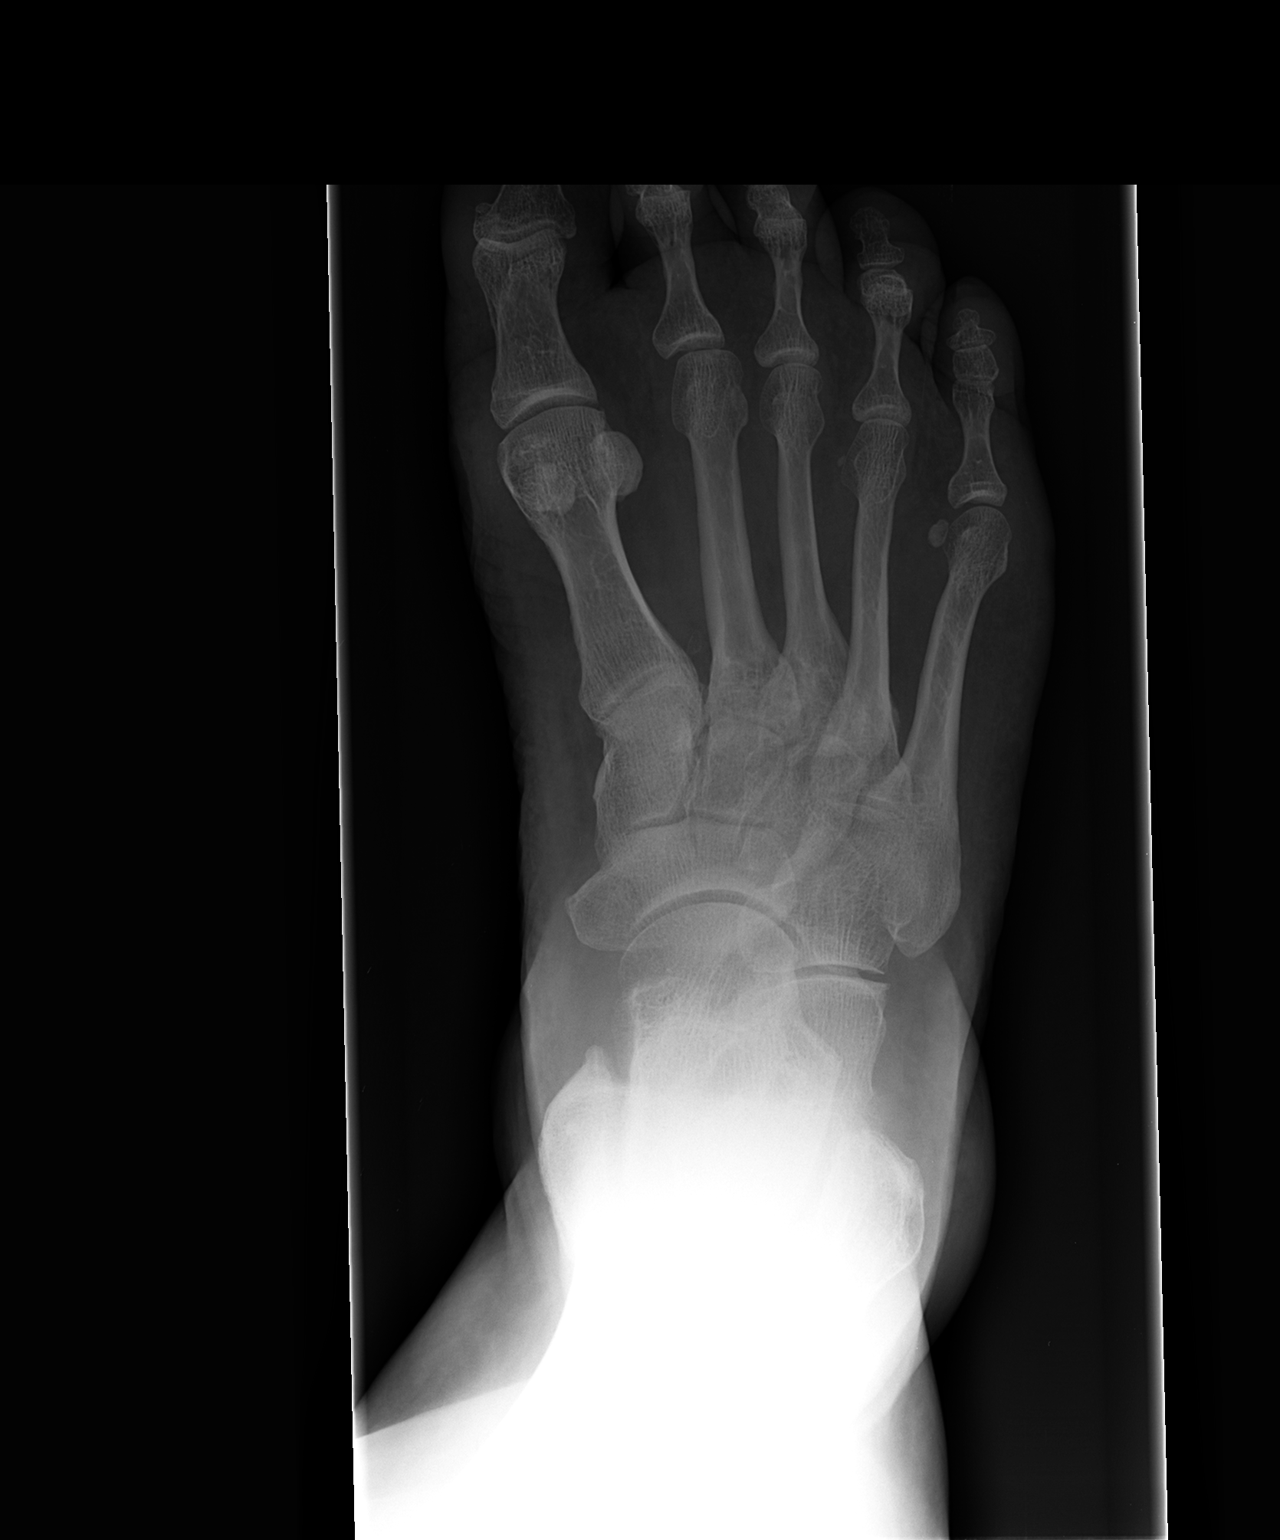

[view not recorded (2 of 3)]
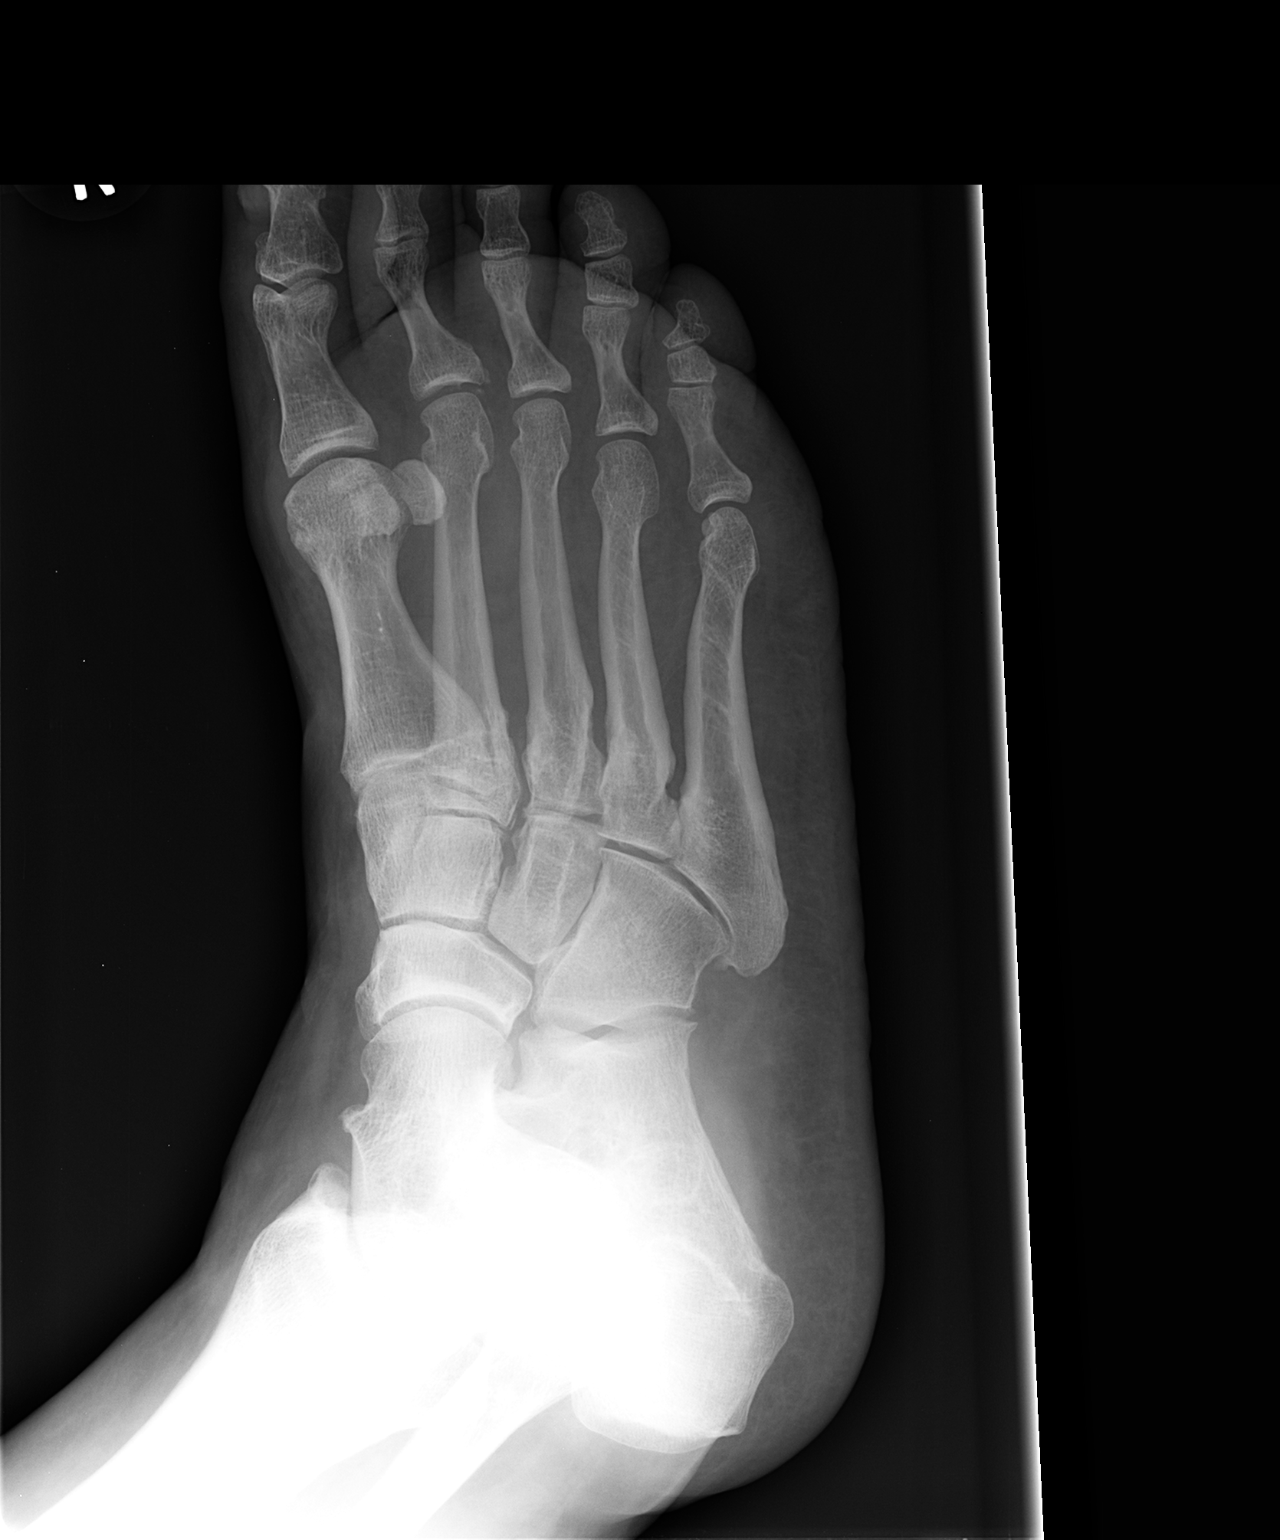

[view not recorded (3 of 3)]
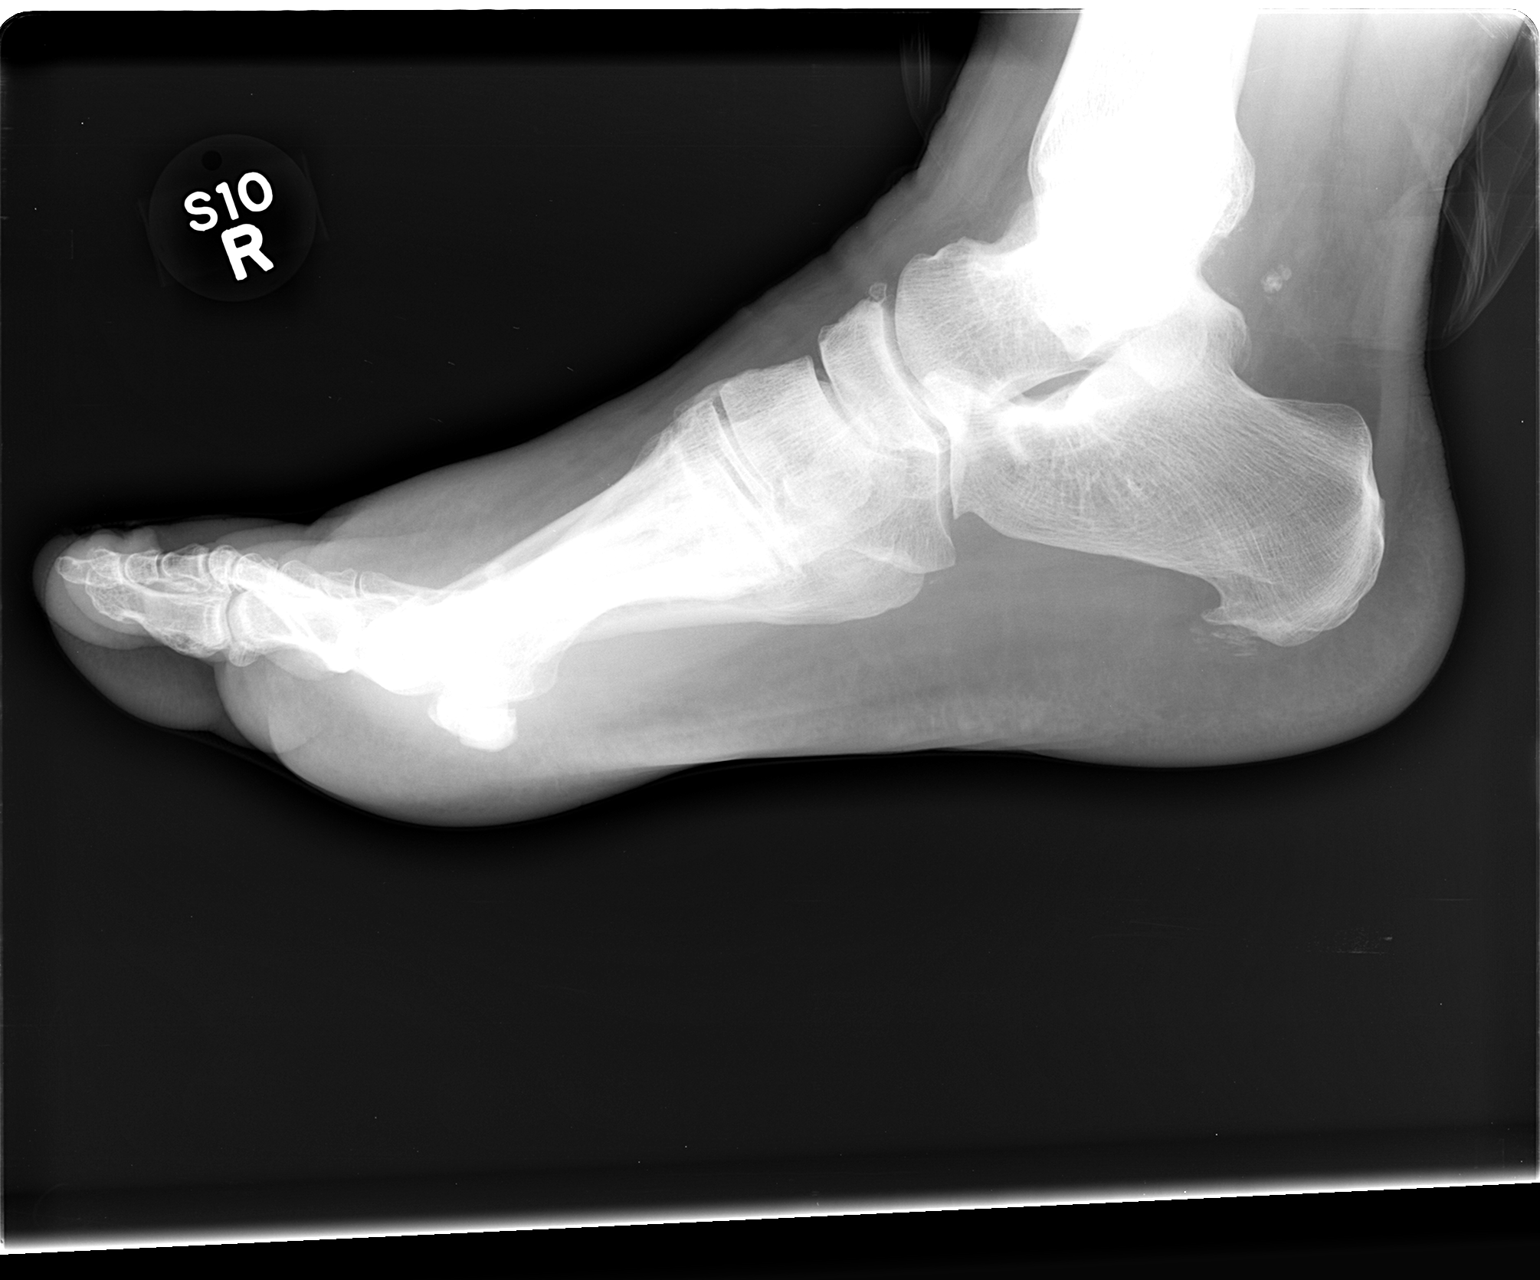

[3 of 3 positions shown; findings below may reference images not displayed]

FINDINGS: Moderate-sized inferior calcaneal spur and adjacent proximal plantar
fascia calcification. Accessory ossicles posterior to the talotibial
joint.
IMPRESSION: Moderate-sized inferior calcaneal spur and proximal plantar fascia
calcification. The plantar fascia calcification is compatible with
chronic plantar fascitis.

## 2013-02-09 ENCOUNTER — Encounter: Payer: Self-pay | Admitting: Cardiology

## 2013-08-20 ENCOUNTER — Other Ambulatory Visit: Payer: Self-pay | Admitting: Cardiology

## 2013-10-17 ENCOUNTER — Other Ambulatory Visit: Payer: Self-pay | Admitting: Cardiology

## 2013-11-29 ENCOUNTER — Encounter (INDEPENDENT_AMBULATORY_CARE_PROVIDER_SITE_OTHER): Payer: Self-pay | Admitting: Internal Medicine

## 2013-11-29 ENCOUNTER — Encounter (HOSPITAL_COMMUNITY): Payer: Self-pay | Admitting: Pharmacy Technician

## 2013-11-29 ENCOUNTER — Encounter (INDEPENDENT_AMBULATORY_CARE_PROVIDER_SITE_OTHER): Payer: Self-pay | Admitting: *Deleted

## 2013-11-29 ENCOUNTER — Other Ambulatory Visit (INDEPENDENT_AMBULATORY_CARE_PROVIDER_SITE_OTHER): Payer: Self-pay | Admitting: *Deleted

## 2013-11-29 ENCOUNTER — Ambulatory Visit (INDEPENDENT_AMBULATORY_CARE_PROVIDER_SITE_OTHER): Payer: Federal, State, Local not specified - PPO | Admitting: Internal Medicine

## 2013-11-29 VITALS — BP 117/76 | HR 64 | Temp 98.0°F | Ht 67.0 in | Wt 276.1 lb

## 2013-11-29 DIAGNOSIS — K625 Hemorrhage of anus and rectum: Secondary | ICD-10-CM

## 2013-11-29 DIAGNOSIS — K219 Gastro-esophageal reflux disease without esophagitis: Secondary | ICD-10-CM

## 2013-11-29 DIAGNOSIS — R1310 Dysphagia, unspecified: Secondary | ICD-10-CM

## 2013-11-29 NOTE — Patient Instructions (Signed)
EGD/ED. The risks and benefits such as perforation, bleeding, and infection were reviewed with the patient and is agreeable. 

## 2013-11-29 NOTE — Progress Notes (Addendum)
Subjective:     Patient ID: Antonio Lucas, male   DOB: Oct 12, 1955, 58 y.o.   MRN: [id number]  HPI Here today with c/o of rectal bleeding off and on for 6 months to a year.  Last episode of rectal bleeding was about a week ago. Describes as bright red. Hx of internal and external hemorrhoids He has lost about 20 pounds intentionally. Appetite is good. He does have epigastric burning at times. He has acid reflux daily.  He is having 2-3 stools a day. Stools are light to dark brown in color. He sees blood in the commode and the toilet tissue.  He sometimes alternates between hard stools and slightly loose stools.  Stopped the Dexilant about a year ago and switched to Omeprazole. The Omperazole does not work as well.  Occasionally has dysphagia to liquids. Foods are slow to go down.   02/16/2012 Colonoscopy; hx of polyps; Dr.Rehman:  Impression:  Examination performed to cecum.  Scattered diverticula at sigmoid and descending colon.  Internal/external hemorrhoids.  No evidence of recurrent polyps.  He may be bleeding intermittently from hemorrhoids.   01/07/2012 EGD/ED: Impression:  Patchy whitish exudate coating esophageal mucosa. Brushing taken to rule out Candida esophagitis.  He was she identified gastric ulcers have completely healed.  Small sliding hiatal hernia without ring or stricture formation. Esophagus dilated by passing 56 French Maloney dilator given history of dysphagia.  Antral erosions most likely secondary to NSAIDs.        Review of Systems Past Medical History  Diagnosis Date  . Pneumonia 1995  . GERD (gastroesophageal reflux disease)   . Arthritis   . Hyperlipemia   . Hypertension     Past Surgical History  Procedure Laterality Date  . Back surgery      02.2011- HE HAD SOME NECK PROBLEMS WITH C7 .  . Carpal tunnel release      both arms  . Tonsillectomy    . Esophagogastroduodenoscopy (egd) with esophageal dilation  01/07/2012    Procedure:  ESOPHAGOGASTRODUODENOSCOPY (EGD) WITH ESOPHAGEAL DILATION;  Surgeon: Malissa Hippo, MD;  [facility]: [facility];  Service: Endoscopy;  Laterality: N/A;  145  . Colonoscopy  02/16/2012    Procedure: COLONOSCOPY;  Surgeon: Malissa Hippo, MD;  [facility]: [facility];  Service: Endoscopy;  Laterality: N/A;  730    Allergies  Allergen Reactions  . Fentanyl Nausea And Vomiting    Current Outpatient Prescriptions on File Prior to Visit  Medication Sig Dispense Refill  . ALPRAZolam (XANAX) 1 MG tablet Take 1 mg by mouth at bedtime as needed. Sleep/anxiety.      Marland Kitchen amLODipine (NORVASC) 10 MG tablet Take 10 mg by mouth daily.      Marland Kitchen aspirin EC 81 MG tablet Take 81 mg by mouth daily.      Marland Kitchen atorvastatin (LIPITOR) 40 MG tablet TAKE 1 TABLET EVERY DAY  15 tablet  0  . buPROPion (WELLBUTRIN XL) 300 MG 24 hr tablet Take 300 mg by mouth daily.      . finasteride (PROSCAR) 5 MG tablet Take 1 tablet by mouth daily.      . fluticasone (FLONASE) 50 MCG/ACT nasal spray Place 2 sprays into the nose daily.      . furosemide (LASIX) 20 MG tablet Take 20 mg by mouth daily.       Marland Kitchen HYDROcodone-acetaminophen (NORCO) 10-325 MG per tablet Take 1 tablet by mouth every 6 (six) hours as needed. Pain.      Marland Kitchen  lamoTRIgine (LAMICTAL) 100 MG tablet Take 100 mg by mouth 2 (two) times daily. 200mg  hs and 100mg  in am      . nebivolol (BYSTOLIC) 10 MG tablet Take 10 mg by mouth daily.      . tadalafil (CIALIS) 5 MG tablet Take 5 mg by mouth daily.      Marland Kitchen. testosterone cypionate (DEPOTESTOTERONE CYPIONATE) 200 MG/ML injection Inject 0.5 mLs into the muscle every 14 (fourteen) days.       . traZODone (DESYREL) 150 MG tablet Take 1 tablet by mouth at bedtime as needed.       . Vitamin D, Ergocalciferol, (DRISDOL) 50000 UNITS CAPS Take 1 capsule by mouth Once a week. sunday      . zolmitriptan (ZOMIG) 5 MG tablet Take 1 tablet by mouth every 2 (two) hours as needed for migraine.        No current facility-administered  medications on file prior to visit.        Objective:   Physical Exam  Filed Vitals:   11/29/13 1429  BP: 117/76  Pulse: 64  Temp: 98 F (36.7 C)  Height: 5\' 7"  (1.702 m)  Weight: 276 lb 1.6 oz (125.238 kg)   Alert and oriented. Skin warm and dry. Oral mucosa is moist.   . Sclera anicteric, conjunctivae is pink. Thyroid not enlarged. No cervical lymphadenopathy. Lungs clear. Heart regular rate and rhythm.  Abdomen is soft. Bowel sounds are positive. No hepatomegaly. No abdominal masses felt. No tenderness.  No edema to lower extremities. No stool and guaiac negative.     Assessment:     Rectal bleeding probable from hemorrhoids. GERD: PUD needs to be ruled out.  I discussed with Dr. Karilyn Cotaehman.      Plan:    CBC. EGD/ED. If patient is anemic will add a colonoscopy.

## 2013-11-30 LAB — CBC WITH DIFFERENTIAL/PLATELET
Basophils Absolute: 0.1 10*3/uL (ref 0.0–0.1)
Basophils Relative: 1 % (ref 0–1)
EOS PCT: 4 % (ref 0–5)
Eosinophils Absolute: 0.3 10*3/uL (ref 0.0–0.7)
HEMATOCRIT: 42.3 % (ref 39.0–52.0)
Hemoglobin: 14 g/dL (ref 13.0–17.0)
LYMPHS ABS: 1.9 10*3/uL (ref 0.7–4.0)
LYMPHS PCT: 23 % (ref 12–46)
MCH: 30.8 pg (ref 26.0–34.0)
MCHC: 33.1 g/dL (ref 30.0–36.0)
MCV: 93 fL (ref 78.0–100.0)
MONO ABS: 0.8 10*3/uL (ref 0.1–1.0)
MONOS PCT: 9 % (ref 3–12)
Neutro Abs: 5.3 10*3/uL (ref 1.7–7.7)
Neutrophils Relative %: 63 % (ref 43–77)
Platelets: 195 10*3/uL (ref 150–400)
RBC: 4.55 MIL/uL (ref 4.22–5.81)
RDW: 13.3 % (ref 11.5–15.5)
WBC: 8.4 10*3/uL (ref 4.0–10.5)

## 2013-12-14 ENCOUNTER — Encounter (HOSPITAL_COMMUNITY)
Admission: RE | Disposition: A | Discharge status: Home / Self Care | Payer: Self-pay | Source: Ambulatory Visit | Attending: Internal Medicine

## 2013-12-14 ENCOUNTER — Encounter (HOSPITAL_COMMUNITY): Payer: Self-pay | Admitting: *Deleted

## 2013-12-14 ENCOUNTER — Ambulatory Visit (HOSPITAL_COMMUNITY)
Admission: RE | Admit: 2013-12-14 | Discharge: 2013-12-14 | Disposition: A | Discharge status: Home / Self Care | Payer: Federal, State, Local not specified - PPO | Source: Ambulatory Visit | Attending: Internal Medicine | Admitting: Internal Medicine

## 2013-12-14 DIAGNOSIS — R1310 Dysphagia, unspecified: Secondary | ICD-10-CM

## 2013-12-14 DIAGNOSIS — K219 Gastro-esophageal reflux disease without esophagitis: Secondary | ICD-10-CM

## 2013-12-14 DIAGNOSIS — K2960 Other gastritis without bleeding: Secondary | ICD-10-CM

## 2013-12-14 DIAGNOSIS — E785 Hyperlipidemia, unspecified: Secondary | ICD-10-CM | POA: Diagnosis not present

## 2013-12-14 DIAGNOSIS — Z888 Allergy status to other drugs, medicaments and biological substances status: Secondary | ICD-10-CM | POA: Insufficient documentation

## 2013-12-14 DIAGNOSIS — I10 Essential (primary) hypertension: Secondary | ICD-10-CM | POA: Diagnosis not present

## 2013-12-14 DIAGNOSIS — M1990 Unspecified osteoarthritis, unspecified site: Secondary | ICD-10-CM | POA: Diagnosis not present

## 2013-12-14 DIAGNOSIS — K449 Diaphragmatic hernia without obstruction or gangrene: Secondary | ICD-10-CM

## 2013-12-14 HISTORY — PX: ESOPHAGOGASTRODUODENOSCOPY: SHX5428

## 2013-12-14 HISTORY — PX: MALONEY DILATION: SHX5535

## 2013-12-14 SURGERY — EGD (ESOPHAGOGASTRODUODENOSCOPY)
Anesthesia: Moderate Sedation

## 2013-12-14 MED ORDER — MEPERIDINE HCL 50 MG/ML IJ SOLN
INTRAMUSCULAR | Status: AC
  Filled 2013-12-14: qty 1

## 2013-12-14 MED ORDER — STERILE WATER FOR IRRIGATION IR SOLN
Status: DC | PRN
  Administered 2013-12-14: 10:00:00

## 2013-12-14 MED ORDER — MIDAZOLAM HCL 5 MG/5ML IJ SOLN
INTRAMUSCULAR | Status: DC | PRN
  Administered 2013-12-14 (×2): 3 mg via INTRAVENOUS

## 2013-12-14 MED ORDER — MEPERIDINE HCL 50 MG/ML IJ SOLN
INTRAMUSCULAR | Status: DC | PRN
  Administered 2013-12-14 (×2): 25 mg via INTRAVENOUS

## 2013-12-14 MED ORDER — MIDAZOLAM HCL 5 MG/5ML IJ SOLN
INTRAMUSCULAR | Status: AC
  Filled 2013-12-14: qty 10

## 2013-12-14 MED ORDER — BUTAMBEN-TETRACAINE-BENZOCAINE 2-2-14 % EX AERO
INHALATION_SPRAY | CUTANEOUS | Status: DC | PRN
  Administered 2013-12-14: 2 via TOPICAL

## 2013-12-14 MED ORDER — SODIUM CHLORIDE 0.9 % IV SOLN
INTRAVENOUS | Status: DC
  Administered 2013-12-14: 10:00:00 via INTRAVENOUS

## 2013-12-14 NOTE — Discharge Instructions (Signed)
Resume usual medications and diet. Remember to chew food thoroughly and eat slowly. No driving for 24 hours. Please call office for progress report in one week.

## 2013-12-14 NOTE — H&P (Addendum)
Antonio Lucas is an 58 y.o. male.   Chief Complaint: Patient is here for EGD and ED. HPI: Patient is a 58 year old Caucasian male with multiple medical problems who presents with dysphagia primarily to solids but tonsillar crypts. Started 6 months ago. His esophagus was dilated in November 2013 and he did well but 6 months ago. He has heartburn intermittently but less than 3 times a week while on PPI. He has intermittent hematochezia felt to be secondary to hemorrhoids. He denies abdominal pain or melena. He is on low-dose aspirin but does not take other OTC NSAIDs. He has history of peptic ulcer disease. Ulcers in order to have healed the last EGD of November 2013.  Past Medical History  Diagnosis Date  . Pneumonia 1995  . GERD (gastroesophageal reflux disease)   . Arthritis   . Hyperlipemia   . Hypertension       situational depression.  Past Surgical History  Procedure Laterality Date  . Back surgery      02.2011- HE HAD SOME NECK PROBLEMS WITH C7 .  . Carpal tunnel release      both arms  . Tonsillectomy    . Esophagogastroduodenoscopy (egd) with esophageal dilation  01/07/2012    Procedure: ESOPHAGOGASTRODUODENOSCOPY (EGD) WITH ESOPHAGEAL DILATION;  Surgeon: Antonio HippoNajeeb U Cardell Rachel, MD;  Location: [facility];  Service: Endoscopy;  Laterality: N/A;  145  . Colonoscopy  02/16/2012    Procedure: COLONOSCOPY;  Surgeon: Antonio HippoNajeeb U Bernardino Dowell, MD;  [facility]: [facility];  Service: Endoscopy;  Laterality: N/A;  730    History reviewed. No pertinent family history. Social History:  reports that he quit smoking about 6 years ago. His smoking use included Cigarettes. He started smoking about 51 years ago. He has a 80 pack-year smoking history. He has never used smokeless tobacco. He reports that he drinks alcohol. He reports that he does not use illicit drugs.  Allergies:  Allergies  Allergen Reactions  . Fentanyl Nausea And Vomiting    Medications Prior to Admission  Medication Sig Dispense  Refill  . ALPRAZolam (XANAX) 1 MG tablet Take 1 mg by mouth at bedtime as needed. Sleep/anxiety.      Marland Kitchen. amLODipine (NORVASC) 10 MG tablet Take 10 mg by mouth daily.      Marland Kitchen. aspirin EC 81 MG tablet Take 81 mg by mouth daily.      Marland Kitchen. atorvastatin (LIPITOR) 40 MG tablet TAKE 1 TABLET EVERY DAY  15 tablet  0  . buPROPion (WELLBUTRIN XL) 300 MG 24 hr tablet Take 300 mg by mouth daily.      . finasteride (PROSCAR) 5 MG tablet Take 1 tablet by mouth daily.      . fluticasone (FLONASE) 50 MCG/ACT nasal spray Place 2 sprays into the nose daily.      . furosemide (LASIX) 20 MG tablet Take 20 mg by mouth daily.       Marland Kitchen. HYDROcodone-acetaminophen (NORCO) 10-325 MG per tablet Take 1 tablet by mouth every 6 (six) hours as needed. Pain.      . lamoTRIgine (LAMICTAL) 100 MG tablet Take 100 mg by mouth 2 (two) times daily. 200mg  hs and 100mg  in am      . nebivolol (BYSTOLIC) 10 MG tablet Take 10 mg by mouth daily.      Marland Kitchen. omeprazole (PRILOSEC) 40 MG capsule Take 40 mg by mouth daily.      . QUEtiapine Fumarate (SEROQUEL XR) 150 MG 24 hr tablet Take 150 mg by mouth at  bedtime.      . tadalafil (CIALIS) 5 MG tablet Take 5 mg by mouth daily.      . traZODone (DESYREL) 150 MG tablet Take 1 tablet by mouth at bedtime as needed.       . Vilazodone HCl (VIIBRYD) 20 MG TABS Take 20 mg by mouth daily.       . Vitamin D, Ergocalciferol, (DRISDOL) 50000 UNITS CAPS Take 1 capsule by mouth Once a week. sunday      . zolmitriptan (ZOMIG) 5 MG tablet Take 1 tablet by mouth every 2 (two) hours as needed for migraine.       Marland Kitchen. testosterone cypionate (DEPOTESTOTERONE CYPIONATE) 200 MG/ML injection Inject 0.5 mLs into the muscle every 14 (fourteen) days.         No results found for this or any previous visit (from the past 48 hour(s)). No results found.  ROS  Blood pressure 118/73, pulse 59, temperature 97.9 F (36.6 C), temperature source Oral, resp. rate 15, SpO2 96.00%. Physical Exam  Constitutional: He appears  well-developed and well-nourished.  HENT:  Mouth/Throat: Oropharynx is clear and moist.  Eyes: Conjunctivae are normal. No scleral icterus.  Neck: No thyromegaly present.  Cardiovascular: Normal rate, regular rhythm and normal heart sounds.   No murmur heard. Respiratory: Effort normal and breath sounds normal.  GI: Soft. He exhibits no mass. There is no tenderness. There is no rebound.  Musculoskeletal: Edema: trace edema around ankles.  Lymphadenopathy:    He has no cervical adenopathy.  Neurological: He is alert.  Skin: Skin is warm and dry.     Assessment/Plan Dysphagia primarily to solids. Chronic GERD. EGD with ED.  Jonea Bukowski U 12/14/2013, 10:17 AM

## 2013-12-14 NOTE — Op Note (Signed)
EGD PROCEDURE REPORT  PATIENT:  Antonio Lucas  MR#:  [id number] Birthdate:  1955/09/03, 58 y.o., male Endoscopist:  Dr. Malissa HippoNajeeb U. Rehman, MD Referred By:  Dr. Josue Hectorobert Leonard Nyland,, MD Procedure Date: 12/14/2013  Procedure:   EGD with ED.  Indications:  Patient is 58 year old Caucasian male with multiple medical problems who also has chronic GERD and presents with dysphagia primarily to solids occasionally to liquids. His esophagus was last dilated in November 2015 with symptomatic improvement. Dysphagia started 6 months ago and has been gradually getting worse. He has history of peptic ulcer disease with GI bleed. He was noted to have healed ulcers back in November 2013. He denies taking NSAIDs other than low-dose aspirin.            Informed Consent:  The risks, benefits, alternatives & imponderables which include, but are not limited to, bleeding, infection, perforation, drug reaction and potential missed lesion have been reviewed.  The potential for biopsy, lesion removal, esophageal dilation, etc. have also been discussed.  Questions have been answered.  All parties agreeable.  Please see history & physical in medical Lucas for more information.  Medications:  Demerol 50 mg IV Versed 6 mg IV Cetacaine spray topically for oropharyngeal anesthesia  Description of procedure:  The endoscope was introduced through the mouth and advanced to the second portion of the duodenum without difficulty or limitations. The mucosal surfaces were surveyed very carefully during advancement of the scope and upon withdrawal.  Findings:  Esophagus:  Mucosa of the esophagus was normal. GE junction was unremarkable without ring or stricture formation. GEJ:  39 cm Hiatus:  41 cm Stomach:  Stomach was empty and distended very well with insufflation. Folds in the proximal stomach were normal. Examination of mucosa at gastric body was normal. Patchy erythema noted to antral mucosa but no erosions or ulcers  present. Pyloric channel was patent. Angularis fundus and cardia were examined by retroflexing the scope and were normal. Duodenum:  Normal bulbar and post bulbar mucosa.  Therapeutic/Diagnostic Maneuvers Performed:   Esophagus dilated by passing 56 French Maloney dilator to full insertion. Esophageal mucosa was examined post dilation and no mucosal disruption noted.  Complications:  None  Impression: Small sliding hiatal hernia without ring or stricture formation. Focal antral erythema but no evidence of recurrent peptic ulcer. Esophagus dilated by passing 56 French Maloney dilator but no mucosal disruption noted.  Recommendations:  Standard instructions given. Patient advised to chew food thoroughly and eat slowly. 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  REHMAN,NAJEEB U  12/14/2013  10:53 AM  CC: Dr. Josue HectorNYLAND,LEONARD ROBERT, MD & Dr. Bonnetta BarryNo ref. provider found

## 2013-12-16 ENCOUNTER — Encounter (HOSPITAL_COMMUNITY): Payer: Self-pay | Admitting: Internal Medicine

## 2013-12-26 ENCOUNTER — Ambulatory Visit (INDEPENDENT_AMBULATORY_CARE_PROVIDER_SITE_OTHER): Payer: Federal, State, Local not specified - PPO | Admitting: Cardiovascular Disease

## 2013-12-26 ENCOUNTER — Encounter: Payer: Self-pay | Admitting: Cardiovascular Disease

## 2013-12-26 ENCOUNTER — Telehealth: Payer: Self-pay | Admitting: Cardiovascular Disease

## 2013-12-26 ENCOUNTER — Encounter: Payer: Self-pay | Admitting: *Deleted

## 2013-12-26 VITALS — BP 105/68 | HR 56 | Ht 67.0 in | Wt 283.8 lb

## 2013-12-26 DIAGNOSIS — Z87891 Personal history of nicotine dependence: Secondary | ICD-10-CM

## 2013-12-26 DIAGNOSIS — I739 Peripheral vascular disease, unspecified: Secondary | ICD-10-CM

## 2013-12-26 DIAGNOSIS — I10 Essential (primary) hypertension: Secondary | ICD-10-CM

## 2013-12-26 DIAGNOSIS — I872 Venous insufficiency (chronic) (peripheral): Secondary | ICD-10-CM

## 2013-12-26 DIAGNOSIS — R0602 Shortness of breath: Secondary | ICD-10-CM

## 2013-12-26 DIAGNOSIS — Z8249 Family history of ischemic heart disease and other diseases of the circulatory system: Secondary | ICD-10-CM

## 2013-12-26 DIAGNOSIS — R079 Chest pain, unspecified: Secondary | ICD-10-CM

## 2013-12-26 NOTE — Telephone Encounter (Signed)
No precert required 

## 2013-12-26 NOTE — Telephone Encounter (Signed)
-   Lexiscan Cardiolite - same as above  Scheduled at [facility] 11/2 register at 8:45 at radiology

## 2013-12-26 NOTE — Progress Notes (Signed)
Patient ID: Antonio Lucas, male   DOB: 1955-07-10, 58 y.o.   MRN: [id number]      SUBJECTIVE: The patient is a 58 year old male with a history of hypertension, hyperlipidemia, chronic GERD, recent esophageal dilatation by Dr. Karilyn Cota, sleep apnea, who has a strong family history of premature coronary artery disease. He had a stress test in 2011 that was reportedly normal. He has chronic lower extremity edema and venous insufficiency, and takes Lasix for this. He has been experiencing intermittent chest pains which have not been relieved with esophageal dilatation. He has been having an aching in his left arm and left calf and he has been awoken by the left arm aching from his sleep. This has been going on for the past 1-1/2 years. He describes it as a throbbing which occurs at least twice a week. He quit smoking approximately 6 years ago but began smoking from childhood and smoked for at least 40 years. He has been prescribed compression stockings but does not want to spend the $40 for them. He believes his leg swelling has been gradually improving. He denies lightheadedness, dizziness, orthopnea, paroxysmal nocturnal dyspnea, and syncope.  ECG performed in the office today demonstrates sinus bradycardia, heart rate 55 bpm, first-degree AV block with PR interval 234 ms. QRS duration 100 ms.  He provides a very interesting and detailed family history with regards to his father. Since the age of 13, he had episodes where he appeared as though he were dead with no discernible pulse were respiratory activity. He was even taken to the funeral home and was about to be an bombed when he suddenly woke up. The patient's son has a history of Ebstein's anomaly. The patient says that none of his uncles lived beyond the age of 33-50   PMH: 1. OSA: On CPAP  2. H/o L-spine surgery  3. HTN  4. GERD  5. Nuclear stress test in 2011 was normal.  6. Hyperlipidemia  7. Venous insufficiency  8. Obesity   SH: Lives  in [place], quit smoking in 2008, 4 children. 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   FH: Father with first MI at 51 (died at 24), uncle with MI at 70, other uncles with MIs in their 28s and 41s.    Review of Systems: As per "subjective", otherwise negative.  Allergies  Allergen Reactions  . Fentanyl Nausea And Vomiting    Current Outpatient Prescriptions  Medication Sig Dispense Refill  . ALPRAZolam (XANAX) 1 MG tablet Take 1 mg by mouth at bedtime as needed. Sleep/anxiety.      Marland Kitchen amLODipine (NORVASC) 10 MG tablet Take 10 mg by mouth daily.      Marland Kitchen aspirin EC 81 MG tablet Take 81 mg by mouth daily.      Marland Kitchen atorvastatin (LIPITOR) 40 MG tablet TAKE 1 TABLET EVERY DAY  15 tablet  0  . buPROPion (WELLBUTRIN XL) 300 MG 24 hr tablet Take 300 mg by mouth daily.      Marland Kitchen dexlansoprazole (DEXILANT) 60 MG capsule Take 60 mg by mouth daily.      . finasteride (PROSCAR) 5 MG tablet Take 1 tablet by mouth daily.      . fluticasone (FLONASE) 50 MCG/ACT nasal spray Place 2 sprays into the nose daily.      . furosemide (LASIX) 20 MG tablet Take 20 mg by mouth daily.       Marland Kitchen HYDROcodone-acetaminophen (NORCO) 10-325 MG per tablet Take 1 tablet by mouth every 6 (six)  hours as needed. Pain.      . lamoTRIgine (LAMICTAL) 100 MG tablet 200mg  hs and 100mg  in am      . nebivolol (BYSTOLIC) 10 MG tablet Take 10 mg by mouth daily.      . QUEtiapine Fumarate (SEROQUEL XR) 150 MG 24 hr tablet Take 150 mg by mouth at bedtime.      . tadalafil (CIALIS) 5 MG tablet Take 5 mg by mouth daily.      Marland Kitchen. testosterone cypionate (DEPOTESTOTERONE CYPIONATE) 200 MG/ML injection Inject 0.5 mLs into the muscle every 14 (fourteen) days.       . traZODone (DESYREL) 150 MG tablet Take 1 tablet by mouth at bedtime as needed.       . Vilazodone HCl (VIIBRYD) 20 MG TABS Take 20 mg by mouth daily.       . Vitamin D, Ergocalciferol, (DRISDOL) 50000 UNITS CAPS Take 1 capsule by mouth Once a week. sunday      . zolmitriptan (ZOMIG) 5 MG  tablet Take 1 tablet by mouth every 2 (two) hours as needed for migraine.       . omeprazole (PRILOSEC) 40 MG capsule Take 40 mg by mouth 2 (two) times daily. Does when not taking the Dexilant       No current facility-administered medications for this visit.    Past Medical History  Diagnosis Date  . Pneumonia 1995  . GERD (gastroesophageal reflux disease)   . Arthritis   . Hyperlipemia   . Hypertension     Past Surgical History  Procedure Laterality Date  . Back surgery      02 .2011- HE HAD SOME NECK PROBLEMS WITH C7 .  . Carpal tunnel release      both arms  . Tonsillectomy    . Esophagogastroduodenoscopy (egd) with esophageal dilation  01/07/2012    Procedure: ESOPHAGOGASTRODUODENOSCOPY (EGD) WITH ESOPHAGEAL DILATION;  Surgeon: Malissa HippoNajeeb U Rehman, MD;  [facility];  Service: Endoscopy;  Laterality: N/A;  145  . Colonoscopy  02/16/2012    Procedure: COLONOSCOPY;  Surgeon: Malissa HippoNajeeb U Rehman, MD;  [facility];  Service: Endoscopy;  Laterality: N/A;  730  . Esophagogastroduodenoscopy N/A 12/14/2013    Procedure: ESOPHAGOGASTRODUODENOSCOPY (EGD);  Surgeon: Malissa HippoNajeeb U Rehman, MD;  [facility];  Service: Endoscopy;  Laterality: N/A;  1030  . Maloney dilation N/A 12/14/2013    Procedure: Elease HashimotoMALONEY DILATION;  Surgeon: Malissa HippoNajeeb U Rehman, MD;  [facility];  Service: Endoscopy;  Laterality: N/A;    History   Social History  . Marital Status: Married    Spouse Name: N/A    Number of Children: N/A  . Years of Education: N/A   Occupational History  . Not on file.   Social History Main Topics  . Smoking status: Former Smoker -- 2.00 packs/day for 40 years    Types: Cigarettes    Start date: 03/03/1962    Quit date: 03/04/2007  . Smokeless tobacco: Never Used  . Alcohol Use: Yes     Comment: glass of wine daily  . Drug Use: No  . Sexual Activity: Not on file   Other Topics Concern  . Not on file   Social History Narrative   He has 3  children  With one son with Epsteins anomaly. He ihas has surgical surgical repair . Peyton NajjarLarry stopped smoking 3 years ago one time , he smoked 2-3 packs per day. He will drink one glass of wine per day. He works  on mail processing equipment.     Filed Vitals:   12/26/13 1033  BP: 105/68  Pulse: 56  Height: 5\' 7"  (1.702 m)  Weight: 283 lb 12.8 oz (128.731 kg)  SpO2: 94%    PHYSICAL EXAM General: NAD, obese HEENT: Normal. Neck: No JVD, no thyromegaly. Lungs: Clear to auscultation bilaterally with normal respiratory effort. CV: Nondisplaced PMI.  Regular rate and rhythm, normal S1/S2, no S3/S4, no murmur. No pretibial or periankle edema.  Chronic venous stasis dermatitis. No carotid bruit.  Normal pedal pulses.  Abdomen: Soft, nontender, obese, no distention.  Neurologic: Alert and oriented.  Psych: Normal affect. Skin: Chronic venous stasis dermatitis. Musculoskeletal: Normal range of motion, no gross deformities. Extremities: No clubbing or cyanosis.   ECG: Most recent ECG reviewed.      ASSESSMENT AND PLAN: 1. Chest pain and shortness of breath: Given his symptoms and strong family history of premature coronary artery disease, as well as his cardiovascular risk factors which include hypertension, hyperlipidemia, prior history of tobacco abuse, and obesity, I will obtain an echocardiogram and Lexiscan Cardiolite stress test for further clarification. Continue aspirin 81 mg daily, Bystolic 10 mg daily, and Lipitor 40 mg daily. 2. Hyperlipidemia: Will obtain copy of lipids from PCP. Continue Lipitor 40 mg daily. 3. Essential HTN: BP is under good control. Continue amlodipine 10 mg daily and Bystolic10 mg daily. 4. Lower extremity edema/chronic venous insufficiency: I suspect a major cause is chronic venous insufficiency in the setting of abdominal obesity. I will get an echocardiogram to make sure that LV and RV function appear normal. Weight loss will likely help. I would not increase  Lasix further as JVP is not elevated. I did recommend use of compression stockings. 5. Obesity: Patient needs weight loss.  6. PVD: Will obtain ABI's given symptoms of claudication and prior h/o tobacco abuse.  Dispo: f/u 6 weeks.  Time spent: 40 minutes, of which >50% spent counseling pt regarding risk of CAD with family history and CV risk factors, as well as rationale for testing.  Prentice DockerSuresh Syana Degraffenreid, M.D., F.A.C.C.

## 2013-12-26 NOTE — Patient Instructions (Signed)
Your physician has requested that you have an ankle brachial index (ABI). During this test an ultrasound and blood pressure cuff are used to evaluate the arteries that supply the arms and legs with blood. Allow thirty minutes for this exam. There are no restrictions or special instructions. Your physician has requested that you have an echocardiogram. Echocardiography is a painless test that uses sound waves to create images of your heart. It provides your doctor with information about the size and shape of your heart and how well your heart's chambers and valves are working. This procedure takes approximately one hour. There are no restrictions for this procedure. Your physician has requested that you have a lexiscan myoview. For further information please visit [email or web address]. Please follow instruction sheet, as given. Office will contact with results via phone or letter.   Continue all current medications. Follow up in  6 weeks

## 2014-01-02 ENCOUNTER — Encounter (HOSPITAL_COMMUNITY): Payer: Self-pay

## 2014-01-02 ENCOUNTER — Encounter (HOSPITAL_COMMUNITY)
Admission: RE | Admit: 2014-01-02 | Discharge: 2014-01-02 | Disposition: A | Discharge status: Home / Self Care | Payer: Federal, State, Local not specified - PPO | Source: Ambulatory Visit | Attending: Cardiovascular Disease | Admitting: Cardiovascular Disease

## 2014-01-02 ENCOUNTER — Ambulatory Visit (HOSPITAL_COMMUNITY)
Admission: RE | Admit: 2014-01-02 | Discharge: 2014-01-02 | Disposition: A | Discharge status: Home / Self Care | Payer: Federal, State, Local not specified - PPO | Source: Ambulatory Visit | Attending: Cardiovascular Disease | Admitting: Cardiovascular Disease

## 2014-01-02 DIAGNOSIS — R079 Chest pain, unspecified: Secondary | ICD-10-CM | POA: Insufficient documentation

## 2014-01-02 DIAGNOSIS — R1310 Dysphagia, unspecified: Secondary | ICD-10-CM | POA: Diagnosis not present

## 2014-01-02 DIAGNOSIS — R0602 Shortness of breath: Secondary | ICD-10-CM | POA: Insufficient documentation

## 2014-01-02 IMAGING — NM NM MYOCAR MULTI W/SPECT W/WALL MOTION & EF
2 series · 12 of 12 positions shown · non-contrast
Comparison: None.

CLINICAL DATA: 57-year-old male with no known history of coronary
artery disease referred for chest pain.

EXAM:
MYOCARDIAL IMAGING WITH SPECT (REST AND PHARMACOLOGIC-STRESS)
GATED LEFT VENTRICULAR WALL MOTION STUDY
LEFT VENTRICULAR EJECTION FRACTION
TECHNIQUE: Standard myocardial SPECT imaging was performed after resting
intravenous injection of 10 mCi [J0] sestamibi. Subsequently,
intravenous infusion of Lexiscan was performed under the supervision
of the Cardiology staff. At peak effect of the drug, 30 mCi [J0]
sestamibi was injected intravenously and standard myocardial SPECT
imaging was performed. Quantitative gated imaging was also performed
to evaluate left ventricular wall motion, and estimate left
ventricular ejection fraction.

[Series 1: rest · 8.28mm/px · 6 of 64 frames shown]
[frame 6/64]
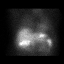
[frame 16/64]
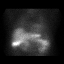
[frame 27/64]
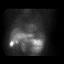
[frame 38/64]
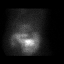
[frame 48/64]
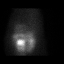
[frame 59/64]
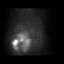

[Series 3: stress gated - perfusion · 8.28mm/px · 6 of 64 frames shown]
[frame 6/64]
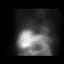
[frame 16/64]
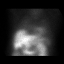
[frame 27/64]
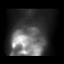
[frame 38/64]
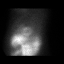
[frame 48/64]
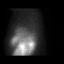
[frame 59/64]
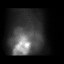

[12 of 12 positions shown; findings below may reference images not displayed]

FINDINGS: Pharmacological stress

Baseline EKG shows normal sinus rhythm. After injection heart rate
increased from 53 beats per min up to 66 beats per min, and resting
blood pressure decreased from 111/63 down to 104/59. The test was
stopped after injection was complete, the patient did not experience
any chest pain. Post-injection EKG showed no specific ischemic
changes and no significant arrhythmias.

Perfusion: There is a large moderate intensity inferior wall and
inferoapical wall defect seen in the resting images. A similar yet
less intense defect is seen in the post-injection images. The
inferior and inferoapical walls have normal wall motion. Findings
most consistent with sub- diaphragmatic attenuation, there is also
radiotracer uptake in the adjacent gut to these wall segments that
may also be contributing to the defect.

Wall Motion: Normal left ventricular wall motion. No left
ventricular dilation.

Left Ventricular Ejection Fraction: 61 %

End diastolic volume 127 ml

End systolic volume 49 ml
IMPRESSION: 1. No reversible ischemia or infarction.

2. Normal left ventricular wall motion.

3. Left ventricular ejection fraction 61%

4. Low-risk stress test findings*.

*[J0] Appropriate Use Criteria for Coronary Revascularization
Focused Update: J Am Coll Cardiol. [J0];59(9):857-881.
[URL]

## 2014-01-02 MED ORDER — REGADENOSON 0.4 MG/5ML IV SOLN
0.4000 mg | Freq: Once | INTRAVENOUS | Status: AC | PRN
  Administered 2014-01-02: 0.4 mg via INTRAVENOUS

## 2014-01-02 MED ORDER — TECHNETIUM TC 99M SESTAMIBI - CARDIOLITE
30.0000 | Freq: Once | INTRAVENOUS | Status: AC | PRN
  Administered 2014-01-02: 11:00:00 30 via INTRAVENOUS

## 2014-01-02 MED ORDER — REGADENOSON 0.4 MG/5ML IV SOLN
INTRAVENOUS | Status: AC
  Administered 2014-01-02: 0.4 mg via INTRAVENOUS
  Filled 2014-01-02: qty 5

## 2014-01-02 MED ORDER — SODIUM CHLORIDE 0.9 % IJ SOLN
INTRAMUSCULAR | Status: AC
  Administered 2014-01-02: 10 mL via INTRAVENOUS
  Filled 2014-01-02: qty 10

## 2014-01-02 MED ORDER — TECHNETIUM TC 99M SESTAMIBI GENERIC - CARDIOLITE
10.0000 | Freq: Once | INTRAVENOUS | Status: AC | PRN
  Administered 2014-01-02: 10 via INTRAVENOUS

## 2014-01-02 MED ORDER — SODIUM CHLORIDE 0.9 % IJ SOLN
10.0000 mL | INTRAMUSCULAR | Status: DC | PRN
  Administered 2014-01-02: 10 mL via INTRAVENOUS
  Filled 2014-01-02: qty 10

## 2014-01-02 NOTE — Progress Notes (Signed)
Stress Lab Nurses Notes - Antonio Lucas  Alvy BimlerLarry R Strebeck 01/02/2014 Reason for doing test: Chest Pain and Dyspnea Type of test: Marlane HatcherLexiscan Cardiolite Nurse performing test: Parke PoissonPhyllis Billingsly, RN Nuclear Medicine Tech: Marcella DubsMiranda Womack Echo Tech: Not Applicable MD performing test: Branch/K.Lyman BishopLawrence NP Family MD: Nyland Test explained and consent signed: Yes.   IV started: Saline lock flushed, No redness or edema and Saline lock started in radiology Symptoms: Nausea Treatment/Intervention: None Reason test stopped: protocol completed After recovery IV was: Discontinued via X-ray tech and No redness or edema Patient to return to Nuc. Med at : 11:30 Patient discharged: Home Patient's Condition upon discharge was: stable Comments: During test BP 104/59 & HR 66.  Recovery BP 96/59 & HR 58.  Symptoms resolved in recovery. Erskine SpeedBillingsley, Britiany Silbernagel T

## 2014-01-03 ENCOUNTER — Encounter: Payer: Self-pay | Admitting: *Deleted

## 2014-01-04 ENCOUNTER — Other Ambulatory Visit (HOSPITAL_COMMUNITY): Payer: Self-pay | Admitting: Cardiology

## 2014-01-04 DIAGNOSIS — R0600 Dyspnea, unspecified: Secondary | ICD-10-CM

## 2014-01-05 ENCOUNTER — Encounter (INDEPENDENT_AMBULATORY_CARE_PROVIDER_SITE_OTHER): Payer: Federal, State, Local not specified - PPO

## 2014-01-05 ENCOUNTER — Other Ambulatory Visit (INDEPENDENT_AMBULATORY_CARE_PROVIDER_SITE_OTHER): Payer: Federal, State, Local not specified - PPO

## 2014-01-05 ENCOUNTER — Other Ambulatory Visit: Payer: Self-pay

## 2014-01-05 ENCOUNTER — Telehealth: Payer: Self-pay | Admitting: *Deleted

## 2014-01-05 DIAGNOSIS — I739 Peripheral vascular disease, unspecified: Secondary | ICD-10-CM

## 2014-01-05 DIAGNOSIS — R0600 Dyspnea, unspecified: Secondary | ICD-10-CM

## 2014-01-05 DIAGNOSIS — R079 Chest pain, unspecified: Secondary | ICD-10-CM

## 2014-01-05 DIAGNOSIS — R0602 Shortness of breath: Secondary | ICD-10-CM

## 2014-01-05 DIAGNOSIS — M79605 Pain in left leg: Secondary | ICD-10-CM

## 2014-01-05 NOTE — Telephone Encounter (Signed)
Care Everywhere investigation for recent LIPIDS as Dr. Lysbeth GalasNyland is part of [facility] now.  Dr. Purvis SheffieldKoneswaran - see care everywhere.

## 2014-01-09 ENCOUNTER — Telehealth: Payer: Self-pay | Admitting: *Deleted

## 2014-01-09 NOTE — Telephone Encounter (Signed)
Notes Recorded by Lesle ChrisAngela G Hill, LPN on 16/1/096011/11/2013 at 10:15 AM Patient notified. Already has follow up scheduled for 02/06/2014 with Dr. Purvis SheffieldKoneswaran.

## 2014-01-09 NOTE — Telephone Encounter (Signed)
-----   Message from Laqueta LindenSuresh A Koneswaran, MD sent at 01/09/2014  8:31 AM EST ----- Please inform pt that circulation appears to be good.

## 2014-02-06 ENCOUNTER — Ambulatory Visit: Payer: Federal, State, Local not specified - PPO | Admitting: Cardiovascular Disease

## 2014-02-27 ENCOUNTER — Encounter: Payer: Self-pay | Admitting: Cardiovascular Disease

## 2014-02-27 ENCOUNTER — Ambulatory Visit (INDEPENDENT_AMBULATORY_CARE_PROVIDER_SITE_OTHER): Payer: Federal, State, Local not specified - PPO | Admitting: Cardiovascular Disease

## 2014-02-27 VITALS — BP 156/97 | HR 69 | Ht 67.0 in | Wt 276.0 lb

## 2014-02-27 DIAGNOSIS — R079 Chest pain, unspecified: Secondary | ICD-10-CM

## 2014-02-27 DIAGNOSIS — I10 Essential (primary) hypertension: Secondary | ICD-10-CM

## 2014-02-27 DIAGNOSIS — R0602 Shortness of breath: Secondary | ICD-10-CM

## 2014-02-27 DIAGNOSIS — Z87891 Personal history of nicotine dependence: Secondary | ICD-10-CM

## 2014-02-27 DIAGNOSIS — Z8249 Family history of ischemic heart disease and other diseases of the circulatory system: Secondary | ICD-10-CM

## 2014-02-27 DIAGNOSIS — IMO0001 Reserved for inherently not codable concepts without codable children: Secondary | ICD-10-CM

## 2014-02-27 DIAGNOSIS — I872 Venous insufficiency (chronic) (peripheral): Secondary | ICD-10-CM

## 2014-02-27 DIAGNOSIS — Z136 Encounter for screening for cardiovascular disorders: Secondary | ICD-10-CM

## 2014-02-27 MED ORDER — ISOSORBIDE MONONITRATE ER 30 MG PO TB24: 30.0000 mg | ORAL_TABLET | Freq: Every day | ORAL | Status: DC

## 2014-02-27 NOTE — Progress Notes (Signed)
Patient ID: Alvy BimlerLarry R Gadomski, male   DOB: 17-Mar-1955, 58 y.o.   MRN: [id number]      SUBJECTIVE: The patient returns for follow-up after undergoing cardiovascular testing for chest pain in the setting of a strong family history of premature coronary artery disease. Nuclear stress imaging was normal. Echocardiography demonstrated normal left ventricular systolic function, EF 60-65%, mild LVH with diastolic dysfunction. Lower extremity ABIs were unremarkable. His blood pressure normally runs in the 110-115/60-65 range. However in the past 2-3 weeks, it has remained elevated. He has had intermittent chest pains which have been mild and occasionally radiates to the left arm. He tries to eat a healthy diet, one low in saturated fat.   Review of Systems: As per "subjective", otherwise negative.  Allergies  Allergen Reactions  . Fentanyl Nausea And Vomiting    Current Outpatient Prescriptions  Medication Sig Dispense Refill  . ALPRAZolam (XANAX) 1 MG tablet Take 1 mg by mouth at bedtime as needed. Sleep/anxiety.    Marland Kitchen. amLODipine (NORVASC) 10 MG tablet Take 10 mg by mouth daily.    Marland Kitchen. aspirin EC 81 MG tablet Take 81 mg by mouth daily.    Marland Kitchen. atorvastatin (LIPITOR) 40 MG tablet TAKE 1 TABLET EVERY DAY 15 tablet 0  . buPROPion (WELLBUTRIN XL) 300 MG 24 hr tablet Take 300 mg by mouth daily.    . finasteride (PROSCAR) 5 MG tablet Take 1 tablet by mouth daily.    . fluticasone (FLONASE) 50 MCG/ACT nasal spray Place 2 sprays into the nose daily.    . furosemide (LASIX) 20 MG tablet Take 20 mg by mouth daily.     Marland Kitchen. lamoTRIgine (LAMICTAL) 100 MG tablet 200mg  hs and 100mg  in am    . nebivolol (BYSTOLIC) 10 MG tablet Take 10 mg by mouth daily.    Marland Kitchen. omeprazole (PRILOSEC) 40 MG capsule Take 40 mg by mouth 2 (two) times daily. Does when not taking the Dexilant    . QUEtiapine Fumarate (SEROQUEL XR) 150 MG 24 hr tablet Take 150 mg by mouth at bedtime.    . tadalafil (CIALIS) 5 MG tablet Take 5 mg by mouth daily.     Marland Kitchen. testosterone cypionate (DEPOTESTOTERONE CYPIONATE) 200 MG/ML injection Inject 0.5 mLs into the muscle every 14 (fourteen) days.     . Vilazodone HCl (VIIBRYD) 20 MG TABS Take 20 mg by mouth daily.     . Vitamin D, Ergocalciferol, (DRISDOL) 50000 UNITS CAPS Take 1 capsule by mouth Once a week. sunday    . zolmitriptan (ZOMIG) 5 MG tablet Take 1 tablet by mouth every 2 (two) hours as needed for migraine.      No current facility-administered medications for this visit.    Past Medical History  Diagnosis Date  . Pneumonia 1995  . GERD (gastroesophageal reflux disease)   . Arthritis   . Hyperlipemia   . Hypertension     Past Surgical History  Procedure Laterality Date  . Back surgery      02 .2011- HE HAD SOME NECK PROBLEMS WITH C7 .  . Carpal tunnel release      both arms  . Tonsillectomy    . Esophagogastroduodenoscopy (egd) with esophageal dilation  01/07/2012    Procedure: ESOPHAGOGASTRODUODENOSCOPY (EGD) WITH ESOPHAGEAL DILATION;  Surgeon: Malissa HippoNajeeb U Rehman, MD;  [facility];  Service: Endoscopy;  Laterality: N/A;  145  . Colonoscopy  02/16/2012    Procedure: COLONOSCOPY;  Surgeon: Malissa HippoNajeeb U Rehman, MD;  [facility];  Service: Endoscopy;  Laterality: N/A;  730  . Esophagogastroduodenoscopy N/A 12/14/2013    Procedure: ESOPHAGOGASTRODUODENOSCOPY (EGD);  Surgeon: Malissa HippoNajeeb U Rehman, MD;  [facility];  Service: Endoscopy;  Laterality: N/A;  1030  . Maloney dilation N/A 12/14/2013    Procedure: Elease HashimotoMALONEY DILATION;  Surgeon: Malissa HippoNajeeb U Rehman, MD;  [facility];  Service: Endoscopy;  Laterality: N/A;    History   Social History  . Marital Status: Married    Spouse Name: N/A    Number of Children: N/A  . Years of Education: N/A   Occupational History  . Not on file.   Social History Main Topics  . Smoking status: Former Smoker -- 2.00 packs/day for 40 years    Types: Cigarettes    Start date: 03/03/1962    Quit date: 03/04/2007  .  Smokeless tobacco: Never Used  . Alcohol Use: 0.0 oz/week    0 Not specified per week     Comment: glass of wine daily  . Drug Use: No  . Sexual Activity: Not on file   Other Topics Concern  . Not on file   Social History Narrative   He has 3 children  With one son with Epsteins anomaly. He ihas has surgical surgical repair . Peyton NajjarLarry stopped smoking 3 years ago one time , he smoked 2-3 packs per day. He will drink one glass of wine per day. 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     Filed Vitals:   02/27/14 0848 02/27/14 0856  BP: 164/107 156/97  Pulse: 69   Height: 5\' 7"  (1.702 m)   Weight: 276 lb (125.193 kg)   SpO2: 97%     PHYSICAL EXAM General: NAD, obese HEENT: Normal. Neck: No JVD, no thyromegaly. Lungs: Clear to auscultation bilaterally with normal respiratory effort. CV: Nondisplaced PMI. Regular rate and rhythm, normal S1/S2, no S3/S4, no murmur. No pretibial or periankle edema. Chronic venous stasis dermatitis. No carotid bruit. Normal pedal pulses.  Abdomen: Soft, nontender, obese, no distention.  Neurologic: Alert and oriented.  Psych: Normal affect. Skin: Chronic venous stasis dermatitis. Musculoskeletal: Normal range of motion, no gross deformities. Extremities: No clubbing or cyanosis.   ECG: Most recent ECG reviewed.      ASSESSMENT AND PLAN: 1. Chest pain and shortness of breath: Normal stress test as noted above. Will start Imdur 30 mg daily. Continue aspirin 81 mg daily, Bystolic 10 mg daily, and Lipitor 40 mg daily. 2. Hyperlipidemia: Stable. Continue Lipitor 40 mg daily. 3. Essential HTN: BP is elevated. Taking amlodipine 10 mg daily and Bystolic10 mg daily. I will monitor this as I am starting Imdur. If it remains elevated at next visit, I would consider the addition of an ACEI. He adheres to a low sodium diet. 4. Lower extremity edema/chronic venous insufficiency: I suspect a major cause is chronic venous insufficiency in the setting of  abdominal obesity. Weight loss will likely help. I would not increase Lasix further as JVP is not elevated. I previously recommended the use of compression stockings. 5. Obesity: Patient needs weight loss.  6. PVD: Unremarkable ABI results.  Dispo: f/u 3 months.   Prentice DockerSuresh Yuvia Plant, M.D., F.A.C.C.

## 2014-02-27 NOTE — Patient Instructions (Signed)
   Begin Imdur 30mg  daily - new sent to pharm Continue all other medications.   Follow up in  3 months

## 2014-02-28 ENCOUNTER — Telehealth: Payer: Self-pay | Admitting: Cardiovascular Disease

## 2014-02-28 NOTE — Telephone Encounter (Signed)
Mr. Lurene ShadowLaprade called stating that he became sick last evening after taking isosorbide mononitrate (IMDUR) 30 MG 24 hr tablet. He said that he call EMS. He said he was feeling so bad that he would have to call the office back.

## 2014-03-01 NOTE — Telephone Encounter (Signed)
02/28/14 - 5:00 >  Message left on voice mail stating that he had terrible reaction to the Imdur.  Terrible headache that went into his neck and shoulders.  Also, vomiting.  Did call EMS around 4 am.  Stated he did remember that he had been on some type of nitrate years ago that give him similar reaction.  Stated he did stop medication on his own & did not work this day due to that.  Feeling better off medication.    03/01/14 - 3:25 - attempted to return call - left message.

## 2014-03-06 NOTE — Telephone Encounter (Signed)
See below, do you suggest different medication or any changes to any of his other medications.  Not due to see you again till March 2016.

## 2014-03-07 NOTE — Telephone Encounter (Signed)
Patient informed and verbalized understanding of plan. 

## 2014-03-07 NOTE — Telephone Encounter (Signed)
No need to start other meds at this time. Will just monitor symptoms and reassess at next visit.

## 2014-05-10 ENCOUNTER — Ambulatory Visit (INDEPENDENT_AMBULATORY_CARE_PROVIDER_SITE_OTHER): Payer: Federal, State, Local not specified - PPO | Admitting: Cardiovascular Disease

## 2014-05-10 ENCOUNTER — Encounter: Payer: Self-pay | Admitting: Cardiovascular Disease

## 2014-05-10 VITALS — BP 99/64 | HR 62 | Ht 67.0 in | Wt 282.0 lb

## 2014-05-10 DIAGNOSIS — Z8249 Family history of ischemic heart disease and other diseases of the circulatory system: Secondary | ICD-10-CM

## 2014-05-10 DIAGNOSIS — Z7182 Exercise counseling: Secondary | ICD-10-CM

## 2014-05-10 DIAGNOSIS — R079 Chest pain, unspecified: Secondary | ICD-10-CM

## 2014-05-10 DIAGNOSIS — Z87891 Personal history of nicotine dependence: Secondary | ICD-10-CM

## 2014-05-10 DIAGNOSIS — I10 Essential (primary) hypertension: Secondary | ICD-10-CM

## 2014-05-10 DIAGNOSIS — I872 Venous insufficiency (chronic) (peripheral): Secondary | ICD-10-CM

## 2014-05-10 DIAGNOSIS — Z719 Counseling, unspecified: Secondary | ICD-10-CM

## 2014-05-10 DIAGNOSIS — Z713 Dietary counseling and surveillance: Secondary | ICD-10-CM

## 2014-05-10 MED ORDER — RANOLAZINE ER 500 MG PO TB12: 500.0000 mg | ORAL_TABLET | Freq: Two times a day (BID) | ORAL | Status: DC

## 2014-05-10 NOTE — Progress Notes (Signed)
Patient ID: Antonio Lucas, male   DOB: 1955-03-10, 59 y.o.   MRN: [id number]      SUBJECTIVE: The patient returns for follow-up of chest pain in the context of a strong family history of premature coronary artery disease, with a prior history of tobacco abuse. He underwent a normal nuclear stress test on 01/02/14. Echocardiogram demonstrated normal left ventricular systolic function, EF 60-65%. I previously prescribed Imdur but he did not tolerate it. He is here with his wife of 8 years. He continues to have atypical symptoms with precordial chest pain radiating into the left arm but this never occurs with exertion. It has awoken him at night. He has been struggling to lose weight and has tried Weight Watchers without any reasonable success. He tries to eat a healthy diet. His physical activity has been limited by lumbar back pain for which she has undergone surgeries in the past. He has several questions with regards to a heart healthy diet and reasonable exercise. He also asked about the possibility of bariatric surgery. He denies exertional dyspnea as well as dizziness and syncope.    Review of Systems: As per "subjective", otherwise negative.  Allergies  Allergen Reactions  . Imdur [Isosorbide] Nausea Only    Headache also   . Fentanyl Nausea And Vomiting    Current Outpatient Prescriptions  Medication Sig Dispense Refill  . ALPRAZolam (XANAX) 1 MG tablet Take 1 mg by mouth at bedtime as needed. Sleep/anxiety.    Marland Kitchen amLODipine (NORVASC) 10 MG tablet Take 10 mg by mouth daily.    Marland Kitchen aspirin EC 81 MG tablet Take 81 mg by mouth daily.    Marland Kitchen atorvastatin (LIPITOR) 40 MG tablet TAKE 1 TABLET EVERY DAY 15 tablet 0  . furosemide (LASIX) 20 MG tablet Take 20 mg by mouth daily.     . nebivolol (BYSTOLIC) 10 MG tablet Take 10 mg by mouth daily.    Marland Kitchen omeprazole (PRILOSEC) 40 MG capsule Take 40 mg by mouth 2 (two) times daily. Does when not taking the Dexilant    . tadalafil (CIALIS) 5 MG tablet  Take 5 mg by mouth daily.    Marland Kitchen testosterone cypionate (DEPOTESTOTERONE CYPIONATE) 200 MG/ML injection Inject 0.5 mLs into the muscle every 14 (fourteen) days.     . Vitamin D, Ergocalciferol, (DRISDOL) 50000 UNITS CAPS Take 1 capsule by mouth Once a week. .2011- HE HAD SOME NECK PROBLEMS WITH C7 .  . Carpal tunnel release      both arms  . Tonsillectomy    . Esophagogastroduodenoscopy (egd) with esophageal dilation  01/07/2012    Procedure: ESOPHAGOGASTRODUODENOSCOPY (EGD) WITH ESOPHAGEAL DILATION;  Surgeon: Malissa Hippo, MD;  [facility];  Service: Endoscopy;  Laterality: N/A;  145  . Colonoscopy  02/16/2012    Procedure: COLONOSCOPY;  Surgeon: Malissa Hippo, MD;  [facility];  Service: Endoscopy;  Laterality: N/A;  730  . Esophagogastroduodenoscopy N/A 12/14/2013    Procedure: ESOPHAGOGASTRODUODENOSCOPY (EGD);  Surgeon: Ok Anis  Cline CrockU Rehman, MD;  [facility];  Service: Endoscopy;  Laterality: N/A;  1030  . Maloney dilation N/A 12/14/2013    Procedure: Elease HashimotoMALONEY DILATION;  Surgeon: Malissa HippoNajeeb U Rehman, MD;  [facility];  Service: Endoscopy;  Laterality: N/A;    History   Social History  . Marital Status: Married    Spouse Name: N/A  . Number of Children: N/A  . Years of Education: N/A   Occupational History  . Not on file.   Social History Main Topics  . Smoking status: Former Smoker -- 2.00 packs/day for 40 years    Types: Cigarettes    Start date: 03/03/1962    Quit date: 03/04/2007  . Smokeless tobacco: Never Used  . Alcohol  Use: 0.0 oz/week    0 Standard drinks or equivalent per week     Comment: glass of wine daily  . Drug Use: No  . Sexual Activity: Not on file   Other Topics Concern  . Not on file   Social History Narrative   He has 3 children  With one son with Epsteins anomaly. He ihas has surgical surgical repair . Peyton NajjarLarry stopped smoking 3 years ago one time , he smoked 2-3 packs per day. He will drink one glass of wine per day. 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     Filed Vitals:   05/10/14 1547  BP: 99/64  Pulse: 62  Height: 5\' 7"  (1.702 m)  Weight: 282 lb (127.914 kg)  SpO2: 92%    PHYSICAL EXAM General: NAD, obese HEENT: Normal. Neck: No JVD, no thyromegaly. Lungs: Clear to auscultation bilaterally with normal respiratory effort. CV: Nondisplaced PMI. Regular rate and rhythm, normal S1/S2, no S3/S4, no murmur. No pretibial or periankle edema. Chronic venous stasis dermatitis. No carotid bruit. Abdomen: Firm, nontender, obese, no distention.  Neurologic: Alert and oriented.  Psych: Normal affect. Skin: Chronic venous stasis dermatitis. Musculoskeletal: Normal range of motion, no gross deformities. Extremities: No clubbing or cyanosis.   ECG: Most recent ECG reviewed.    ASSESSMENT AND PLAN: 1. Chest pain: Normal stress test in 11/15 as noted above. Did not tolerate Imdur 30. I will try Ranexa 500 mg bid. Continue aspirin 81 mg daily, Bystolic 10 mg daily, and Lipitor 40 mg daily. 2. Hyperlipidemia: Will obtain copy of lipids. Continue Lipitor 40 mg daily. 3. Essential HTN: Low normal on current therapy. No changes today. 4. Lower extremity edema/chronic venous insufficiency: I suspect a major cause is chronic venous insufficiency in the setting of abdominal obesity. Weight loss will likely help and I had a long discussion with him about exercise and diet, encouraging activities in the pool. I would not increase Lasix further as JVP is not elevated. I previously recommended  the use of compression stockings. 5. Obesity: Patient needs weight loss. We had a lengthy discussion about exercise modification and I suggested pool activities. He is considering bariatric surgery. If he were to proceed, I would consider coronary angiography beforehand.  6. PVD: Unremarkable ABI results.  Dispo: f/u 3 months.  Time spent: 40 minutes, of which greater than 50% was spent reviewing symptoms, relevant blood tests and studies, and discussing management plan with the patient.   Prentice DockerSuresh Azhia Siefken, M.D., F.A.C.C.

## 2014-05-10 NOTE — Patient Instructions (Signed)
Your physician recommends that you schedule a follow-up appointment in: 3 months with Dr. Purvis SheffieldKoneswaran  Your physician has recommended you make the following change in your medication:   RANEXA 500 MG TWICE DAILY  CONTINUE ALL OTHER MEDICATIONS AS DIRECTED  Thank you for choosing [facility]!!

## 2014-05-11 ENCOUNTER — Encounter: Payer: Self-pay | Admitting: *Deleted

## 2014-07-03 ENCOUNTER — Other Ambulatory Visit: Payer: Self-pay | Admitting: Cardiology

## 2014-08-21 ENCOUNTER — Ambulatory Visit (INDEPENDENT_AMBULATORY_CARE_PROVIDER_SITE_OTHER): Payer: Federal, State, Local not specified - PPO | Admitting: Cardiovascular Disease

## 2014-08-21 ENCOUNTER — Encounter: Payer: Self-pay | Admitting: Cardiovascular Disease

## 2014-08-21 VITALS — BP 101/65 | HR 60 | Ht 67.0 in | Wt 280.0 lb

## 2014-08-21 DIAGNOSIS — I10 Essential (primary) hypertension: Secondary | ICD-10-CM

## 2014-08-21 DIAGNOSIS — I872 Venous insufficiency (chronic) (peripheral): Secondary | ICD-10-CM

## 2014-08-21 DIAGNOSIS — Z719 Counseling, unspecified: Secondary | ICD-10-CM

## 2014-08-21 DIAGNOSIS — Z7182 Exercise counseling: Secondary | ICD-10-CM

## 2014-08-21 DIAGNOSIS — R0602 Shortness of breath: Secondary | ICD-10-CM

## 2014-08-21 DIAGNOSIS — Z8249 Family history of ischemic heart disease and other diseases of the circulatory system: Secondary | ICD-10-CM | POA: Diagnosis not present

## 2014-08-21 DIAGNOSIS — Z87891 Personal history of nicotine dependence: Secondary | ICD-10-CM

## 2014-08-21 DIAGNOSIS — R079 Chest pain, unspecified: Secondary | ICD-10-CM

## 2014-08-21 NOTE — Addendum Note (Signed)
Addended by: Lesle Chris on: 08/21/2014 04:14 PM   Modules accepted: Level of Service

## 2014-08-21 NOTE — Progress Notes (Addendum)
Patient ID: Antonio Lucas, male   DOB: Aug 16, 1955, 59 y.o.   MRN: [id number]      SUBJECTIVE: The patient returns for follow-up of chest pain in the context of a strong family history of premature coronary artery disease, with a prior history of tobacco abuse. He underwent a normal nuclear stress test on 01/02/14. Echocardiogram demonstrated normal left ventricular systolic function, EF 60-65%. I previously prescribed Imdur but he did not tolerate it. I started Ranexa at his last visit in March.  After starting Ranexa, he felt a significant improvement in his symptoms and his ability to perform routine daily activities. However because of the high cost associated with this medication, he stopped taking it. He feels markedly fatigued after mowing a small amount of grass but did say that he works 12 hour shifts and is able to use the tiller without problems. His wife is concerned about his health.   Review of Systems: As per "subjective", otherwise negative.  Allergies  Allergen Reactions  . Imdur [Isosorbide] Nausea Only    Headache also   . Fentanyl Nausea And Vomiting    Current Outpatient Prescriptions  Medication Sig Dispense Refill  . ALPRAZolam (XANAX) 1 MG tablet Take 1 mg by mouth at bedtime as needed. Sleep/anxiety.    Marland Kitchen amLODipine (NORVASC) 10 MG tablet Take 5 mg by mouth daily.     Marland Kitchen aspirin EC 81 MG tablet Take 81 mg by mouth daily.    Marland Kitchen atorvastatin (LIPITOR) 40 MG tablet TAKE 1 TABLET EVERY DAY 15 tablet 0  . furosemide (LASIX) 20 MG tablet Take 40 mg by mouth daily.     . nebivolol (BYSTOLIC) 10 MG tablet Take 10 mg by mouth daily.    Marland Kitchen omeprazole (PRILOSEC) 40 MG capsule Take 40 mg by mouth 2 (two) times daily. Does when not taking the Dexilant    . tadalafil (CIALIS) 5 MG tablet Take 5 mg by mouth daily.    Marland Kitchen testosterone cypionate (DEPOTESTOTERONE CYPIONATE) 200 MG/ML injection Inject 0.5 mLs into the muscle every 14 (fourteen) days.     . Vitamin D, Ergocalciferol,  (DRISDOL) 50000 UNITS CAPS Take 1 capsule by mouth Once a week. sunday    . zolmitriptan (ZOMIG) 5 MG tablet Take 1 tablet by mouth every 2 (two) hours as needed for migraine.     . ranolazine (RANEXA) 500 MG 12 hr tablet Take 1 tablet (500 mg total) by mouth 2 (two) times daily. (Patient not taking: Reported on 08/21/2014) 180 tablet 3   No current facility-administered medications for this visit.    Past Medical History  Diagnosis Date  . Pneumonia 1995  . GERD (gastroesophageal reflux disease)   . Arthritis   . Hyperlipemia   . Hypertension     Past Surgical History  Procedure Laterality Date  . Back surgery      02 .2011- HE HAD SOME NECK PROBLEMS WITH C7 .  . Carpal tunnel release      both arms  . Tonsillectomy    . Esophagogastroduodenoscopy (egd) with esophageal dilation  01/07/2012    Procedure: ESOPHAGOGASTRODUODENOSCOPY (EGD) WITH ESOPHAGEAL DILATION;  Surgeon: Malissa Hippo, MD;  [facility];  Service: Endoscopy;  Laterality: N/A;  145  . Colonoscopy  02/16/2012    Procedure: COLONOSCOPY;  Surgeon: Malissa Hippo, MD;  [facility];  Service: Endoscopy;  Laterality: N/A;  730  . Esophagogastroduodenoscopy N/A 12/14/2013    Procedure: ESOPHAGOGASTRODUODENOSCOPY (EGD);  Surgeon: Malissa Hippo,  MD;  [facility];  Service: Endoscopy;  Laterality: N/A;  1030  . Maloney dilation N/A 12/14/2013    Procedure: Elease Hashimoto DILATION;  Surgeon: Malissa Hippo, MD;  [facility];  Service: Endoscopy;  Laterality: N/A;    History   Social History  . Marital Status: Married    Spouse Name: N/A  . Number of Children: N/A  . Years of Education: N/A   Occupational History  . Not on file.   Social History Main Topics  . Smoking status: Former Smoker -- 2.00 packs/day for 40 years    Types: Cigarettes    Start date: 03/03/1962    Quit date: 03/04/2007  . Smokeless tobacco: Never Used  . Alcohol Use: 0.0 oz/week    0 Standard  drinks or equivalent per week     Comment: glass of wine daily  . Drug Use: No  . Sexual Activity: Not on file   Other Topics Concern  . Not on file   Social History Narrative   He has 3 children  With one son with Epsteins anomaly. He ihas has surgical surgical repair . [place] stopped smoking 3 years ago one time , he smoked 2-3 packs per day. He will drink one glass of wine per day. He works on [employer].     Filed Vitals:   08/21/14 1517  BP: 101/65  Pulse: 60  Height:  (1.702 m)  Weight: 280 lb (127.007 kg)    PHYSICAL EXAM General: NAD, obese HEENT: Normal. Neck: No JVD, no thyromegaly. Lungs: Clear to auscultation bilaterally with normal respiratory effort. CV: Nondisplaced PMI. Regular rate and rhythm, normal S1/S2, no S3/S4, no murmur. No pretibial or periankle edema. Chronic venous stasis dermatitis. No carotid bruit. Abdomen: Firm, nontender, obese, no distention.  Neurologic: Alert and oriented.  Psych: Normal affect. Skin: Chronic venous stasis dermatitis. Musculoskeletal: Normal range of motion, no gross deformities. Extremities: No clubbing or cyanosis.    ECG: Most recent ECG reviewed.      ASSESSMENT AND PLAN: 1. Chest pain: Normal stress test in 11/15 as noted above. Did not tolerate Imdur 30. Symptom relief with Ranexa 500 mg bid but unable to afford. Will provide samples. Once these run out and if he still cannot afford, would consider trying short-acting isosorbide dinitrate 10 mg tid. Continue aspirin 81 mg daily, Bystolic 10 mg daily, and Lipitor 40 mg daily. If symptoms are not alleviated, would proceed with coronary angiography.  2. Hyperlipidemia: Will obtain copy of lipids. Continue Lipitor 40 mg daily.  3. Essential HTN: Low normal on current therapy. No changes today.  4. Lower extremity edema/chronic venous insufficiency: I suspect a major cause is chronic venous insufficiency in the setting of abdominal obesity.  Weight loss will likely help and I previously had a long discussion with him about exercise and diet, encouraging activities in the pool. I would not increase Lasix further as JVP is not elevated. I previously recommended the use of compression stockings.  5. Obesity: Patient needs weight loss. We previously had a lengthy discussion about exercise modification and I suggested pool activities. He is still considering bariatric surgery. If he were to proceed, I would consider coronary angiography beforehand.   6. PVD: Previously unremarkable ABI results.  Dispo: f/u 3 months.    Prentice Docker, M.D., F.A.C.C.

## 2014-08-21 NOTE — Patient Instructions (Addendum)
   Continue Ranexa 500mg  twice a day - office supply given today.   Continue all other medications.   Follow up in  3 months

## 2014-08-24 ENCOUNTER — Encounter: Payer: Self-pay | Admitting: *Deleted

## 2014-11-03 ENCOUNTER — Encounter: Payer: Self-pay | Admitting: Cardiovascular Disease

## 2014-11-03 ENCOUNTER — Ambulatory Visit (INDEPENDENT_AMBULATORY_CARE_PROVIDER_SITE_OTHER): Payer: Federal, State, Local not specified - PPO | Admitting: Cardiovascular Disease

## 2014-11-03 VITALS — BP 122/81 | HR 67 | Ht 67.0 in | Wt 291.0 lb

## 2014-11-03 DIAGNOSIS — I872 Venous insufficiency (chronic) (peripheral): Secondary | ICD-10-CM | POA: Diagnosis not present

## 2014-11-03 DIAGNOSIS — I10 Essential (primary) hypertension: Secondary | ICD-10-CM

## 2014-11-03 DIAGNOSIS — Z719 Counseling, unspecified: Secondary | ICD-10-CM

## 2014-11-03 DIAGNOSIS — Z8249 Family history of ischemic heart disease and other diseases of the circulatory system: Secondary | ICD-10-CM | POA: Diagnosis not present

## 2014-11-03 DIAGNOSIS — Z7182 Exercise counseling: Secondary | ICD-10-CM

## 2014-11-03 DIAGNOSIS — R079 Chest pain, unspecified: Secondary | ICD-10-CM

## 2014-11-03 DIAGNOSIS — I739 Peripheral vascular disease, unspecified: Secondary | ICD-10-CM

## 2014-11-03 DIAGNOSIS — Z87891 Personal history of nicotine dependence: Secondary | ICD-10-CM

## 2014-11-03 DIAGNOSIS — Z713 Dietary counseling and surveillance: Secondary | ICD-10-CM

## 2014-11-03 DIAGNOSIS — R600 Localized edema: Secondary | ICD-10-CM

## 2014-11-03 MED ORDER — VALSARTAN 80 MG PO TABS: 80.0000 mg | ORAL_TABLET | Freq: Every day | ORAL | Status: DC

## 2014-11-03 NOTE — Progress Notes (Signed)
Patient ID: Antonio Lucas, male   DOB: [birth year], 59 y.o.   MRN: [id number]      SUBJECTIVE: The patient returns for follow-up of chest pain in the context of a strong family history of premature coronary artery disease, with a prior history of tobacco abuse. He underwent a normal nuclear stress test on 01/02/14. Echocardiogram demonstrated normal left ventricular systolic function, EF 60-65%. I previously prescribed Imdur but he did not tolerate it. I started Ranexa at his last visit in March 2016  After starting Ranexa, he felt a significant improvement in his symptoms and his ability to perform routine daily activities. However because of the high cost associated with this medication, he initially stopped taking it.  His insurance company is now paying for it and he is taking 500 mg twice daily. His chest pain is much less severe in intensity and less in frequency , but still experiences it at least 3 or 4 days per week.   He takes 60 mg of Lasix daily and in between doses his weight may fluctuate by 8-15 pounds with noticeable bilateral leg edema. He thought some of this may have been due to amlodipine. He stopped taking it altogether but his PCP told him to start taking at least 5 mg daily.   He wants to lose weight. His weight significantly increased after quitting smoking. His wife tries to cook healthy foods. She says he has been found "sleep eating" where he will binge eat at night, but he has no recollection of this.   Once he retires, they would like to travel and his wife is very concerned about his health. She wants him to lose weight as well.    Review of Systems: As per "subjective", otherwise negative.  Allergies  Allergen Reactions  . Imdur [Isosorbide] Nausea Only    Headache also   . Fentanyl Nausea And Vomiting    Current Outpatient Prescriptions  Medication Sig Dispense Refill  . amLODipine (NORVASC) 10 MG tablet Take 5 mg by mouth daily.     Marland Kitchen aspirin EC 81 MG  tablet Take 81 mg by mouth daily.    Marland Kitchen atorvastatin (LIPITOR) 40 MG tablet TAKE 1 TABLET EVERY DAY 15 tablet 0  . furosemide (LASIX) 20 MG tablet Take 40 mg by mouth daily.     . furosemide (LASIX) 40 MG tablet Take 1 tablet by mouth daily.  5  . nebivolol (BYSTOLIC) 10 MG tablet Take 10 mg by mouth daily.    Marland Kitchen omeprazole (PRILOSEC) 40 MG capsule Take 40 mg by mouth 2 (two) times daily.    . ranolazine (RANEXA) 500 MG 12 hr tablet Take 500 mg by mouth 2 (two) times daily.    . tadalafil (CIALIS) 5 MG tablet Take 5 mg by mouth daily.    Marland Kitchen testosterone cypionate (DEPOTESTOTERONE CYPIONATE) 200 MG/ML injection Inject 0.5 mLs into the muscle every 14 (fourteen) days.     . Vitamin D, Ergocalciferol, (DRISDOL) 50000 UNITS CAPS Take 1 capsule by mouth Once a week. sunday    . zolmitriptan (ZOMIG) 5 MG tablet Take 1 tablet by mouth every 2 (two) hours as needed for migraine.      No current facility-administered medications for this visit.    Past Medical History  Diagnosis Date  . Pneumonia 1995  . GERD (gastroesophageal reflux disease)   . Arthritis   . Hyperlipemia   . Hypertension     Past Surgical History  Procedure Laterality Date  . Back  surgery      02.2011- HE HAD SOME NECK PROBLEMS WITH C7 .  . Carpal tunnel release      both arms  . Tonsillectomy    . Esophagogastroduodenoscopy (egd) with esophageal dilation  01/07/2012    Procedure: ESOPHAGOGASTRODUODENOSCOPY (EGD) WITH ESOPHAGEAL DILATION;  Surgeon: Malissa Hippo, MD;  [facility];  Service: Endoscopy;  Laterality: N/A;  145  . Colonoscopy  02/16/2012    Procedure: COLONOSCOPY;  Surgeon: Malissa Hippo, MD;  [facility];  Service: Endoscopy;  Laterality: N/A;  730  . Esophagogastroduodenoscopy N/A 12/14/2013    Procedure: ESOPHAGOGASTRODUODENOSCOPY (EGD);  Surgeon: Malissa Hippo, MD;  [facility];  Service: Endoscopy;  Laterality: N/A;  1030  . Maloney dilation N/A 12/14/2013     Procedure: Elease Hashimoto DILATION;  Surgeon: Malissa Hippo, MD;  [facility];  Service: Endoscopy;  Laterality: N/A;    Social History   Social History  . Marital Status: Married    Spouse Name: N/A  . Number of Children: N/A  . Years of Education: N/A   Occupational History  . Not on file.   Social History Main Topics  . Smoking status: Former Smoker -- 2.00 packs/day for 40 years    Types: Cigarettes    Start date: 03/03/1962    Quit date: 03/04/2007  . Smokeless tobacco: Never Used  . Alcohol Use: 0.0 oz/week    0 Standard drinks or equivalent per week     Comment: glass of wine daily  . Drug Use: No  . Sexual Activity: Not on file   Other Topics Concern  . Not on file   Social History Narrative   He has 3 children  With one son with Epsteins anomaly. He ihas has surgical surgical repair . Auryn stopped smoking 3 years ago one time , he smoked 2-3 packs per day. He will drink one glass of wine per day. He works on [employer].     Filed Vitals:   11/03/14 1553  BP: 122/81  Pulse: 67  Height: 5\' 7"  (1.702 m)  Weight: 291 lb (131.997 kg)    PHYSICAL EXAM General: NAD, obese HEENT: Normal. Neck: No JVD, no thyromegaly. Lungs: Clear to auscultation bilaterally with normal respiratory effort. CV: Nondisplaced PMI. Regular rate and rhythm, normal S1/S2, no S3/S4, no murmur. Trivial bilateral pretibial and periankle edema. Chronic venous stasis dermatitis. No carotid bruit. Abdomen: Firm, nontender, obese, no distention.  Neurologic: Alert and oriented.  Psych: Normal affect. Skin: Chronic venous stasis dermatitis. Musculoskeletal: Normal range of motion, no gross deformities. Extremities: No clubbing or cyanosis.   ECG: Most recent ECG reviewed.      ASSESSMENT AND PLAN: 1. Chest pain: Normal stress test in 11/15 as noted above. Did not tolerate Imdur 30. Symptom relief with Ranexa 500 mg bid. Continue aspirin 81 mg daily,  Bystolic 10 mg daily, and Lipitor 40 mg daily.  I recommend increasing Ranexa to 1000 mg bid (therapeutic dose studied), but he wants to wait to see what his insurance company says next week. If symptoms are not alleviated, would proceed with coronary angiography. Weight loss encouraged.  2. Hyperlipidemia:  Lipids on 05/15/14 showed total cholesterol 228, triglycerides 154, HDL 49, LDL 148. Dietary modification and weight loss is strongly encouraged. Continue Lipitor 40 mg daily.  3. Essential HTN: Normal on current therapy. However, due to lower extremity edema, will stop amlodipine and start valsartan 80 mg daily. On 05/15/14,  BUN 18 and creatinine 0.95.  4. Lower extremity edema/chronic venous insufficiency: I suspect a major cause is chronic venous insufficiency in the setting of abdominal obesity. Weight increased by 11 lbs since last visit on 08/21/14.  Weight loss will likely help and I again had a long discussion with him about exercise and diet, encouraging activities in the pool. I would not increase Lasix further as JVP is not elevated. I previously recommended the use of compression stockings. Will stop amlodipine to see if this is contributing.  5. Obesity: Weight increased by 11 lbs since last visit on 08/21/14. Patient needs weight loss. We again had a lengthy discussion about exercise modification and I suggested pool activities. He is still considering bariatric surgery. If he were to proceed, I would consider coronary angiography beforehand.   6. PVD: Previously unremarkable ABI results.  Dispo: f/u 6 months.  Time spent: 40 minutes, of which greater than 50% was spent reviewing symptoms, relevant blood tests and studies, and discussing management plan with the patient.   Prentice Docker, M.D., F.A.C.C.

## 2014-11-03 NOTE — Patient Instructions (Addendum)
   Stop Amlodipine  Begin Valsartan  daily - new sent to [facility] today. Continue all other medications.   Your physician wants you to follow up in: 6 months.  You will receive a reminder letter in the mail one-two months in advance.  If you don't receive a letter, please call our office to schedule the follow up appointment

## 2014-11-23 ENCOUNTER — Ambulatory Visit: Payer: Federal, State, Local not specified - PPO | Admitting: Cardiovascular Disease

## 2014-11-30 ENCOUNTER — Ambulatory Visit (INDEPENDENT_AMBULATORY_CARE_PROVIDER_SITE_OTHER): Payer: Federal, State, Local not specified - PPO | Admitting: Adult Health

## 2014-11-30 ENCOUNTER — Other Ambulatory Visit (HOSPITAL_COMMUNITY)
Admission: RE | Admit: 2014-11-30 | Discharge: 2014-11-30 | Disposition: A | Discharge status: Home / Self Care | Payer: Federal, State, Local not specified - PPO | Source: Ambulatory Visit | Attending: Adult Health | Admitting: Adult Health

## 2014-11-30 ENCOUNTER — Encounter: Payer: Self-pay | Admitting: Adult Health

## 2014-11-30 ENCOUNTER — Encounter: Payer: Self-pay | Admitting: Cardiovascular Disease

## 2014-11-30 ENCOUNTER — Ambulatory Visit: Payer: Federal, State, Local not specified - PPO | Admitting: Adult Health

## 2014-11-30 ENCOUNTER — Encounter: Payer: Self-pay | Admitting: *Deleted

## 2014-11-30 ENCOUNTER — Ambulatory Visit (HOSPITAL_COMMUNITY)
Admission: RE | Admit: 2014-11-30 | Discharge: 2014-11-30 | Disposition: A | Discharge status: Home / Self Care | Payer: Federal, State, Local not specified - PPO | Source: Ambulatory Visit | Attending: Adult Health | Admitting: Adult Health

## 2014-11-30 VITALS — BP 122/72 | HR 66 | Ht 67.0 in | Wt 279.6 lb

## 2014-11-30 DIAGNOSIS — Z136 Encounter for screening for cardiovascular disorders: Secondary | ICD-10-CM

## 2014-11-30 DIAGNOSIS — Z01818 Encounter for other preprocedural examination: Secondary | ICD-10-CM

## 2014-11-30 DIAGNOSIS — Z029 Encounter for administrative examinations, unspecified: Secondary | ICD-10-CM | POA: Insufficient documentation

## 2014-11-30 DIAGNOSIS — I10 Essential (primary) hypertension: Secondary | ICD-10-CM

## 2014-11-30 DIAGNOSIS — Z87891 Personal history of nicotine dependence: Secondary | ICD-10-CM | POA: Diagnosis not present

## 2014-11-30 DIAGNOSIS — Z01811 Encounter for preprocedural respiratory examination: Secondary | ICD-10-CM | POA: Diagnosis not present

## 2014-11-30 DIAGNOSIS — R0602 Shortness of breath: Secondary | ICD-10-CM | POA: Diagnosis not present

## 2014-11-30 DIAGNOSIS — R079 Chest pain, unspecified: Secondary | ICD-10-CM | POA: Insufficient documentation

## 2014-11-30 DIAGNOSIS — I200 Unstable angina: Secondary | ICD-10-CM | POA: Diagnosis not present

## 2014-11-30 LAB — BASIC METABOLIC PANEL
ANION GAP: 5 (ref 5–15)
BUN: 25 mg/dL — ABNORMAL HIGH (ref 6–20)
CO2: 27 mmol/L (ref 22–32)
Calcium: 8.9 mg/dL (ref 8.9–10.3)
Chloride: 104 mmol/L (ref 101–111)
Creatinine, Ser: 1.38 mg/dL — ABNORMAL HIGH (ref 0.61–1.24)
GFR, EST NON AFRICAN AMERICAN: 54 mL/min — AB (ref >60)
GLUCOSE: 103 mg/dL — AB (ref 65–99)
POTASSIUM: 3.5 mmol/L (ref 3.5–5.1)
Sodium: 136 mmol/L (ref 135–145)

## 2014-11-30 LAB — CBC WITH DIFFERENTIAL/PLATELET
BASOS ABS: 0.1 10*3/uL (ref 0.0–0.1)
BASOS PCT: 1 %
Eosinophils Absolute: 0.4 10*3/uL (ref 0.0–0.7)
Eosinophils Relative: 4 %
HEMATOCRIT: 48.2 % (ref 39.0–52.0)
HEMOGLOBIN: 16.5 g/dL (ref 13.0–17.0)
LYMPHS PCT: 27 %
Lymphs Abs: 2.6 10*3/uL (ref 0.7–4.0)
MCH: 33.1 pg (ref 26.0–34.0)
MCHC: 34.2 g/dL (ref 30.0–36.0)
MCV: 96.6 fL (ref 78.0–100.0)
MONO ABS: 0.9 10*3/uL (ref 0.1–1.0)
MONOS PCT: 9 %
NEUTROS ABS: 5.7 10*3/uL (ref 1.7–7.7)
NEUTROS PCT: 59 %
Platelets: 196 10*3/uL (ref 150–400)
RBC: 4.99 MIL/uL (ref 4.22–5.81)
RDW: 14.4 % (ref 11.5–15.5)
WBC: 9.7 10*3/uL (ref 4.0–10.5)

## 2014-11-30 LAB — APTT: aPTT: 27 seconds (ref 24–37)

## 2014-11-30 LAB — PROTIME-INR
INR: 1 (ref 0.00–1.49)
Prothrombin Time: 13.4 seconds (ref 11.6–15.2)

## 2014-11-30 IMAGING — DX DG CHEST 2V
2 series · 2 of 2 positions shown · non-contrast
Comparison: CT scan of the chest [DATE]

CLINICAL DATA: Chronic left chest pain which has worsened over the
past 10 days and is associated with shortness of breath,
discontinued heavy tobacco use in [8S]

EXAM:
CHEST  2 VIEW

[chest pa]
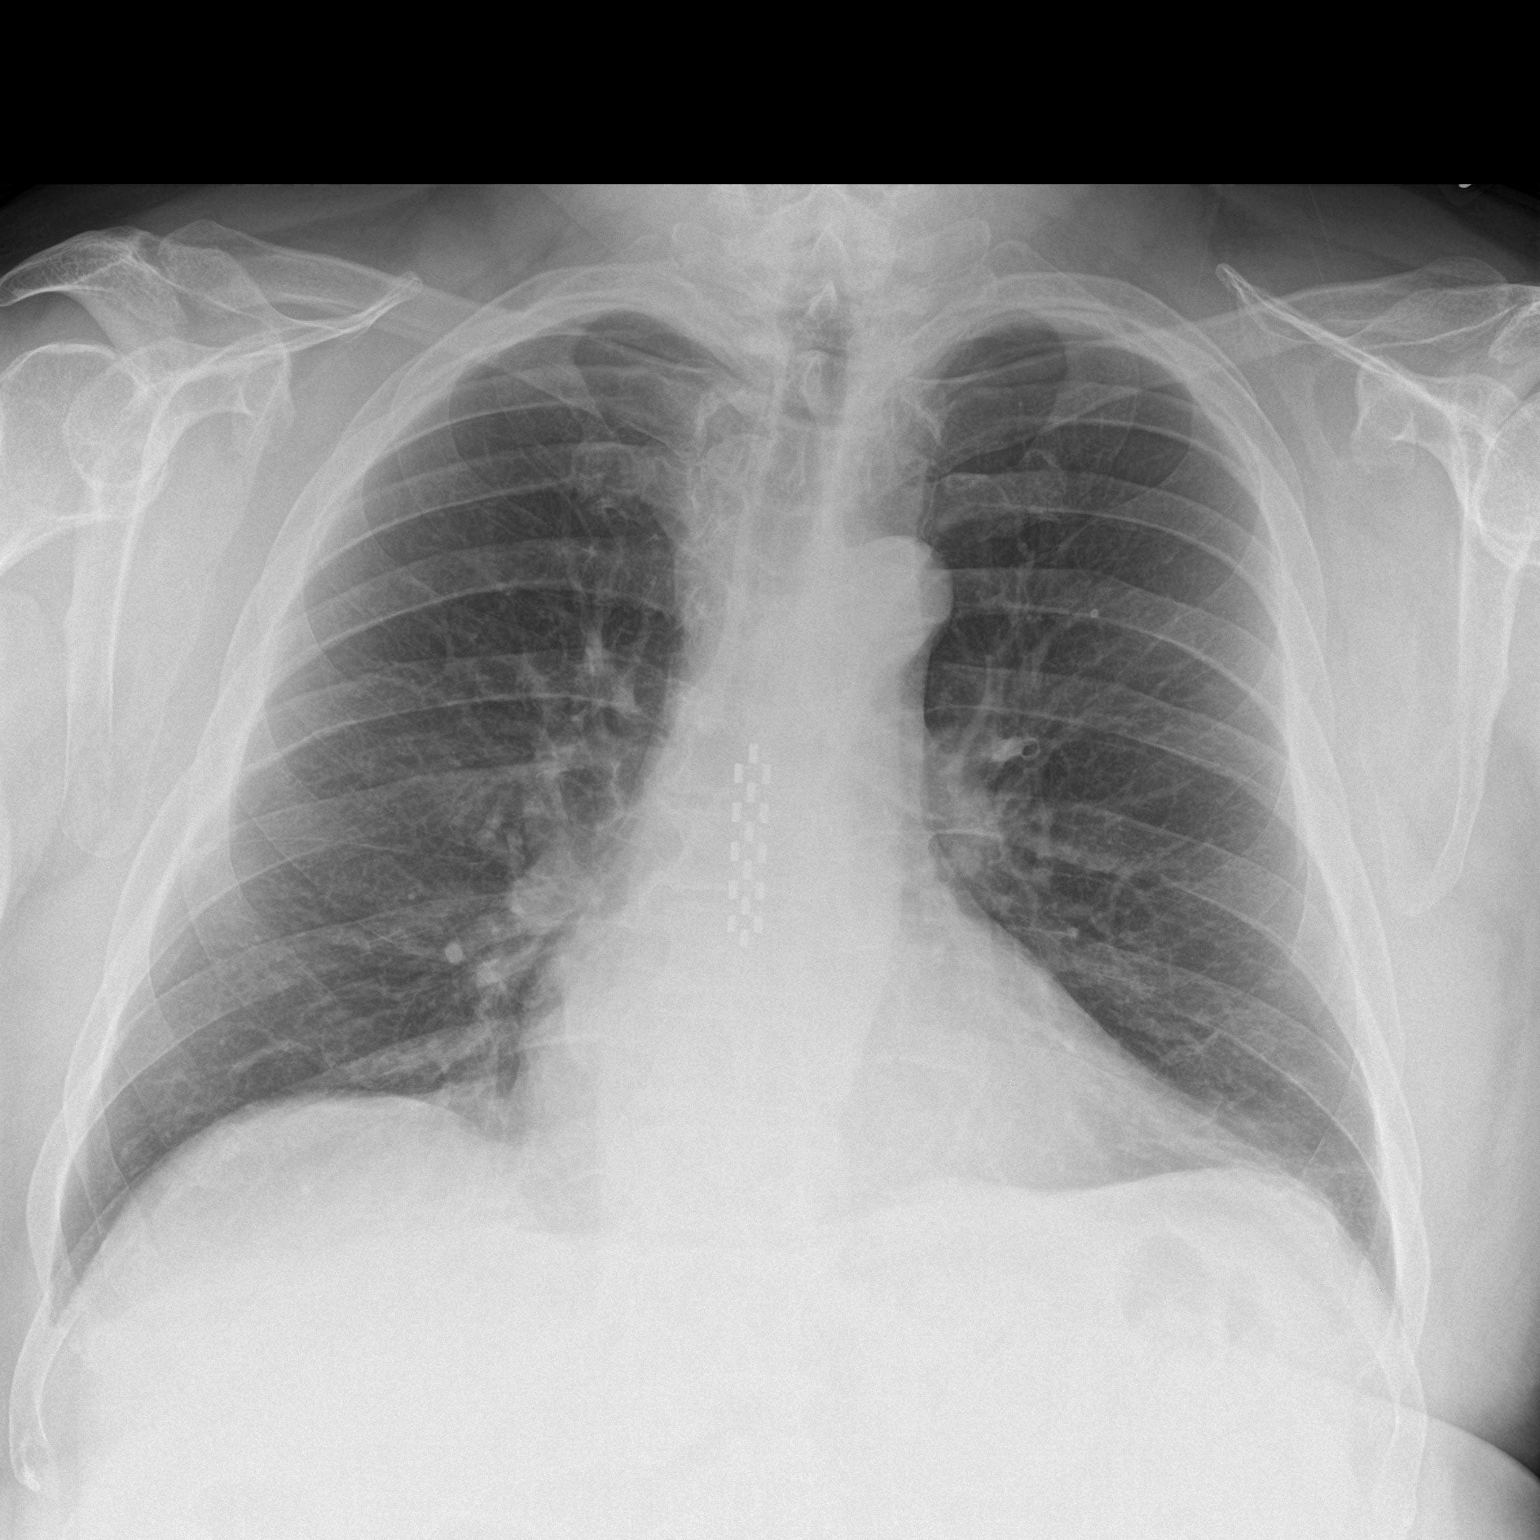

[chest lat]
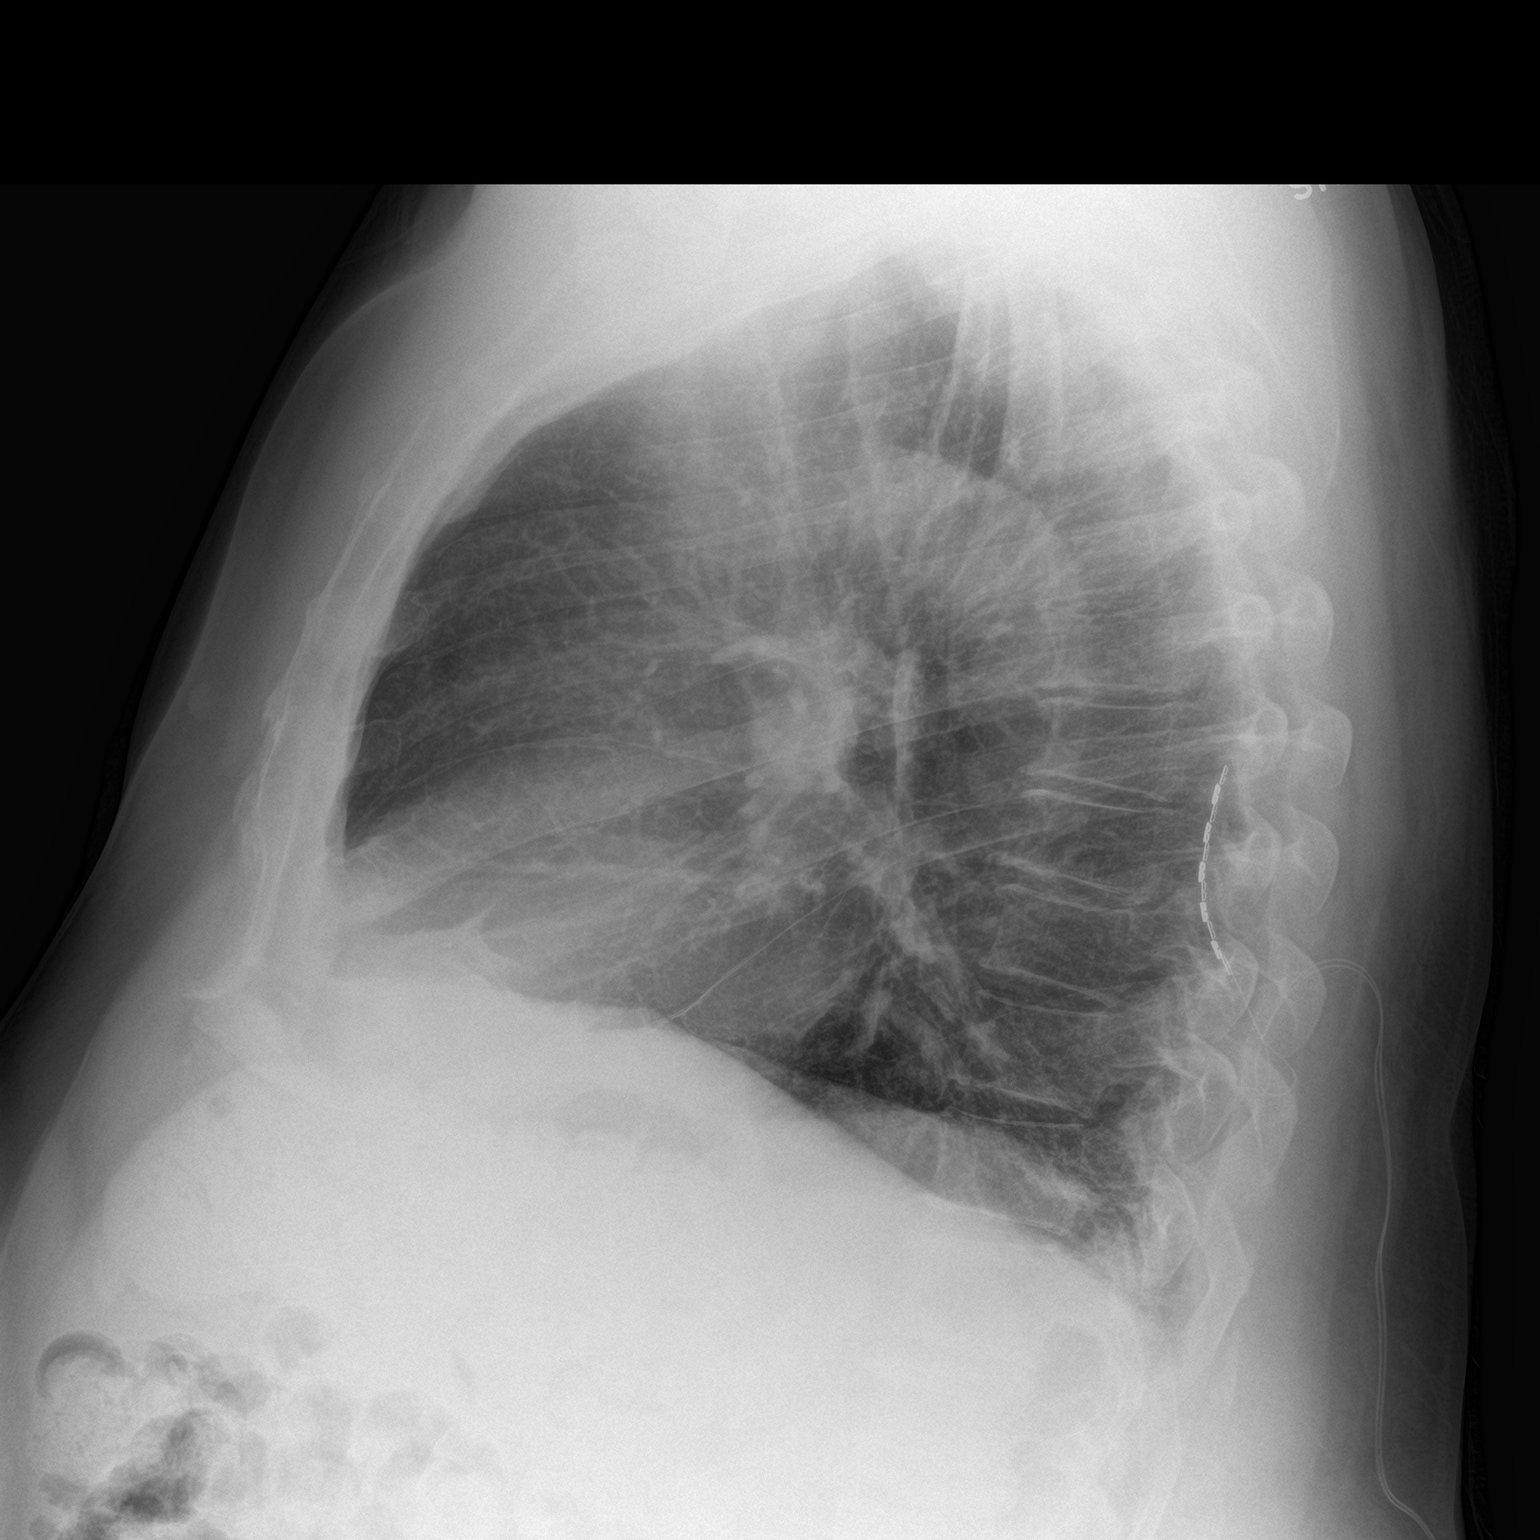

[2 of 2 positions shown; findings below may reference images not displayed]

FINDINGS: The lungs are adequately inflated. There is no focal infiltrate.
There is no pleural effusion. The interstitial markings are mildly
prominent diffusely. The heart and pulmonary vascularity are normal.
The mediastinum is normal in width. The bony thorax exhibits no
acute abnormality. A nerve stimulator electrode projects over the
mid thoracic spinal canal.
IMPRESSION: There is no active cardiopulmonary disease.

## 2014-11-30 MED ORDER — NITROGLYCERIN 0.4 MG SL SUBL: 0.4000 mg | SUBLINGUAL_TABLET | SUBLINGUAL | Status: DC | PRN

## 2014-11-30 NOTE — Progress Notes (Signed)
Name: Antonio Lucas    DOB: 16-Aug-1955  Age: 59 y.o.  MR#: [id number]       PCP:  Josue Hector, MD      Insurance: Payor: BLUE CROSS BLUE [employer] / Plan: BCBS/[employer] PPO / Product Type: *No Product type* /   CC:    Chief Complaint  Patient presents with  . Chest Pain    VS Filed Vitals:   11/30/14 1418  BP: 122/72  Pulse: 66  Height:  (1.702 m)  Weight: 279 lb 9.6 oz (126.826 kg)  SpO2: 94%    Weights Current Weight  11/30/14 279 lb 9.6 oz (126.826 kg)  11/03/14 291 lb (131.997 kg)  08/21/14 280 lb (127.007 kg)    Blood Pressure  BP Readings from Last 3 Encounters:  11/30/14 122/72  11/03/14 122/81  08/21/14 101/65     Admit date:  (Not on file) Last encounter with RMR:  Visit date not found   Allergy Imdur and Fentanyl  Current Outpatient Prescriptions  Medication Sig Dispense Refill  . aspirin EC 81 MG tablet Take 81 mg by mouth daily.    Marland Kitchen atorvastatin (LIPITOR) 40 MG tablet TAKE 1 TABLET EVERY DAY 15 tablet 0  . furosemide (LASIX) 20 MG tablet Take 40 mg by mouth daily.     . nebivolol (BYSTOLIC) 10 MG tablet Take 10 mg by mouth daily.    Marland Kitchen omeprazole (PRILOSEC) 40 MG capsule Take 40 mg by mouth 2 (two) times daily.    . ranolazine (RANEXA) 500 MG 12 hr tablet Take 500 mg by mouth 2 (two) times daily.    . tadalafil (CIALIS) 5 MG tablet Take 5 mg by mouth daily.    Marland Kitchen testosterone cypionate (DEPOTESTOTERONE CYPIONATE) 200 MG/ML injection Inject 0.5 mLs into the muscle every 14 (fourteen) days.     . valsartan (DIOVAN) 80 MG tablet Take 1 tablet (80 mg total) by mouth daily. 30 tablet 6  . Vitamin D, Ergocalciferol, (DRISDOL) 50000 UNITS CAPS Take 1 capsule by mouth Once a week. sunday    . zolmitriptan (ZOMIG) 5 MG tablet Take 1 tablet by mouth every 2 (two) hours as needed for migraine.      No current facility-administered medications for this visit.    Discontinued Meds:    Medications Discontinued During This Encounter  Medication  Reason  . furosemide (LASIX) 40 MG tablet Error    Patient Active Problem List   Diagnosis Date Noted  . Morbid obesity 12/27/2011  . Edema 12/27/2011  . GERD (gastroesophageal reflux disease) 12/23/2011  . GI bleed 12/23/2011  . Hypertension 12/23/2011  . High cholesterol 12/23/2011    LABS    Component Value Date/Time   NA 135 11/14/2012 0907   NA 133* 11/13/2012 0428   K 3.8 11/14/2012 0907   K 3.7 11/13/2012 0428   CL 101 11/14/2012 0907   CL 98 11/13/2012 0428   CO2 30 11/14/2012 0907   CO2 27 11/13/2012 0428   GLUCOSE 128* 11/14/2012 0907   GLUCOSE 166* 11/13/2012 0428   BUN 15 11/14/2012 0907   BUN 14 11/13/2012 0428   CREATININE 0.97 11/14/2012 0907   CREATININE 0.90 11/13/2012 0428   CALCIUM 8.9 11/14/2012 0907   CALCIUM 9.2 11/13/2012 0428   GFRNONAA 89* 11/14/2012 0907   GFRNONAA >90 11/13/2012 0428   GFRAA >90 11/14/2012 0907   GFRAA >90 11/13/2012 0428   CMP     Component Value Date/Time   NA 135 11/14/2012 4098  K 3.8 11/14/2012 0907   CL 101 11/14/2012 0907   CO2 30 11/14/2012 0907   GLUCOSE 128* 11/14/2012 0907   BUN 15 11/14/2012 0907   CREATININE 0.97 11/14/2012 0907   CALCIUM 8.9 11/14/2012 0907   PROT 5.9* 11/14/2012 0907   ALBUMIN 2.9* 11/14/2012 0907   AST 15 11/14/2012 0907   ALT 12 11/14/2012 0907   ALKPHOS 80 11/14/2012 0907   BILITOT 0.4 11/14/2012 0907   GFRNONAA 89* 11/14/2012 0907   GFRAA >90 11/14/2012 0907       Component Value Date/Time   WBC 8.4 11/29/2013 1254   WBC 7.3 11/14/2012 0907   WBC 7.9 11/13/2012 0428   HGB 14.0 11/29/2013 1254   HGB 11.8* 11/14/2012 0907   HGB 12.3* 11/13/2012 0428   HCT 42.3 11/29/2013 1254   HCT 35.6* 11/14/2012 0907   HCT 36.6* 11/13/2012 0428   MCV 93.0 11/29/2013 1254   MCV 93.2 11/14/2012 0907   MCV 93.4 11/13/2012 0428    Lipid Panel     Component Value Date/Time   CHOL 212* 03/01/2012 0000   TRIG 141 03/01/2012 0000   HDL 65 03/01/2012 0000   CHOLHDL 3.3 03/01/2012  0000   VLDL 28 03/01/2012 0000   LDLCALC 119* 03/01/2012 0000    ABG No results found for: PHART, PCO2ART, PO2ART, HCO3, TCO2, ACIDBASEDEF, O2SAT   No results found for: TSH BNP (last 3 results) No results for input(s): BNP in the last 8760 hours.  ProBNP (last 3 results) No results for input(s): PROBNP in the last 8760 hours.  Cardiac Panel (last 3 results) No results for input(s): CKTOTAL, CKMB, TROPONINI, RELINDX in the last 72 hours.  Iron/TIBC/Ferritin/ %Sat    Component Value Date/Time   IRON 83 12/23/2011 1045   TIBC 332 12/23/2011 1045   FERRITIN 92 12/23/2011 1045   IRONPCTSAT 25 12/23/2011 1045     EKG Orders placed or performed in visit on 12/26/13  . EKG 12-Lead     Prior Assessment and Plan Problem List as of 11/30/2014      Cardiovascular and Mediastinum   Hypertension     Digestive   GERD (gastroesophageal reflux disease)   GI bleed     Other   High cholesterol   Morbid obesity   Edema       Imaging: No results found.

## 2014-11-30 NOTE — Patient Instructions (Addendum)
Your physician has requested that you have a cardiac catheterization. Cardiac catheterization is used to diagnose and/or treat various heart conditions. Doctors may recommend this procedure for a number of different reasons. The most common reason is to evaluate chest pain. Chest pain can be a symptom of coronary artery disease (CAD), and cardiac catheterization can show whether plaque is narrowing or blocking your heart's arteries. This procedure is also used to evaluate the valves, as well as measure the blood flow and oxygen levels in different parts of your heart. For further information please visit [email or web address] Please follow instruction sheet, as given.   Your physician recommends that you continue on your current medications as directed. Please refer to the Current Medication list given to you today.  Start Nitro 0.4 mg for Chest pain.   Please have chest X-Ray done today.  Thank you for choosing [facility]!

## 2014-11-30 NOTE — Telephone Encounter (Signed)
Call placed to patient to discuss symptoms.  OV scheduled for today with Joni Reining, NP in our [place] office.  Per Dr. Purvis Sheffield last office note, if symptoms persist may need heart cath.

## 2014-11-30 NOTE — Progress Notes (Signed)
Cardiology Office Note   Date:  11/30/2014   ID:  Antonio Lucas, DOB 06-09-1955, MRN [id number]  PCP:  Josue Hector, MD  Cardiologist:  Inis Sizer, NP   Chief Complaint  Patient presents with  . Chest Pain      History of Present Illness: Antonio Lucas is a 59 y.o. male who presents for chest pain in the context of a strong family history of premature coronary artery disease, with a prior history of tobacco abuse. He underwent a normal nuclear stress test on 01/02/14. Echocardiogram demonstrated normal left ventricular systolic function, EF 60-65%. I previously prescribed Imdur but he did not tolerate it.  Ranexa was started with significant improvement in symptoms.   With recurrent chest pain, and worsening fatigue and dyspnea on exertion.  He states he cannot walk greater than 50-75 feet without being short of breath and diaphoretic.  He also has been having chest pressure, which has been coming and going with and without exertion.  Sometimes, however, the chest pressures there "all day" with intermittent stabbing, sharp chest pain.  His wife has been noticing that his energy level has been decreasing.  As a result of these worsening symptoms, is requesting this office visit.  Past Medical History  Diagnosis Date  . Pneumonia 1995  . GERD (gastroesophageal reflux disease)   . Arthritis   . Hyperlipemia   . Hypertension     Past Surgical History  Procedure Laterality Date  . Back surgery      02.2011- HE HAD SOME NECK PROBLEMS WITH C7 .  . Carpal tunnel release      both arms  . Tonsillectomy    . Esophagogastroduodenoscopy (egd) with esophageal dilation  01/07/2012    Procedure: ESOPHAGOGASTRODUODENOSCOPY (EGD) WITH ESOPHAGEAL DILATION;  Surgeon: Malissa Hippo, MD;  [facility];  Service: Endoscopy;  Laterality: N/A;  145  . Colonoscopy  02/16/2012    Procedure: COLONOSCOPY;  Surgeon: Malissa Hippo, MD;  [facility];   Service: Endoscopy;  Laterality: N/A;  730  . Esophagogastroduodenoscopy N/A 12/14/2013    Procedure: ESOPHAGOGASTRODUODENOSCOPY (EGD);  Surgeon: Malissa Hippo, MD;  [facility];  Service: Endoscopy;  Laterality: N/A;  1030  . Maloney dilation N/A 12/14/2013    Procedure: Elease Hashimoto DILATION;  Surgeon: Malissa Hippo, MD;  [facility];  Service: Endoscopy;  Laterality: N/A;     Current Outpatient Prescriptions  Medication Sig Dispense Refill  . aspirin EC 81 MG tablet Take 81 mg by mouth daily.    Marland Kitchen atorvastatin (LIPITOR) 40 MG tablet TAKE 1 TABLET EVERY DAY 15 tablet 0  . furosemide (LASIX) 20 MG tablet Take 40 mg by mouth daily.     . nebivolol (BYSTOLIC) 10 MG tablet Take 10 mg by mouth daily.    Marland Kitchen omeprazole (PRILOSEC) 40 MG capsule Take 40 mg by mouth 2 (two) times daily.    . ranolazine (RANEXA) 500 MG 12 hr tablet Take 500 mg by mouth 2 (two) times daily.    . tadalafil (CIALIS) 5 MG tablet Take 5 mg by mouth daily.    Marland Kitchen testosterone cypionate (DEPOTESTOTERONE CYPIONATE) 200 MG/ML injection Inject 0.5 mLs into the muscle every 14 (fourteen) days.     . valsartan (DIOVAN) 80 MG tablet Take 1 tablet (80 mg total) by mouth daily. 30 tablet 6  . Vitamin D, Ergocalciferol, (DRISDOL) 50000 UNITS CAPS Take 1 capsule by mouth Once a week. sunday    .  zolmitriptan (ZOMIG) 5 MG tablet Take 1 tablet by mouth every 2 (two) hours as needed for migraine.     . nitroGLYCERIN (NITROSTAT) 0.4 MG SL tablet Place 1 tablet (0.4 mg total) under the tongue every 5 (five) minutes as needed for chest pain. 25 tablet 3   No current facility-administered medications for this visit.    Allergies:   Imdur and Fentanyl    Social History:  The patient  reports that he quit smoking about 7 years ago. His smoking use included Cigarettes. He started smoking about 52 years ago. He has a 80 pack-year smoking history. He has never used smokeless tobacco. He reports that he drinks alcohol. He  reports that he does not use illicit drugs.   Family History:  The patient's family history includes Heart attack in his father.father died in his 79s after having multiple MI. Other family members, cousins, and uncles all with heart disease.   ROS: All other systems are reviewed and negative. Unless otherwise mentioned in H&P    PHYSICAL EXAM: VS:  BP 122/72 mmHg  Pulse 66  Ht  (1.702 m)  Wt 279 lb 9.6 oz (126.826 kg)  BMI 43.78 kg/m2  SpO2 94% , BMI Body mass index is 43.78 kg/(m^2). GEN: Well nourished, well developed, in no acute distress HEENT: normal Neck: no JVD, carotid bruits, or masses Cardiac: RRR; no murmurs, rubs, or gallops,no edema  Respiratory:  clear to auscultation bilaterally, normal work of breathing GI: soft, nontender, nondistended, + BS, obese. MS: no deformity or atrophy Skin: warm and dry, no rash Neuro:  Strength and sensation are intact Psych: euthymic mood, full affect   EKG:  The ekg ordered today demonstrates NSR rate of 66 beats per minute.   Recent Labs: No results found for requested labs within last 365 days.    Lipid Panel    Component Value Date/Time   CHOL 212* 03/01/2012 0000   TRIG 141 03/01/2012 0000   HDL 65 03/01/2012 0000   CHOLHDL 3.3 03/01/2012 0000   VLDL 28 03/01/2012 0000   LDLCALC 119* 03/01/2012 0000      Wt Readings from Last 3 Encounters:  11/30/14 279 lb 9.6 oz (126.826 kg)  11/03/14 291 lb (131.997 kg)  08/21/14 280 lb (127.007 kg)      Other studies Reviewed: Additional studies/ records that were reviewed today include: None Review of the above records demonstrates: None   ASSESSMENT AND PLAN:  1. Unstable angina: the patient has worsening chest pressure, sometimes he states it lasts for several hours at other times it comes and goes, he also has transient sharp chest pain, which begins on the left side of his sternum and radiates through to his posterior left upper arm.  He has worsening fatigue  and dyspnea on exertion. It is noted that he had a stress test that was normal.  One year ago.  The symptoms have recurred despite use of nitrates Ranexa.   I would like the patient to proceed with a cardiac catheterization for definitive evaluation of coronary anatomy with recurrent chest pain concerning for unstable angina.  I discussed this with Dr. Purvis Sheffield, who is on site.  He is in agreement with my assessment and plan.  The patient will be scheduled with Dr. Tresa Endo on 12/04/2014.  I have spent 25 minutes with this patient and his wife explaining cardiac catheterization, procedure, risks and benefits, reviewing his medications and symptoms.  I have also referred him to the website, "[email or web address]"  as this will also be a resource cannot answer his multiple questions.  He will followup with our office.  Post procedure for ongoing management.  In the interim, despite use of nitrates, and Ranexa, I have given him a prescription for nitroglycerin sublingual, which she is to be taking while sitting down for severe chest pain.  He is aware, that if he has worsening chest pain , frequency, or intensity, he is to come to the emergency room.  2. Hypertension:currently blood pressure is well-controlled.  No changes in his medication regimen.  3. Obesity: weight loss, and increase activity is recommended.  The patient has chronic back pain, especially in the thoracic vertebral area.  He would like to increase his activity and lose weight, but often has recurrence of discomfort while doing so.  I cannot rule out musculoskeletal etiology for his recurrent chest discomfort.  Once catheterization is completed, will have a more definitive idea of how to proceed with his exercise regimen.   Current medicines are reviewed at length with the patient today.    Labs/ tests ordered today include: precardiac catheterization labs.  Orders Placed This Encounter  Procedures  . DG Chest 2 View  . INR/PT  . PTT  .  CBC with Differential  . Basic Metabolic Panel (BMET)  . EKG 12-Lead     Disposition:   FU with post cardiac catheterization.  Signed, Joni Reining, NP  11/30/2014 4:09 PM    [facility] [address] [place], [place] [address] Phone: [phone]; Fax: [phone]

## 2014-12-01 ENCOUNTER — Telehealth: Payer: Self-pay | Admitting: *Deleted

## 2014-12-01 DIAGNOSIS — E860 Dehydration: Secondary | ICD-10-CM

## 2014-12-01 NOTE — Telephone Encounter (Signed)
-----   Message from Nori Riis, RN sent at 12/01/2014  7:21 AM EDT -----   ----- Message -----    From: Jodelle Gross, NP    Sent: 12/01/2014   7:07 AM      To: Nori Riis, RN  Labs reveal some evidence of dehydration. Hold lasix until cath on Monday. He will need a repeat BMET Monday morning when he goes to have cath, prior to procedure to make sure creatinine is improved in order to accept the contrast.

## 2014-12-04 ENCOUNTER — Encounter (HOSPITAL_COMMUNITY)
Admission: RE | Disposition: A | Discharge status: Home / Self Care | Payer: Self-pay | Source: Ambulatory Visit | Attending: Cardiovascular Disease

## 2014-12-04 ENCOUNTER — Ambulatory Visit (HOSPITAL_COMMUNITY)
Admission: RE | Admit: 2014-12-04 | Discharge: 2014-12-04 | Disposition: A | Discharge status: Home / Self Care | Payer: Federal, State, Local not specified - PPO | Source: Ambulatory Visit | Attending: Cardiovascular Disease | Admitting: Cardiovascular Disease

## 2014-12-04 DIAGNOSIS — I200 Unstable angina: Secondary | ICD-10-CM | POA: Insufficient documentation

## 2014-12-04 DIAGNOSIS — K219 Gastro-esophageal reflux disease without esophagitis: Secondary | ICD-10-CM | POA: Diagnosis not present

## 2014-12-04 DIAGNOSIS — E785 Hyperlipidemia, unspecified: Secondary | ICD-10-CM | POA: Diagnosis not present

## 2014-12-04 DIAGNOSIS — Z87891 Personal history of nicotine dependence: Secondary | ICD-10-CM | POA: Insufficient documentation

## 2014-12-04 DIAGNOSIS — I25110 Atherosclerotic heart disease of native coronary artery with unstable angina pectoris: Secondary | ICD-10-CM | POA: Diagnosis not present

## 2014-12-04 DIAGNOSIS — Z7982 Long term (current) use of aspirin: Secondary | ICD-10-CM | POA: Insufficient documentation

## 2014-12-04 DIAGNOSIS — R079 Chest pain, unspecified: Secondary | ICD-10-CM | POA: Diagnosis present

## 2014-12-04 DIAGNOSIS — Z8249 Family history of ischemic heart disease and other diseases of the circulatory system: Secondary | ICD-10-CM | POA: Insufficient documentation

## 2014-12-04 DIAGNOSIS — M1990 Unspecified osteoarthritis, unspecified site: Secondary | ICD-10-CM | POA: Diagnosis not present

## 2014-12-04 DIAGNOSIS — Z6841 Body Mass Index (BMI) 40.0 and over, adult: Secondary | ICD-10-CM | POA: Insufficient documentation

## 2014-12-04 DIAGNOSIS — Z136 Encounter for screening for cardiovascular disorders: Secondary | ICD-10-CM

## 2014-12-04 DIAGNOSIS — Z01818 Encounter for other preprocedural examination: Secondary | ICD-10-CM

## 2014-12-04 DIAGNOSIS — I10 Essential (primary) hypertension: Secondary | ICD-10-CM | POA: Diagnosis not present

## 2014-12-04 DIAGNOSIS — Z01811 Encounter for preprocedural respiratory examination: Secondary | ICD-10-CM

## 2014-12-04 HISTORY — PX: CARDIAC CATHETERIZATION: SHX172

## 2014-12-04 LAB — BASIC METABOLIC PANEL
ANION GAP: 7 (ref 5–15)
BUN: 18 mg/dL (ref 6–20)
CO2: 25 mmol/L (ref 22–32)
CREATININE: 0.95 mg/dL (ref 0.61–1.24)
Calcium: 8.8 mg/dL — ABNORMAL LOW (ref 8.9–10.3)
Chloride: 101 mmol/L (ref 101–111)
GFR calc non Af Amer: >60 mL/min (ref >60)
Glucose, Bld: 111 mg/dL — ABNORMAL HIGH (ref 65–99)
Potassium: 4 mmol/L (ref 3.5–5.1)
SODIUM: 133 mmol/L — AB (ref 135–145)

## 2014-12-04 LAB — POCT ACTIVATED CLOTTING TIME: Activated Clotting Time: 177 seconds

## 2014-12-04 SURGERY — LEFT HEART CATH AND CORONARY ANGIOGRAPHY
Anesthesia: LOCAL

## 2014-12-04 MED ORDER — DIAZEPAM 5 MG PO TABS
5.0000 mg | ORAL_TABLET | Freq: Once | ORAL | Status: AC
  Administered 2014-12-04: 5 mg via ORAL

## 2014-12-04 MED ORDER — DIAZEPAM 5 MG PO TABS: 5.0000 mg | ORAL_TABLET | ORAL | Status: DC | PRN

## 2014-12-04 MED ORDER — MIDAZOLAM HCL 2 MG/2ML IJ SOLN
INTRAMUSCULAR | Status: AC
  Filled 2014-12-04: qty 4

## 2014-12-04 MED ORDER — MIDAZOLAM HCL 2 MG/2ML IJ SOLN
INTRAMUSCULAR | Status: DC | PRN
  Administered 2014-12-04: 2 mg via INTRAVENOUS
  Administered 2014-12-04: 1 mg via INTRAVENOUS

## 2014-12-04 MED ORDER — DIAZEPAM 5 MG PO TABS
ORAL_TABLET | ORAL | Status: AC
  Filled 2014-12-04: qty 1

## 2014-12-04 MED ORDER — HEPARIN (PORCINE) IN NACL 2-0.9 UNIT/ML-% IJ SOLN
INTRAMUSCULAR | Status: AC
  Filled 2014-12-04: qty 1000

## 2014-12-04 MED ORDER — NITROGLYCERIN 1 MG/10 ML FOR IR/CATH LAB
INTRA_ARTERIAL | Status: AC
  Filled 2014-12-04: qty 10

## 2014-12-04 MED ORDER — LIDOCAINE HCL (PF) 1 % IJ SOLN
INTRAMUSCULAR | Status: DC | PRN
  Administered 2014-12-04: 09:00:00

## 2014-12-04 MED ORDER — HEPARIN SODIUM (PORCINE) 1000 UNIT/ML IJ SOLN
INTRAMUSCULAR | Status: DC | PRN
  Administered 2014-12-04: 6000 [IU] via INTRAVENOUS

## 2014-12-04 MED ORDER — SODIUM CHLORIDE 0.9 % IJ SOLN
3.0000 mL | Freq: Two times a day (BID) | INTRAMUSCULAR | Status: DC
Start: 2014-12-04 — End: 2014-12-04

## 2014-12-04 MED ORDER — SODIUM CHLORIDE 0.9 % WEIGHT BASED INFUSION
3.0000 mL/kg/h | INTRAVENOUS | Status: AC
  Administered 2014-12-04: 3 mL/kg/h via INTRAVENOUS

## 2014-12-04 MED ORDER — OXYCODONE-ACETAMINOPHEN 5-325 MG PO TABS
ORAL_TABLET | ORAL | Status: AC
  Administered 2014-12-04: 2 via ORAL
  Filled 2014-12-04: qty 2

## 2014-12-04 MED ORDER — LIDOCAINE HCL (PF) 1 % IJ SOLN
INTRAMUSCULAR | Status: AC
  Filled 2014-12-04: qty 30

## 2014-12-04 MED ORDER — SODIUM CHLORIDE 0.9 % IV SOLN: 250.0000 mL | INTRAVENOUS | Status: DC | PRN

## 2014-12-04 MED ORDER — ONDANSETRON HCL 4 MG/2ML IJ SOLN: 4.0000 mg | Freq: Four times a day (QID) | INTRAMUSCULAR | Status: DC | PRN

## 2014-12-04 MED ORDER — OXYCODONE-ACETAMINOPHEN 5-325 MG PO TABS
2.0000 | ORAL_TABLET | Freq: Once | ORAL | Status: AC
Start: 2014-12-04 — End: 2014-12-04
  Administered 2014-12-04: 2 via ORAL
  Filled 2014-12-04: qty 2

## 2014-12-04 MED ORDER — ASPIRIN 81 MG PO CHEW
81.0000 mg | CHEWABLE_TABLET | ORAL | Status: AC
  Administered 2014-12-04: 81 mg via ORAL

## 2014-12-04 MED ORDER — SODIUM CHLORIDE 0.9 % IJ SOLN: 3.0000 mL | Freq: Two times a day (BID) | INTRAMUSCULAR | Status: DC

## 2014-12-04 MED ORDER — ASPIRIN 81 MG PO CHEW
CHEWABLE_TABLET | ORAL | Status: AC
  Filled 2014-12-04: qty 1

## 2014-12-04 MED ORDER — SODIUM CHLORIDE 0.9 % WEIGHT BASED INFUSION: 1.0000 mL/kg/h | INTRAVENOUS | Status: DC

## 2014-12-04 MED ORDER — OXYCODONE-ACETAMINOPHEN 5-325 MG PO TABS: 2.0000 | ORAL_TABLET | Freq: Once | ORAL | Status: DC

## 2014-12-04 MED ORDER — VERAPAMIL HCL 2.5 MG/ML IV SOLN
INTRA_ARTERIAL | Status: DC | PRN
  Administered 2014-12-04: 08:00:00 via INTRA_ARTERIAL

## 2014-12-04 MED ORDER — ASPIRIN 81 MG PO CHEW: 81.0000 mg | CHEWABLE_TABLET | Freq: Every day | ORAL | Status: DC

## 2014-12-04 MED ORDER — SODIUM CHLORIDE 0.9 % IV SOLN: INTRAVENOUS | Status: AC

## 2014-12-04 MED ORDER — ACETAMINOPHEN 325 MG PO TABS: 650.0000 mg | ORAL_TABLET | ORAL | Status: DC | PRN

## 2014-12-04 MED ORDER — SODIUM CHLORIDE 0.9 % IJ SOLN: 3.0000 mL | INTRAMUSCULAR | Status: DC | PRN

## 2014-12-04 MED ORDER — VERAPAMIL HCL 2.5 MG/ML IV SOLN
INTRAVENOUS | Status: AC
  Filled 2014-12-04: qty 2

## 2014-12-04 SURGICAL SUPPLY — 17 items
CATH INFINITI 5FR ANG PIGTAIL (CATHETERS) ×3 IMPLANT
CATH INFINITI 5FR JL4 (CATHETERS) ×3 IMPLANT
CATH INFINITI 5FR JL5 (CATHETERS) ×3 IMPLANT
CATH INFINITI 5FR MULTPACK ANG (CATHETERS) IMPLANT
CATH INFINITI JR4 5F (CATHETERS) ×3 IMPLANT
CATH OPTITORQUE TIG 4.0 5F (CATHETERS) ×3 IMPLANT
CATH SITESEER 5F NTR (CATHETERS) ×3 IMPLANT
DEVICE RAD COMP TR BAND LRG (VASCULAR PRODUCTS) ×3 IMPLANT
GLIDESHEATH SLEND A-KIT 6F 22G (SHEATH) ×3 IMPLANT
KIT HEART LEFT (KITS) ×3 IMPLANT
PACK CARDIAC CATHETERIZATION (CUSTOM PROCEDURE TRAY) ×3 IMPLANT
SHEATH PINNACLE 5F 10CM (SHEATH) ×3 IMPLANT
SYR MEDRAD MARK V 150ML (SYRINGE) ×3 IMPLANT
TRANSDUCER W/STOPCOCK (MISCELLANEOUS) ×3 IMPLANT
TUBING CIL FLEX 10 FLL-RA (TUBING) ×3 IMPLANT
WIRE EMERALD 3MM-J .035X150CM (WIRE) ×3 IMPLANT
WIRE SAFE-T 1.5MM-J .035X260CM (WIRE) ×3 IMPLANT

## 2014-12-04 NOTE — Interval H&P Note (Signed)
Cath Lab Visit (complete for each Cath Lab visit)  Clinical Evaluation Leading to the Procedure:   ACS: No.  Non-ACS:    Anginal Classification: CCS III  Anti-ischemic medical therapy: Maximal Therapy (2 or more classes of medications)  Non-Invasive Test Results: No non-invasive testing performed  Prior CABG: No previous CABG      History and Physical Interval Note:  12/04/2014 7:52 AM  Antonio Lucas  has presented today for surgery, with the diagnosis of Chest Pain  The various methods of treatment have been discussed with the patient and family. After consideration of risks, benefits and other options for treatment, the patient has consented to  Procedure(s): Left Heart Cath and Coronary Angiography (N/A) as a surgical intervention .  The patient's history has been reviewed, patient examined, no change in status, stable for surgery.  I have reviewed the patient's chart and labs.  Questions were answered to the patient's satisfaction.     KELLY,THOMAS A

## 2014-12-04 NOTE — H&P (View-Only) (Signed)
Cardiology Office Note   Date:  11/30/2014   ID:  Antonio Lucas, DOB 01/05/1956, MRN [id number]  PCP:  NYLAND,LEONARD ROBERT, MD  Cardiologist:  Koneswaran/ Kathryn Lawrence, NP   Chief Complaint  Patient presents with  . Chest Pain      History of Present Illness: Antonio Lucas is a 59 y.o. male who presents for chest pain in the context of a strong family history of premature coronary artery disease, with a prior history of tobacco abuse. He underwent a normal nuclear stress test on 01/02/14. Echocardiogram demonstrated normal left ventricular systolic function, EF 60-65%. I previously prescribed Imdur but he did not tolerate it.  Ranexa was started with significant improvement in symptoms.   With recurrent chest pain, and worsening fatigue and dyspnea on exertion.  He states he cannot walk greater than 50-75 feet without being short of breath and diaphoretic.  He also has been having chest pressure, which has been coming and going with and without exertion.  Sometimes, however, the chest pressures there "all day" with intermittent stabbing, sharp chest pain.  His wife has been noticing that his energy level has been decreasing.  As a result of these worsening symptoms, is requesting this office visit.  Past Medical History  Diagnosis Date  . Pneumonia 1995  . GERD (gastroesophageal reflux disease)   . Arthritis   . Hyperlipemia   . Hypertension     Past Surgical History  Procedure Laterality Date  . Back surgery      02.2011- HE HAD SOME NECK PROBLEMS WITH C7 .  . Carpal tunnel release      both arms  . Tonsillectomy    . Esophagogastroduodenoscopy (egd) with esophageal dilation  01/07/2012    Procedure: ESOPHAGOGASTRODUODENOSCOPY (EGD) WITH ESOPHAGEAL DILATION;  Surgeon: Najeeb U Rehman, MD;  [facility];  Service: Endoscopy;  Laterality: N/A;  145  . Colonoscopy  02/16/2012    Procedure: COLONOSCOPY;  Surgeon: Najeeb U Rehman, MD;  [facility];   Service: Endoscopy;  Laterality: N/A;  730  . Esophagogastroduodenoscopy N/A 12/14/2013    Procedure: ESOPHAGOGASTRODUODENOSCOPY (EGD);  Surgeon: Najeeb U Rehman, MD;  [facility];  Service: Endoscopy;  Laterality: N/A;  1030  . Maloney dilation N/A 12/14/2013    Procedure: MALONEY DILATION;  Surgeon: Najeeb U Rehman, MD;  [facility];  Service: Endoscopy;  Laterality: N/A;     Current Outpatient Prescriptions  Medication Sig Dispense Refill  . aspirin EC 81 MG tablet Take 81 mg by mouth daily.    . atorvastatin (LIPITOR) 40 MG tablet TAKE 1 TABLET EVERY DAY 15 tablet 0  . furosemide (LASIX) 20 MG tablet Take 40 mg by mouth daily.     . nebivolol (BYSTOLIC) 10 MG tablet Take 10 mg by mouth daily.    . omeprazole (PRILOSEC) 40 MG capsule Take 40 mg by mouth 2 (two) times daily.    . ranolazine (RANEXA) 500 MG 12 hr tablet Take 500 mg by mouth 2 (two) times daily.    . tadalafil (CIALIS) 5 MG tablet Take 5 mg by mouth daily.    . testosterone cypionate (DEPOTESTOTERONE CYPIONATE) 200 MG/ML injection Inject 0.5 mLs into the muscle every 14 (fourteen) days.     . valsartan (DIOVAN) 80 MG tablet Take 1 tablet (80 mg total) by mouth daily. 30 tablet 6  . Vitamin D, Ergocalciferol, (DRISDOL) 50000 UNITS CAPS Take 1 capsule by mouth Once a week. sunday    .   zolmitriptan (ZOMIG) 5 MG tablet Take 1 tablet by mouth every 2 (two) hours as needed for migraine.     . nitroGLYCERIN (NITROSTAT) 0.4 MG SL tablet Place 1 tablet (0.4 mg total) under the tongue every 5 (five) minutes as needed for chest pain. 25 tablet 3   No current facility-administered medications for this visit.    Allergies:   Imdur and Fentanyl    Social History:  The patient  reports that he quit smoking about 7 years ago. His smoking use included Cigarettes. He started smoking about 52 years ago. He has a 80 pack-year smoking history. He has never used smokeless tobacco. He reports that he drinks alcohol. He  reports that he does not use illicit drugs.   Family History:  The patient's family history includes Heart attack in his father.father died in his 30s after having multiple MI. Other family members, cousins, and uncles all with heart disease.   ROS: All other systems are reviewed and negative. Unless otherwise mentioned in H&P    PHYSICAL EXAM: VS:  BP 122/72 mmHg  Pulse 66  Ht 5' 7" (1.702 m)  Wt 279 lb 9.6 oz (126.826 kg)  BMI 43.78 kg/m2  SpO2 94% , BMI Body mass index is 43.78 kg/(m^2). GEN: Well nourished, well developed, in no acute distress HEENT: normal Neck: no JVD, carotid bruits, or masses Cardiac: RRR; no murmurs, rubs, or gallops,no edema  Respiratory:  clear to auscultation bilaterally, normal work of breathing GI: soft, nontender, nondistended, + BS, obese. MS: no deformity or atrophy Skin: warm and dry, no rash Neuro:  Strength and sensation are intact Psych: euthymic mood, full affect   EKG:  The ekg ordered today demonstrates NSR rate of 66 beats per minute.   Recent Labs: No results found for requested labs within last 365 days.    Lipid Panel    Component Value Date/Time   CHOL 212* 03/01/2012 0000   TRIG 141 03/01/2012 0000   HDL 65 03/01/2012 0000   CHOLHDL 3.3 03/01/2012 0000   VLDL 28 03/01/2012 0000   LDLCALC 119* 03/01/2012 0000      Wt Readings from Last 3 Encounters:  11/30/14 279 lb 9.6 oz (126.826 kg)  11/03/14 291 lb (131.997 kg)  08/21/14 280 lb (127.007 kg)      Other studies Reviewed: Additional studies/ records that were reviewed today include: None Review of the above records demonstrates: None   ASSESSMENT AND PLAN:  1. Unstable angina: the patient has worsening chest pressure, sometimes he states it lasts for several hours at other times it comes and goes, he also has transient sharp chest pain, which begins on the left side of his sternum and radiates through to his posterior left upper arm.  He has worsening fatigue  and dyspnea on exertion. It is noted that he had a stress test that was normal.  One year ago.  The symptoms have recurred despite use of nitrates Ranexa.   I would like the patient to proceed with a cardiac catheterization for definitive evaluation of coronary anatomy with recurrent chest pain concerning for unstable angina.  I discussed this with Dr. Koneswaran, who is on site.  He is in agreement with my assessment and plan.  The patient will be scheduled with Dr. Kelly on 12/04/2014.  I have spent 25 minutes with this patient and his wife explaining cardiac catheterization, procedure, risks and benefits, reviewing his medications and symptoms.  I have also referred him to the website, "[email or web address].org"   as this will also be a resource cannot answer his multiple questions.  He will followup with our office.  Post procedure for ongoing management.  In the interim, despite use of nitrates, and Ranexa, I have given him a prescription for nitroglycerin sublingual, which she is to be taking while sitting down for severe chest pain.  He is aware, that if he has worsening chest pain , frequency, or intensity, he is to come to the emergency room.  2. Hypertension:currently blood pressure is well-controlled.  No changes in his medication regimen.  3. Obesity: weight loss, and increase activity is recommended.  The patient has chronic back pain, especially in the thoracic vertebral area.  He would like to increase his activity and lose weight, but often has recurrence of discomfort while doing so.  I cannot rule out musculoskeletal etiology for his recurrent chest discomfort.  Once catheterization is completed, will have a more definitive idea of how to proceed with his exercise regimen.   Current medicines are reviewed at length with the patient today.    Labs/ tests ordered today include: precardiac catheterization labs.  Orders Placed This Encounter  Procedures  . DG Chest 2 View  . INR/PT  . PTT  .  CBC with Differential  . Basic Metabolic Panel (BMET)  . EKG 12-Lead     Disposition:   FU with post cardiac catheterization.  Signed, Kathryn Lawrence, NP  11/30/2014 4:09 PM    [facility] [address], [place], [place] [address] Phone: [phone]; Fax: [phone]  

## 2014-12-04 NOTE — Discharge Instructions (Signed)
Angiogram, Care After Refer to this sheet in the next few weeks. These instructions provide you with information on caring for yourself after your procedure. Your health care provider may also give you more specific instructions. Your treatment has been planned according to current medical practices, but problems sometimes occur. Call your health care provider if you have any problems or questions after your procedure.  WHAT TO EXPECT AFTER THE PROCEDURE After your procedure, it is typical to have the following sensations:  Minor discomfort or tenderness and a small bump at the catheter insertion site. The bump should usually decrease in size and tenderness within 1 to 2 weeks.  Any bruising will usually fade within 2 to 4 weeks. HOME CARE INSTRUCTIONS   You may need to keep taking blood thinners if they were prescribed for you. Take medicines only as directed by your health care provider.  Do not apply powder or lotion to the site.  Do not take baths, swim, or use a hot tub until your health care provider approves.  You may shower 24 hours after the procedure. Remove the bandage (dressing) and gently wash the site with plain soap and water. Gently pat the site dry.  Inspect the site at least twice daily.  Limit your activity for the first 48 hours. Do not bend, squat, or lift anything over 20 lb (9 kg) or as directed by your health care provider.  Plan to have someone take you home after the procedure. Follow instructions about when you can drive or return to work. SEEK MEDICAL CARE IF:  You get light-headed when standing up.  You have drainage (other than a small amount of blood on the dressing).  You have chills.  You have a fever.  You have redness, warmth, swelling, or pain at the insertion site. SEEK IMMEDIATE MEDICAL CARE IF:   You develop chest pain or shortness of breath, feel faint, or pass out.  You have bleeding, swelling larger than a walnut, or drainage from the  catheter insertion site.  You develop pain, discoloration, coldness, or severe bruising in the leg or arm that held the catheter.  You develop bleeding from any other place, such as the bowels. You may see bright red blood in your urine or stools, or your stools may appear black and tarry.  You have heavy bleeding from the site. If this happens, hold pressure on the site. MAKE SURE YOU:  Understand these instructions.  Will watch your condition.  Will get help right away if you are not doing well or get worse. Document Released: 09/05/2004 Document Revised: 07/04/2013 Document Reviewed: 07/12/2012 [facility] Patient Information 2015 [place], [place]. This information is not intended to replace advice given to you by your health care provider. Make sure you discuss any questions you have with your health care provider.  Radial Radial Site Care Refer to this sheet in the next few weeks. These instructions provide you with information on caring for yourself after your procedure. Your caregiver may also give you more specific instructions. Your treatment has been planned according to current medical practices, but problems sometimes occur. Call your caregiver if you have any problems or questions after your procedure. HOME CARE INSTRUCTIONS  You may shower the day after the procedure.Remove the bandage (dressing) and gently wash the site with plain soap and water.Gently pat the site dry.  Do not apply powder or lotion to the site.  Do not submerge the affected site in water for 3 to 5 days.  Inspect the site at least twice daily.  Do not flex or bend the affected arm for 24 hours.  No lifting over 5 pounds (2.3 kg) for 5 days after your procedure.  Do not drive home if you are discharged the same day of the procedure. Have someone else drive you.  You may drive 24 hours after the procedure unless otherwise instructed by your caregiver.  Do not operate machinery or power tools for 24  hours.  A responsible adult should be with you for the first 24 hours after you arrive home. What to expect:  Any bruising will usually fade within 1 to 2 weeks.  Blood that collects in the tissue (hematoma) may be painful to the touch. It should usually decrease in size and tenderness within 1 to 2 weeks. SEEK IMMEDIATE MEDICAL CARE IF:  You have unusual pain at the radial site.  You have redness, warmth, swelling, or pain at the radial site.  You have drainage (other than a small amount of blood on the dressing).  You have chills.  You have a fever or persistent symptoms for more than 72 hours.  You have a fever and your symptoms suddenly get worse.  Your arm becomes pale, cool, tingly, or numb.  You have heavy bleeding from the site. Hold pressure on the site. Document Released: 03/22/2010 Document Revised: 05/12/2011 Document Reviewed: 03/22/2010 [facility] Patient Information 2015 [place], [place]. This information is not intended to replace advice given to you by your health care provider. Make sure you discuss any questions you have with your health care provider.

## 2014-12-05 ENCOUNTER — Encounter: Payer: Self-pay | Admitting: Adult Health

## 2014-12-05 ENCOUNTER — Encounter (HOSPITAL_COMMUNITY): Payer: Self-pay | Admitting: Cardiovascular Disease

## 2014-12-06 ENCOUNTER — Encounter: Payer: Self-pay | Admitting: Adult Health

## 2014-12-08 ENCOUNTER — Encounter: Payer: Self-pay | Admitting: Adult Health

## 2014-12-08 ENCOUNTER — Ambulatory Visit (INDEPENDENT_AMBULATORY_CARE_PROVIDER_SITE_OTHER): Payer: Federal, State, Local not specified - PPO | Admitting: Adult Health

## 2014-12-08 VITALS — BP 110/74 | HR 70 | Ht 67.0 in | Wt 287.0 lb

## 2014-12-08 DIAGNOSIS — I10 Essential (primary) hypertension: Secondary | ICD-10-CM

## 2014-12-08 DIAGNOSIS — R0789 Other chest pain: Secondary | ICD-10-CM

## 2014-12-08 NOTE — Progress Notes (Deleted)
Name: Antonio Lucas    DOB: 05/24/1955  Age: 59 y.o.  MR#: [id number]       PCP:  Josue Hector, MD      Insurance: Payor: BLUE CROSS BLUE SHIELD / Plan: BCBS/FEDERAL EMP PPO / Product Type: *No Product type* /   CC:   No chief complaint on file.   VS Filed Vitals:   12/08/14 1428  BP: 110/74  Pulse: 70  Height:  (1.702 m)  Weight: 287 lb (130.182 kg)  SpO2: 97%    Weights Current Weight  12/08/14 287 lb (130.182 kg)  12/04/14 280 lb (127.007 kg)  11/30/14 279 lb 9.6 oz (126.826 kg)    Blood Pressure  BP Readings from Last 3 Encounters:  12/08/14 110/74  12/04/14 109/69  11/30/14 122/72     Admit date:  (Not on file) Last encounter with RMR:  11/30/2014   Allergy Imdur and Fentanyl  Current Outpatient Prescriptions  Medication Sig Dispense Refill  . aspirin EC 81 MG tablet Take 81 mg by mouth daily.    Marland Kitchen atorvastatin (LIPITOR) 40 MG tablet TAKE 1 TABLET EVERY DAY 15 tablet 0  . furosemide (LASIX) 20 MG tablet Take 40 mg by mouth daily.     . nebivolol (BYSTOLIC) 10 MG tablet Take 10 mg by mouth daily.    . nitroGLYCERIN (NITROSTAT) 0.4 MG SL tablet Place 1 tablet (0.4 mg total) under the tongue every 5 (five) minutes as needed for chest pain. 25 tablet 3  . omeprazole (PRILOSEC) 40 MG capsule Take 40 mg by mouth 2 (two) times daily.    . ranolazine (RANEXA) 500 MG 12 hr tablet Take 500 mg by mouth 2 (two) times daily.    . tadalafil (CIALIS) 5 MG tablet Take 5 mg by mouth daily.    Marland Kitchen testosterone cypionate (DEPOTESTOTERONE CYPIONATE) 200 MG/ML injection Inject 0.5 mLs into the muscle every 14 (fourteen) days.     . valsartan (DIOVAN) 80 MG tablet Take 1 tablet (80 mg total) by mouth daily. 30 tablet 6  . Vitamin D, Ergocalciferol, (DRISDOL) 50000 UNITS CAPS Take 1 capsule by mouth Once a week. sunday    . zolmitriptan (ZOMIG) 5 MG tablet Take 1 tablet by mouth every 2 (two) hours as needed for migraine.      No current facility-administered medications  for this visit.    Discontinued Meds:   There are no discontinued medications.  Patient Active Problem List   Diagnosis Date Noted  . Essential hypertension   . Unstable angina (HCC)   . Morbid obesity (HCC) 12/27/2011  . Edema 12/27/2011  . GERD (gastroesophageal reflux disease) 12/23/2011  . GI bleed 12/23/2011  . Hypertension 12/23/2011  . High cholesterol 12/23/2011    LABS    Component Value Date/Time   NA 133* 12/04/2014 0635   NA 136 11/30/2014 1630   NA 135 11/14/2012 0907   K 4.0 12/04/2014 0635   K 3.5 11/30/2014 1630   K 3.8 11/14/2012 0907   CL 101 12/04/2014 0635   CL 104 11/30/2014 1630   CL 101 11/14/2012 0907   CO2 25 12/04/2014 0635   CO2 27 11/30/2014 1630   CO2 30 11/14/2012 0907   GLUCOSE 111* 12/04/2014 0635   GLUCOSE 103* 11/30/2014 1630   GLUCOSE 128* 11/14/2012 0907   BUN 18 12/04/2014 0635   BUN 25* 11/30/2014 1630   BUN 15 11/14/2012 0907   CREATININE 0.95 12/04/2014 0635   CREATININE 1.38* 11/30/2014 1630  CREATININE 0.97 11/14/2012 0907   CALCIUM 8.8* 12/04/2014 0635   CALCIUM 8.9 11/30/2014 1630   CALCIUM 8.9 11/14/2012 0907   GFRNONAA >60 12/04/2014 0635   GFRNONAA 54* 11/30/2014 1630   GFRNONAA 89* 11/14/2012 0907   GFRAA >60 12/04/2014 0635   GFRAA >60 11/30/2014 1630   GFRAA >90 11/14/2012 0907   CMP     Component Value Date/Time   NA 133* 12/04/2014 0635   K 4.0 12/04/2014 0635   CL 101 12/04/2014 0635   CO2 25 12/04/2014 0635   GLUCOSE 111* 12/04/2014 0635   BUN 18 12/04/2014 0635   CREATININE 0.95 12/04/2014 0635   CALCIUM 8.8* 12/04/2014 0635   PROT 5.9* 11/14/2012 0907   ALBUMIN 2.9* 11/14/2012 0907   AST 15 11/14/2012 0907   ALT 12 11/14/2012 0907   ALKPHOS 80 11/14/2012 0907   BILITOT 0.4 11/14/2012 0907   GFRNONAA >60 12/04/2014 0635   GFRAA >60 12/04/2014 0635       Component Value Date/Time   WBC 9.7 11/30/2014 1630   WBC 8.4 11/29/2013 1254   WBC 7.3 11/14/2012 0907   HGB 16.5 11/30/2014 1630    HGB 14.0 11/29/2013 1254   HGB 11.8* 11/14/2012 0907   HCT 48.2 11/30/2014 1630   HCT 42.3 11/29/2013 1254   HCT 35.6* 11/14/2012 0907   MCV 96.6 11/30/2014 1630   MCV 93.0 11/29/2013 1254   MCV 93.2 11/14/2012 0907    Lipid Panel     Component Value Date/Time   CHOL 212* 03/01/2012 0000   TRIG 141 03/01/2012 0000   HDL 65 03/01/2012 0000   CHOLHDL 3.3 03/01/2012 0000   VLDL 28 03/01/2012 0000   LDLCALC 119* 03/01/2012 0000    ABG No results found for: PHART, PCO2ART, PO2ART, HCO3, TCO2, ACIDBASEDEF, O2SAT   No results found for: TSH BNP (last 3 results) No results for input(s): BNP in the last 8760 hours.  ProBNP (last 3 results) No results for input(s): PROBNP in the last 8760 hours.  Cardiac Panel (last 3 results) No results for input(s): CKTOTAL, CKMB, TROPONINI, RELINDX in the last 72 hours.  Iron/TIBC/Ferritin/ %Sat    Component Value Date/Time   IRON 83 12/23/2011 1045   TIBC 332 12/23/2011 1045   FERRITIN 92 12/23/2011 1045   IRONPCTSAT 25 12/23/2011 1045     EKG Orders placed or performed in visit on 11/30/14  . EKG 12-Lead     Prior Assessment and Plan Problem List as of 12/08/2014      Cardiovascular and Mediastinum   Hypertension   Essential hypertension   Unstable angina (HCC)     Digestive   GERD (gastroesophageal reflux disease)   GI bleed     Other   High cholesterol   Morbid obesity (HCC)   Edema       Imaging: Dg Chest 2 View  11/30/2014   CLINICAL DATA:  Chronic left chest pain which has worsened over the past 10 days and is associated with shortness of breath, discontinued heavy tobacco use in 2009  EXAM: CHEST  2 VIEW  COMPARISON:  CT scan of the chest of December 09, 2012  FINDINGS: The lungs are adequately inflated. There is no focal infiltrate. There is no pleural effusion. The interstitial markings are mildly prominent diffusely. The heart and pulmonary vascularity are normal. The mediastinum is normal in width. The bony thorax  exhibits no acute abnormality. A nerve stimulator electrode projects over the mid thoracic spinal canal.  IMPRESSION: There is  no active cardiopulmonary disease.   Electronically Signed   By: David  [place] M.D.   On: 11/30/2014 15:46

## 2014-12-08 NOTE — Patient Instructions (Signed)
Your physician recommends that you schedule a follow-up appointment in: As Needed  Your physician recommends that you continue on your current medications as directed. Please refer to the Current Medication list given to you today.  Thank you for choosing [facility]!    

## 2014-12-08 NOTE — Progress Notes (Signed)
Cardiology Office Note   Date:  12/08/2014   ID:  TREJON DUFORD, DOB 1955/09/16, MRN [id number]  PCP:  Josue Hector, MD  Cardiologist:  Inis Sizer, NP   Chief Complaint  Patient presents with  . Chest Pain  . Shortness of Breath      History of Present Illness: Antonio Lucas is a 59 y.o. male who presents for ongoing assessment and management of recurrent chest pain.  Multiple cardiovascular risk factors.  On last office visit the patient was sent for cardiac catheterization for definitive evaluation of coronary anatomy in the setting of recurrent chest pain.catheterization resulted:   Prox LAD lesion, 20% stenosed.  Prox RCA to Mid RCA lesion, 20% stenosed.  The left ventricular systolic function is normal.  Normal LV function with an ejection fraction estimated in the 50-55% range. Left ventricular end-diastolic pressure 18 mmHg.  No significant coronary obstructive disease with smooth tubular 20% narrowing in the proximal LAD, and 20% RCA stenosis in a dominant RCA. Normal ramus intermediate, and normal large left circumflex coronary artery with a large left atrial circumflex branch which supplies the SA normal artery.  I have given him a copy of his catheterization report and answered.  Multiple questions again concerning his discomfort in his chest and shortness of breath.  Past Medical History  Diagnosis Date  . Pneumonia 1995  . GERD (gastroesophageal reflux disease)   . Arthritis   . Hyperlipemia   . Hypertension     Past Surgical History  Procedure Laterality Date  . Back surgery      02.2011- HE HAD SOME NECK PROBLEMS WITH C7 .  . Carpal tunnel release      both arms  . Tonsillectomy    . Esophagogastroduodenoscopy (egd) with esophageal dilation  01/07/2012    Procedure: ESOPHAGOGASTRODUODENOSCOPY (EGD) WITH ESOPHAGEAL DILATION;  Surgeon: Malissa Hippo, MD;  [facility];  Service: Endoscopy;  Laterality: N/A;  145   . Colonoscopy  02/16/2012    Procedure: COLONOSCOPY;  Surgeon: Malissa Hippo, MD;  [facility];  Service: Endoscopy;  Laterality: N/A;  730  . Esophagogastroduodenoscopy N/A 12/14/2013    Procedure: ESOPHAGOGASTRODUODENOSCOPY (EGD);  Surgeon: Malissa Hippo, MD;  [facility];  Service: Endoscopy;  Laterality: N/A;  1030  . Maloney dilation N/A 12/14/2013    Procedure: Elease Hashimoto DILATION;  Surgeon: Malissa Hippo, MD;  [facility];  Service: Endoscopy;  Laterality: N/A;  . Cardiac catheterization N/A 12/04/2014    Procedure: Left Heart Cath and Coronary Angiography;  Surgeon: Lennette Bihari, MD;  [facility];  Service: Cardiovascular;  Laterality: N/A;     Current Outpatient Prescriptions  Medication Sig Dispense Refill  . aspirin EC 81 MG tablet Take 81 mg by mouth daily.    Marland Kitchen atorvastatin (LIPITOR) 40 MG tablet TAKE 1 TABLET EVERY DAY 15 tablet 0  . furosemide (LASIX) 20 MG tablet Take 40 mg by mouth daily.     . nebivolol (BYSTOLIC) 10 MG tablet Take 10 mg by mouth daily.    . nitroGLYCERIN (NITROSTAT) 0.4 MG SL tablet Place 1 tablet (0.4 mg total) under the tongue every 5 (five) minutes as needed for chest pain. 25 tablet 3  . omeprazole (PRILOSEC) 40 MG capsule Take 40 mg by mouth 2 (two) times daily.    . ranolazine (RANEXA) 500 MG 12 hr tablet Take 500 mg by mouth 2 (two) times daily.    Marland Kitchen  tadalafil (CIALIS) 5 MG tablet Take 5 mg by mouth daily.    Marland Kitchen testosterone cypionate (DEPOTESTOTERONE CYPIONATE) 200 MG/ML injection Inject 0.5 mLs into the muscle every 14 (fourteen) days.     . valsartan (DIOVAN) 80 MG tablet Take 1 tablet (80 mg total) by mouth daily. 30 tablet 6  . Vitamin D, Ergocalciferol, (DRISDOL) 50000 UNITS CAPS Take 1 capsule by mouth Once a week. sunday    . zolmitriptan (ZOMIG) 5 MG tablet Take 1 tablet by mouth every 2 (two) hours as needed for migraine.      No current facility-administered medications for this  visit.    Allergies:   Imdur and Fentanyl    Social History:  The patient  reports that he quit smoking about 7 years ago. His smoking use included Cigarettes. He started smoking about 52 years ago. He has a 80 pack-year smoking history. He has never used smokeless tobacco. He reports that he drinks alcohol. He reports that he does not use illicit drugs.   Family History:  The patient's family history includes Heart attack in his father.    ROS: All other systems are reviewed and negative. Unless otherwise mentioned in H&P    PHYSICAL EXAM: VS:  BP 110/74 mmHg  Pulse 70  Ht 5\' 7"  (1.702 m)  Wt 287 lb (130.182 kg)  BMI 44.94 kg/m2  SpO2 97% , BMI Body mass index is 44.94 kg/(m^2). GEN: Well nourished, well developed, in no acute distress HEENT: normal Neck: no JVD, carotid bruits, or masses Cardiac: RRR; no murmurs, rubs, or gallops,no edema  Respiratory:  clear to auscultation bilaterally, normal work of breathing GI: soft, nontender, nondistended, + BS MS: no deformity or atrophyhe does have some ecchymosis noted in the right groin site, this is dissipating, but moving lower down into his leg.  There is no evidence of hematoma.  It is not sore. Skin: warm and dry, no rash Neuro:  Strength and sensation are intact Psych: euthymic mood, full affect Recent Labs: 11/30/2014: Hemoglobin 16.5; Platelets 196 12/04/2014: BUN 18; Creatinine, Ser 0.95; Potassium 4.0; Sodium 133*    Lipid Panel    Component Value Date/Time   CHOL 212* 03/01/2012 0000   TRIG 141 03/01/2012 0000   HDL 65 03/01/2012 0000   CHOLHDL 3.3 03/01/2012 0000   VLDL 28 03/01/2012 0000   LDLCALC 119* 03/01/2012 0000      Wt Readings from Last 3 Encounters:  12/08/14 287 lb (130.182 kg)  12/04/14 280 lb (127.007 kg)  11/30/14 279 lb 9.6 oz (126.826 kg)      ASSESSMENT AND PLAN:  1. Chest pain: Unlikely that this is cardiac in etiology.  I have gone over his cardiac catheterization results and answered.   Multiple questions concerning his coronary anatomy.  He continues to have chest discomfort.  He had been on isosorbide, but states that this gave him a headache and also made him very nauseated and he refuses this.  He does not wish to take any other medications.  He is currently on 500 mg every 12 hours, Ranexa.  We will not increase the dose at this time.we will see him on a when necessary basis.  2. Hypertension: Blood pressure is currently very well controlled.  I have asked him to take the Lasix as needed as he has had evidence of dehydration prolapse in the past.  He states he continues to retain fluid.  I have advised him to followup with his primary care physician concerning this.  He is to avoid salt.  3. Obesity: he states he is trying to lose weight and limit his calories, he is on the Atkins diet, however, he has not lost anything significant, and states that he finds it hard exercise with chronic back pain.  He is asking for alternative exercise regimen.  I have advised him to swim or use a stationary bike if this is not hard on the back wrist joints.  He they are thinking of going to the [facility].  4. OSA: he is seeking alternative CPAP machine as he states he did not tolerate it very well.  He is advised to see his primary care physician for further recommendations and referrals. Current medicines are reviewed at length with the patient today.    Labs/ tests ordered today include: None No orders of the defined types were placed in this encounter.     Disposition:   FU with as needed. Signed, Joni Reining, NP  12/08/2014 3:59 PM    [facility] [address] [place], [place] [address] Phone: [phone]; Fax: [phone]

## 2015-01-29 ENCOUNTER — Other Ambulatory Visit: Payer: Self-pay | Admitting: *Deleted

## 2015-01-29 MED ORDER — VALSARTAN 80 MG PO TABS
80.0000 mg | ORAL_TABLET | Freq: Every day | ORAL | Status: DC
Start: 2015-01-29 — End: 2016-04-11

## 2015-02-22 ENCOUNTER — Other Ambulatory Visit (HOSPITAL_COMMUNITY): Payer: Self-pay | Admitting: Adult Health Nurse Practitioner

## 2015-02-22 ENCOUNTER — Ambulatory Visit (HOSPITAL_COMMUNITY)
Admission: RE | Admit: 2015-02-22 | Discharge: 2015-02-22 | Disposition: A | Discharge status: Home / Self Care | Payer: Federal, State, Local not specified - PPO | Source: Ambulatory Visit | Attending: Adult Health Nurse Practitioner | Admitting: Adult Health Nurse Practitioner

## 2015-02-22 DIAGNOSIS — I517 Cardiomegaly: Secondary | ICD-10-CM | POA: Insufficient documentation

## 2015-02-22 DIAGNOSIS — R0602 Shortness of breath: Secondary | ICD-10-CM | POA: Insufficient documentation

## 2015-02-22 DIAGNOSIS — R079 Chest pain, unspecified: Secondary | ICD-10-CM | POA: Insufficient documentation

## 2015-02-22 DIAGNOSIS — E785 Hyperlipidemia, unspecified: Secondary | ICD-10-CM | POA: Insufficient documentation

## 2015-02-22 DIAGNOSIS — R05 Cough: Secondary | ICD-10-CM | POA: Diagnosis not present

## 2015-02-22 DIAGNOSIS — K219 Gastro-esophageal reflux disease without esophagitis: Secondary | ICD-10-CM | POA: Insufficient documentation

## 2015-02-22 DIAGNOSIS — Z87891 Personal history of nicotine dependence: Secondary | ICD-10-CM | POA: Diagnosis not present

## 2015-02-22 DIAGNOSIS — I10 Essential (primary) hypertension: Secondary | ICD-10-CM | POA: Insufficient documentation

## 2015-02-22 IMAGING — DX DG CHEST 2V
2 series · 2 of 2 positions shown · non-contrast
Comparison: [DATE]

CLINICAL DATA: Shortness of breath and LEFT side chest pain
radiating to LEFT arm intermittently for 1 year, productive cough
GERD

EXAM:
CHEST  2 VIEW

[chest pa]
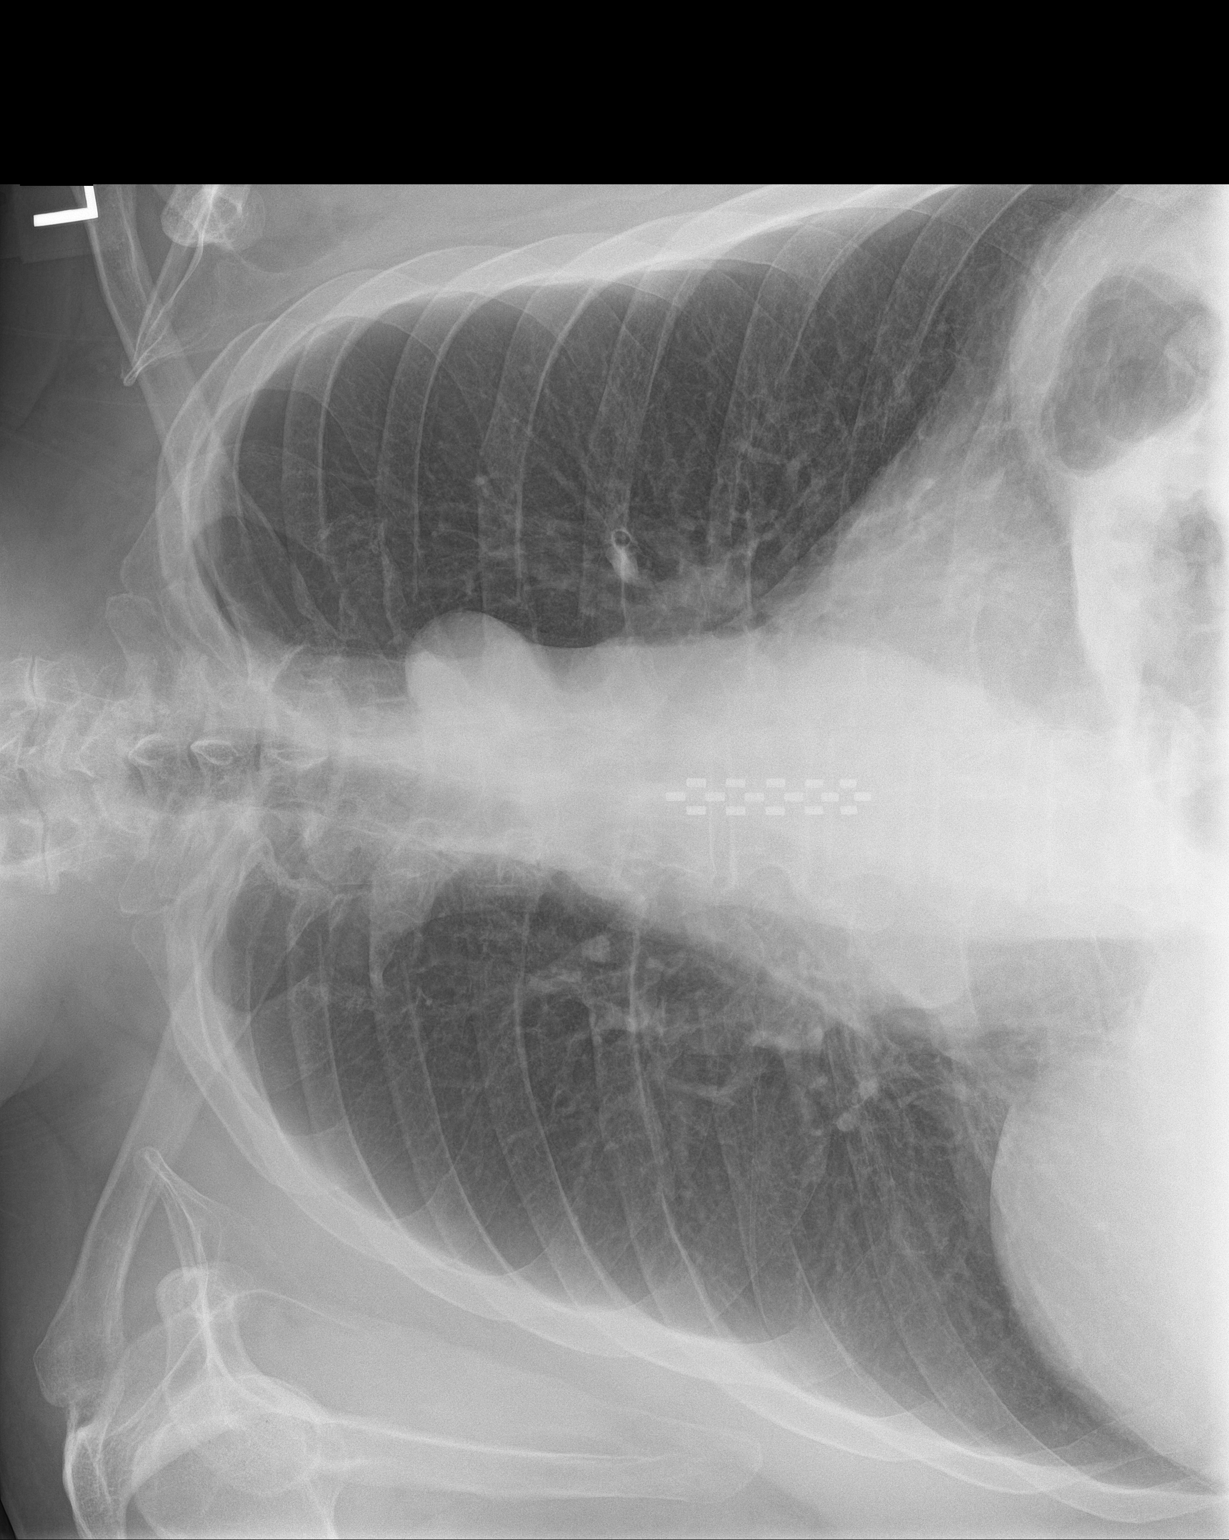

[chest lat]
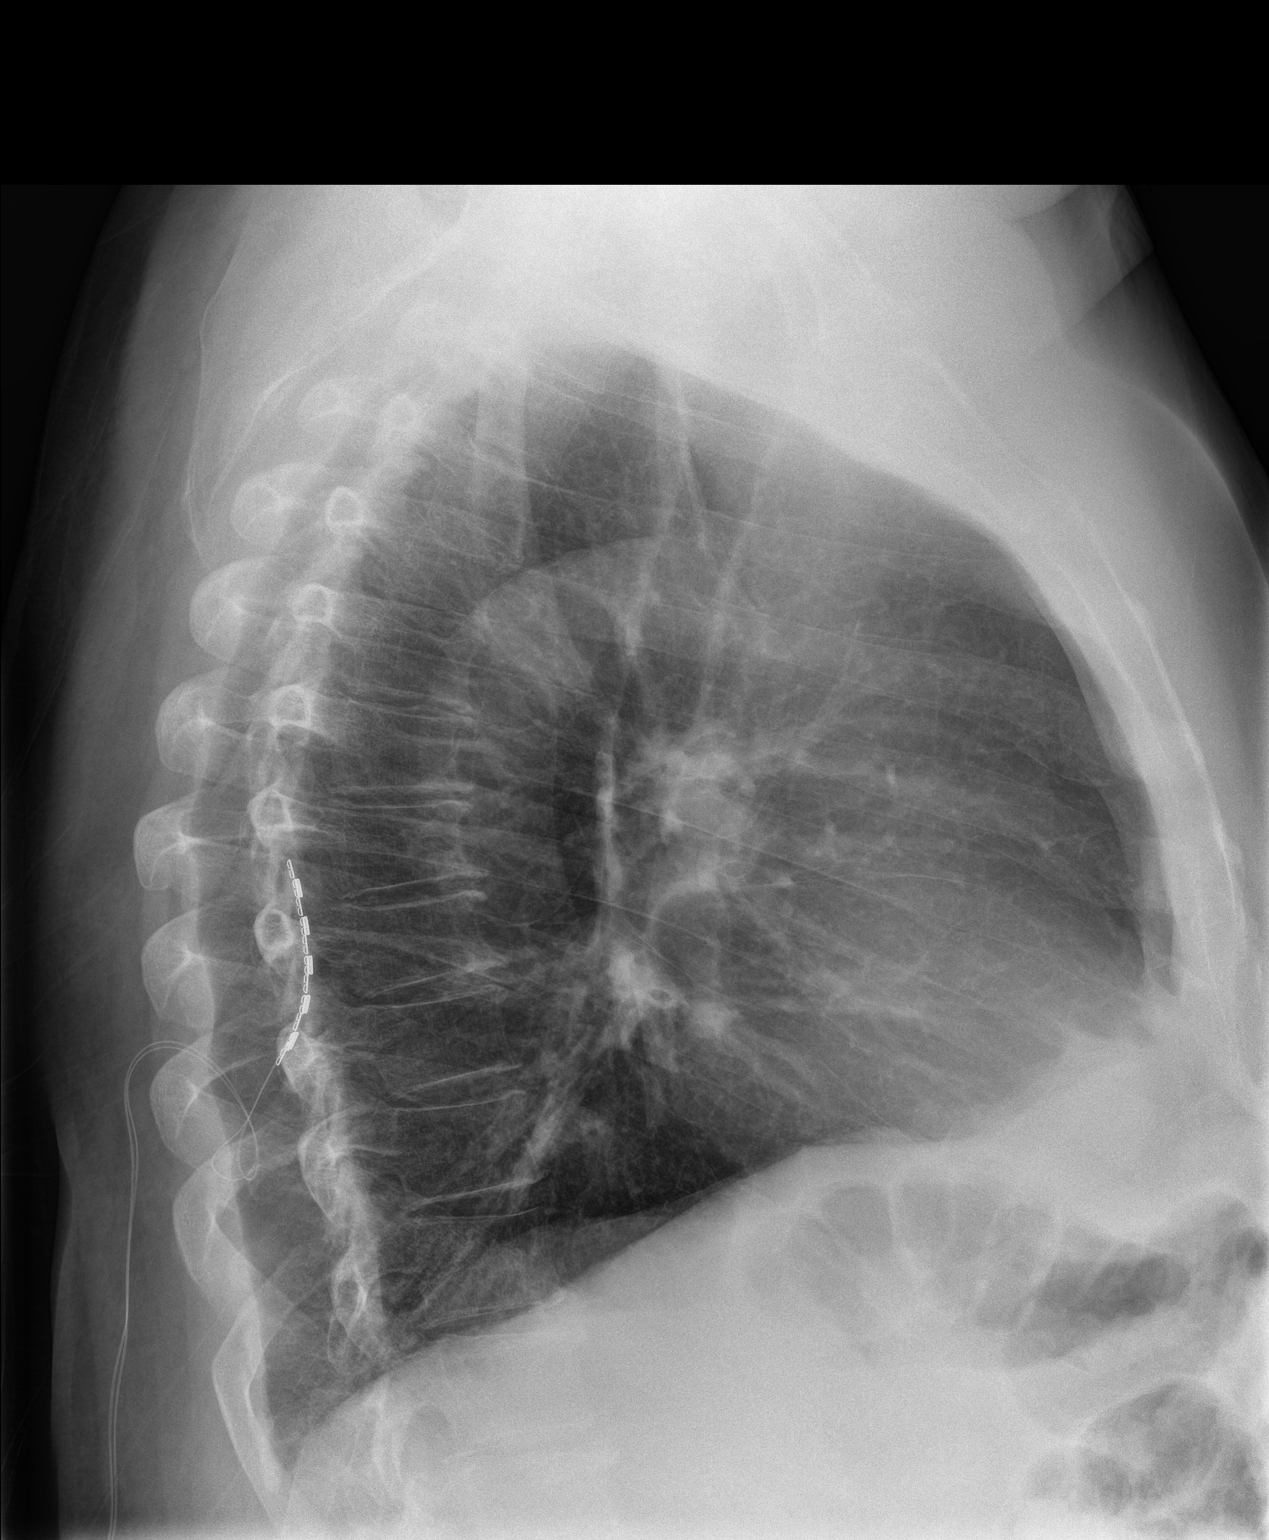

[2 of 2 positions shown; findings below may reference images not displayed]

FINDINGS: Intraspinal stimulator leads stable projecting over T7 to T9.

Borderline enlargement of cardiac silhouette.

Tortuosity of thoracic aorta.

Mediastinal contours and pulmonary vascularity normal.

Bronchitic changes without infiltrate, pleural effusion or
pneumothorax.

Bones demineralized without acute osseous findings.
IMPRESSION: Borderline enlargement of cardiac silhouette.

Bronchitic changes without infiltrate.

## 2015-03-14 ENCOUNTER — Encounter: Payer: Self-pay | Admitting: Cardiovascular Disease

## 2015-05-28 ENCOUNTER — Telehealth (INDEPENDENT_AMBULATORY_CARE_PROVIDER_SITE_OTHER): Payer: Self-pay | Admitting: Internal Medicine

## 2015-05-28 NOTE — Telephone Encounter (Signed)
I don't know anything about this. I think Dr Karilyn Cotarehman would need to write something up or let us know what he wants it to say?

## 2015-05-28 NOTE — Telephone Encounter (Signed)
To be addressed with Dr.Rehman. 

## 2015-05-28 NOTE — Telephone Encounter (Signed)
Ann - do you know anything about this?

## 2015-05-28 NOTE — Telephone Encounter (Signed)
Mr. Antonio Lucas called saying he's scheduled to have Bariatric surgery and he needs a letter of release from Dr. Karilyn Cotaehman sent to his surgeon stating he's at no increased risk to have the surgery. The pt would like a phone call regarding this.  Pt's ph# [phone] or day: [phone]  Thank you.

## 2015-05-30 NOTE — Telephone Encounter (Signed)
Awaiitng to review with Dr.Rehman.

## 2015-06-01 NOTE — Telephone Encounter (Signed)
Per Dr.Rehman - Patient needs to have this done by his Cardiologist, he will not write a letter. Patient was called and this message was left on his voicemail.

## 2015-06-07 ENCOUNTER — Ambulatory Visit (INDEPENDENT_AMBULATORY_CARE_PROVIDER_SITE_OTHER): Payer: Federal, State, Local not specified - PPO | Admitting: Cardiovascular Disease

## 2015-06-07 ENCOUNTER — Encounter: Payer: Self-pay | Admitting: Cardiovascular Disease

## 2015-06-07 VITALS — BP 125/79 | HR 71 | Ht 67.0 in | Wt 299.0 lb

## 2015-06-07 DIAGNOSIS — I872 Venous insufficiency (chronic) (peripheral): Secondary | ICD-10-CM

## 2015-06-07 DIAGNOSIS — I2510 Atherosclerotic heart disease of native coronary artery without angina pectoris: Secondary | ICD-10-CM

## 2015-06-07 DIAGNOSIS — Z01818 Encounter for other preprocedural examination: Secondary | ICD-10-CM | POA: Diagnosis not present

## 2015-06-07 DIAGNOSIS — I2583 Coronary atherosclerosis due to lipid rich plaque: Secondary | ICD-10-CM

## 2015-06-07 DIAGNOSIS — I10 Essential (primary) hypertension: Secondary | ICD-10-CM

## 2015-06-07 DIAGNOSIS — E785 Hyperlipidemia, unspecified: Secondary | ICD-10-CM

## 2015-06-07 NOTE — Progress Notes (Signed)
Patient ID: [name], male   DOB: 08/06/1955, 60 y.o.   MRN: [id number]      SUBJECTIVE: The patient presents for preoperative risk stratification. He underwent coronary angiography on 12/04/14 which demonstrated mild nonobstructive disease. He is feeling well and plans to undergo gastric bypass surgery this June.  Review of Systems: As per "subjective", otherwise negative.  Allergies  Allergen Reactions  . Imdur [Isosorbide Nitrate] Nausea Only    Headache also   . Fentanyl Nausea And Vomiting    Current Outpatient Prescriptions  Medication Sig Dispense Refill  . atorvastatin (LIPITOR) 40 MG tablet TAKE 1 TABLET EVERY DAY 15 tablet 0  . furosemide (LASIX) 20 MG tablet Take 40 mg by mouth daily.     Marland Kitchen. lamoTRIgine (LAMICTAL) 100 MG tablet Take 100 mg by mouth at bedtime.  0  . LATUDA 20 MG TABS tablet Take 1 tablet by mouth daily.    . nebivolol (BYSTOLIC) 10 MG tablet Take 10 mg by mouth daily.    . nitroGLYCERIN (NITROSTAT) 0.4 MG SL tablet Place 1 tablet (0.4 mg total) under the tongue every 5 (five) minutes as needed for chest pain. 25 tablet 3  . omeprazole (PRILOSEC) 40 MG capsule Take 40 mg by mouth 2 (two) times daily.    . phentermine (ADIPEX-P) 37.5 MG tablet Take 0.5 tablets by mouth daily.   1  . tadalafil (CIALIS) 5 MG tablet Take 5 mg by mouth daily.    Marland Kitchen. testosterone cypionate (DEPOTESTOTERONE CYPIONATE) 200 MG/ML injection Inject 0.5 mLs into the muscle every 14 (fourteen) days.     . TRINTELLIX 5 MG TABS Take 1 tablet by mouth daily.    . valsartan (DIOVAN) 80 MG tablet Take 1 tablet (80 mg total) by mouth daily. 90 tablet 3  . Vitamin D, Ergocalciferol, (DRISDOL) 50000 UNITS CAPS Take 1 capsule by mouth Once a week. sunday    . zolmitriptan (ZOMIG) 5 MG tablet Take 1 tablet by mouth every 2 (two) hours as needed for migraine.      No current facility-administered medications for this visit.    Past Medical History  Diagnosis Date  . Pneumonia 1995  .  GERD (gastroesophageal reflux disease)   . Arthritis   . Hyperlipemia   . Hypertension     Past Surgical History  Procedure Laterality Date  . Back surgery      02 .2011- HE HAD SOME NECK PROBLEMS WITH C7 .  . Carpal tunnel release      both arms  . Tonsillectomy    . Esophagogastroduodenoscopy (egd) with esophageal dilation  01/07/2012    Procedure: ESOPHAGOGASTRODUODENOSCOPY (EGD) WITH ESOPHAGEAL DILATION;  Surgeon: Malissa HippoNajeeb U Rehman, MD;  [facility];  Service: Endoscopy;  Laterality: N/A;  145  . Colonoscopy  02/16/2012    Procedure: COLONOSCOPY;  Surgeon: Malissa HippoNajeeb U Rehman, MD;  [facility];  Service: Endoscopy;  Laterality: N/A;  730  . Esophagogastroduodenoscopy N/A 12/14/2013    Procedure: ESOPHAGOGASTRODUODENOSCOPY (EGD);  Surgeon: Malissa HippoNajeeb U Rehman, MD;  [facility];  Service: Endoscopy;  Laterality: N/A;  1030  . Maloney dilation N/A 12/14/2013    Procedure: Elease HashimotoMALONEY DILATION;  Surgeon: Malissa HippoNajeeb U Rehman, MD;  [facility];  Service: Endoscopy;  Laterality: N/A;  . Cardiac catheterization N/A 12/04/2014    Procedure: Left Heart Cath and Coronary Angiography;  Surgeon: Lennette Biharihomas A Kelly, MD;  [facility]: [facility];  Service: Cardiovascular;  Laterality: N/A;    Social  History   Social History  . Marital Status: Married    Spouse Name: N/A  . Number of Children: N/A  . Years of Education: N/A   Occupational History  . Not on file.   Social History Main Topics  . Smoking status: Former Smoker -- 2.00 packs/day for 40 years    Types: Cigarettes    Start date: 03/03/1962    Quit date: 03/04/2007  . Smokeless tobacco: Never Used  . Alcohol Use: 0.0 oz/week    0 Standard drinks or equivalent per week     Comment: glass of wine daily  . Drug Use: No  . Sexual Activity: Not on file   Other Topics Concern  . Not on file   Social History Narrative   He has 3 children  With one son with Epsteins anomaly. He ihas has surgical  surgical repair . Ericberto stopped smoking 3 years ago one time , he smoked 2-3 packs per day. He will drink one glass of wine per day. He works on [employer].     Filed Vitals:   06/07/15 1115  BP: 125/79  Pulse: 71  Height:  (1.702 m)  Weight: 299 lb (135.626 kg)  SpO2: 93%    PHYSICAL EXAM General: NAD, obese HEENT: Normal. Neck: No JVD, no thyromegaly. Lungs: Clear to auscultation bilaterally with normal respiratory effort. CV: Nondisplaced PMI. Regular rate and rhythm, normal S1/S2, no S3/S4, no murmur. Trivial bilateral pretibial and periankle edema. Chronic venous stasis dermatitis.  Abdomen: Firm, nontender, obese, no distention.  Neurologic: Alert and oriented.  Psych: Normal affect. Skin: Chronic venous stasis dermatitis. Musculoskeletal: Normal range of motion, no gross deformities. Extremities: No clubbing or cyanosis.   ECG: Most recent ECG reviewed.      ASSESSMENT AND PLAN: 1. CAD: Nonobstructive CAD by coronary angiography on 12/04/14. On Lipitor and Bystolic.  2. Hyperlipidemia: Continue Lipitor 40 mg daily.  3. Essential HTN: Controlled. No changes.  4. Lower extremity edema/chronic venous insufficiency: I suspect a major cause is chronic venous insufficiency in the setting of abdominal obesity. I previously recommended the use of compression stockings.  5. Preoperative risk stratification: Given the lack of hemodynamically significant coronary artery disease, I feel he can safely proceed with the surgery with no further cardiac testing.  Dispo: fu prn.  Prentice Docker, M.D., F.A.C.C.

## 2015-06-07 NOTE — Patient Instructions (Signed)
Medication Instructions:  Your physician recommends that you continue on your current medications as directed. Please refer to the Current Medication list given to you today.   Labwork: NONE  Testing/Procedures: NONE  Follow-Up: Your physician recommends that you schedule a follow-up appointment in: AS NEEDED      Any Other Special Instructions Will Be Listed Below (If Applicable).     If you need a refill on your cardiac medications before your next appointment, please call your pharmacy.   

## 2015-06-08 ENCOUNTER — Telehealth: Payer: Self-pay | Admitting: *Deleted

## 2015-06-08 NOTE — Telephone Encounter (Signed)
[facility] pt was seen by Dr. Purvis SheffieldKoneswaran 06/07/15, verified she was able to see OV which stated pt was cleared for surgery, and she was able to see this in care everywhere.

## 2016-04-11 ENCOUNTER — Other Ambulatory Visit: Payer: Self-pay | Admitting: Cardiovascular Disease

## 2018-02-10 ENCOUNTER — Emergency Department (HOSPITAL_COMMUNITY)
Admission: EM | Admit: 2018-02-10 | Discharge: 2018-02-10 | Disposition: A | Discharge status: Home / Self Care | Payer: Federal, State, Local not specified - PPO | Attending: Emergency Medicine | Admitting: Emergency Medicine

## 2018-02-10 ENCOUNTER — Emergency Department (HOSPITAL_COMMUNITY): Payer: Federal, State, Local not specified - PPO

## 2018-02-10 ENCOUNTER — Encounter (HOSPITAL_COMMUNITY): Payer: Self-pay | Admitting: Student

## 2018-02-10 ENCOUNTER — Other Ambulatory Visit: Payer: Self-pay

## 2018-02-10 DIAGNOSIS — W293XXA Contact with powered garden and outdoor hand tools and machinery, initial encounter: Secondary | ICD-10-CM | POA: Diagnosis not present

## 2018-02-10 DIAGNOSIS — D649 Anemia, unspecified: Secondary | ICD-10-CM | POA: Insufficient documentation

## 2018-02-10 DIAGNOSIS — I10 Essential (primary) hypertension: Secondary | ICD-10-CM | POA: Insufficient documentation

## 2018-02-10 DIAGNOSIS — Y939 Activity, unspecified: Secondary | ICD-10-CM | POA: Diagnosis not present

## 2018-02-10 DIAGNOSIS — S81812A Laceration without foreign body, left lower leg, initial encounter: Secondary | ICD-10-CM | POA: Insufficient documentation

## 2018-02-10 DIAGNOSIS — Z79899 Other long term (current) drug therapy: Secondary | ICD-10-CM | POA: Diagnosis not present

## 2018-02-10 DIAGNOSIS — Y929 Unspecified place or not applicable: Secondary | ICD-10-CM | POA: Diagnosis not present

## 2018-02-10 DIAGNOSIS — Z87891 Personal history of nicotine dependence: Secondary | ICD-10-CM | POA: Insufficient documentation

## 2018-02-10 DIAGNOSIS — Y999 Unspecified external cause status: Secondary | ICD-10-CM | POA: Insufficient documentation

## 2018-02-10 LAB — BASIC METABOLIC PANEL
Anion gap: 9 (ref 5–15)
BUN: 7 mg/dL — AB (ref 8–23)
CO2: 23 mmol/L (ref 22–32)
CREATININE: 0.96 mg/dL (ref 0.61–1.24)
Calcium: 8.5 mg/dL — ABNORMAL LOW (ref 8.9–10.3)
Chloride: 110 mmol/L (ref 98–111)
GFR calc non Af Amer: >60 mL/min (ref >60)
GLUCOSE: 89 mg/dL (ref 70–99)
Potassium: 3.4 mmol/L — ABNORMAL LOW (ref 3.5–5.1)
SODIUM: 142 mmol/L (ref 135–145)

## 2018-02-10 LAB — CBC
HEMATOCRIT: 38.8 % — AB (ref 39.0–52.0)
HEMOGLOBIN: 12 g/dL — AB (ref 13.0–17.0)
MCH: 32.2 pg (ref 26.0–34.0)
MCHC: 30.9 g/dL (ref 30.0–36.0)
MCV: 104 fL — AB (ref 80.0–100.0)
NRBC: 0 % (ref 0.0–0.2)
Platelets: 164 10*3/uL (ref 150–400)
RBC: 3.73 MIL/uL — AB (ref 4.22–5.81)
RDW: 13.7 % (ref 11.5–15.5)
WBC: 9.1 10*3/uL (ref 4.0–10.5)

## 2018-02-10 IMAGING — DX DG TIBIA/FIBULA 2V*L*
3 series · 3 of 3 positions shown · non-contrast
Comparison: None.

CLINICAL DATA: Laceration to distal and medial lower leg.

EXAM:
LEFT TIBIA AND FIBULA - 2 VIEW

[tibia ap]
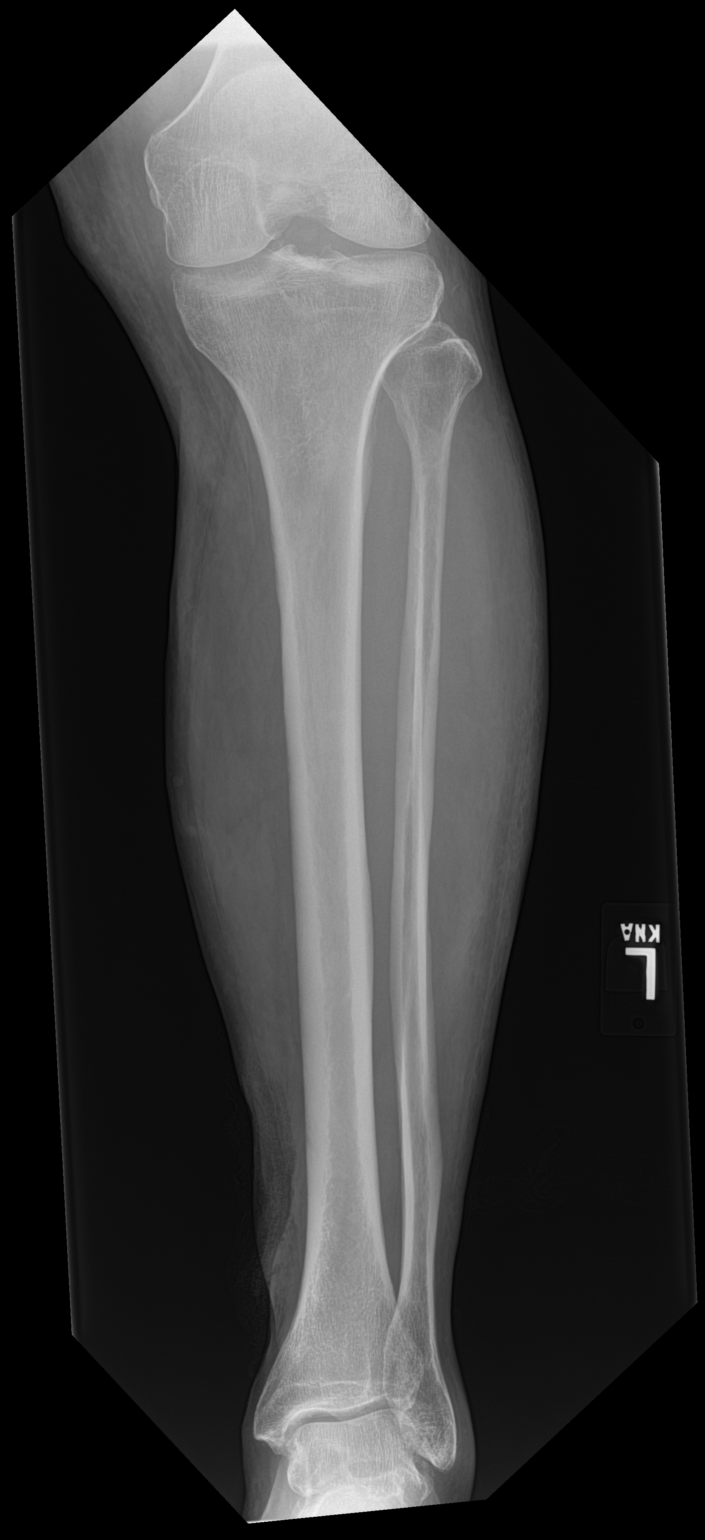

[tibia lat (1 of 2)]
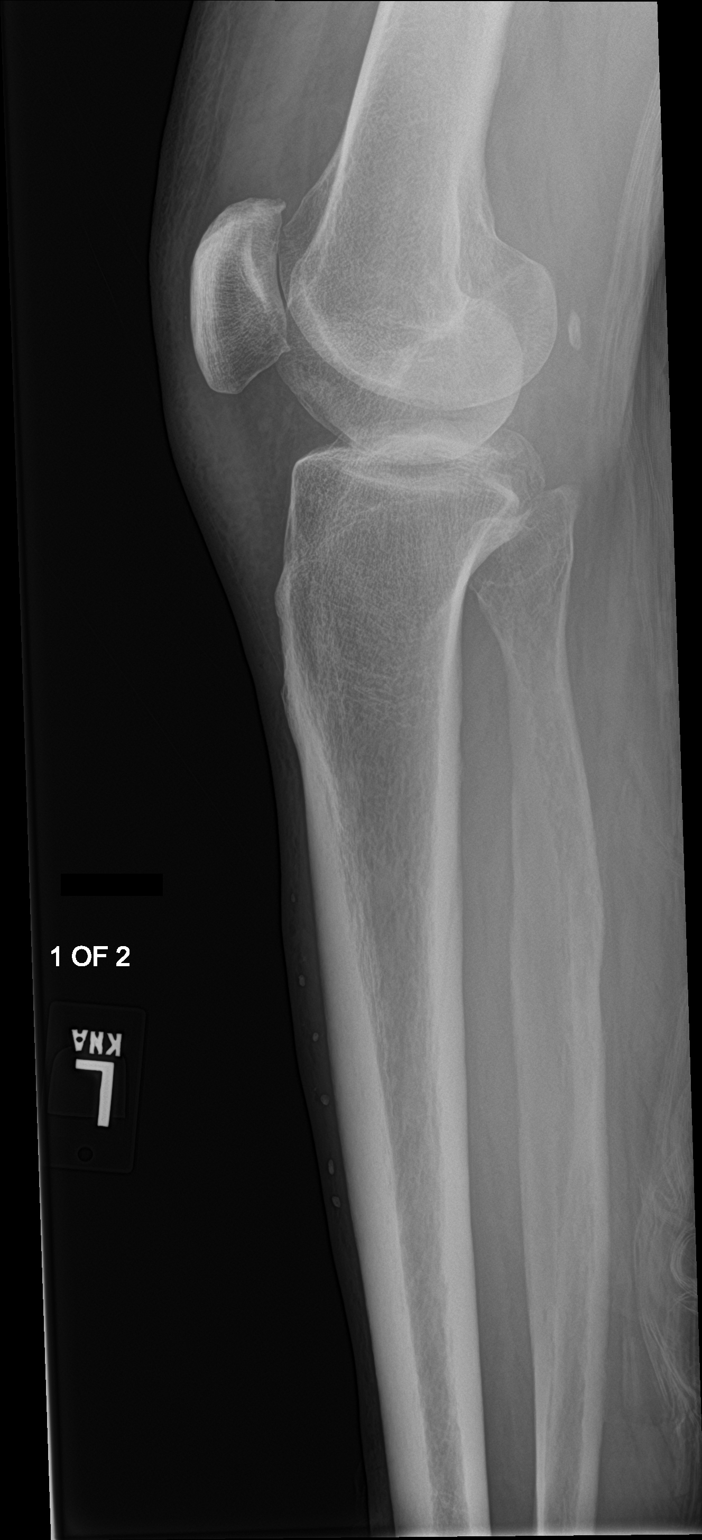

[tibia lat (2 of 2)]
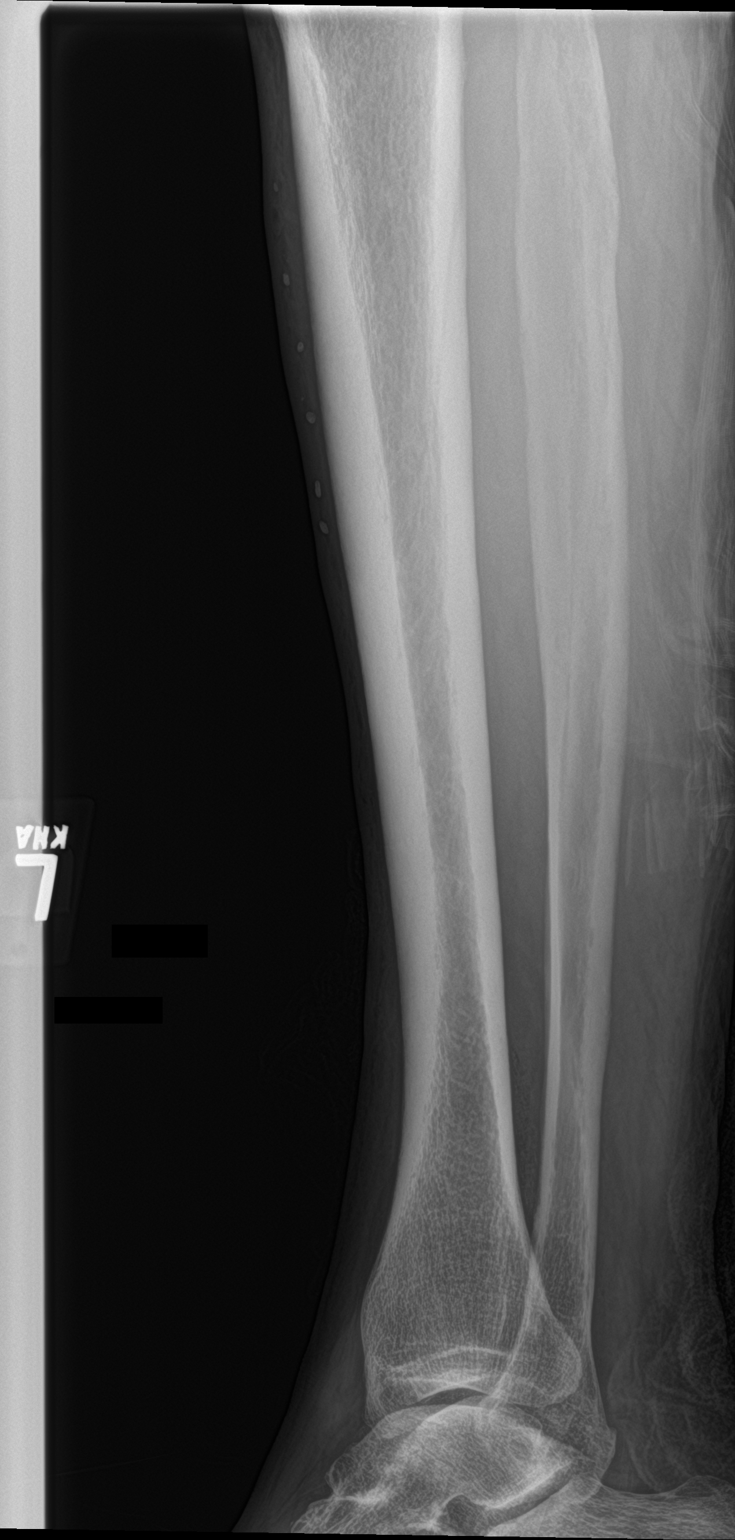

[3 of 3 positions shown; findings below may reference images not displayed]

FINDINGS: Soft tissue wound in the medial lower leg with bandage. No opaque
foreign body or fracture.
IMPRESSION: Soft tissue injury without opaque foreign body or fracture.

## 2018-02-10 MED ORDER — SODIUM CHLORIDE 0.9 % IV BOLUS
500.0000 mL | Freq: Once | INTRAVENOUS | Status: AC
  Administered 2018-02-10: 500 mL via INTRAVENOUS

## 2018-02-10 MED ORDER — LIDOCAINE HCL (PF) 1 % IJ SOLN
INTRAMUSCULAR | Status: AC
  Filled 2018-02-10: qty 5

## 2018-02-10 MED ORDER — MORPHINE SULFATE (PF) 4 MG/ML IV SOLN
4.0000 mg | Freq: Once | INTRAVENOUS | Status: AC
  Administered 2018-02-10: 4 mg via INTRAVENOUS
  Filled 2018-02-10: qty 1

## 2018-02-10 MED ORDER — ONDANSETRON HCL 4 MG/2ML IJ SOLN
4.0000 mg | Freq: Once | INTRAMUSCULAR | Status: AC
  Administered 2018-02-10: 4 mg via INTRAVENOUS
  Filled 2018-02-10: qty 2

## 2018-02-10 MED ORDER — AMOXICILLIN-POT CLAVULANATE 875-125 MG PO TABS: 1.0000 | ORAL_TABLET | Freq: Two times a day (BID) | ORAL | 0 refills | Status: DC

## 2018-02-10 MED ORDER — LIDOCAINE HCL (PF) 1 % IJ SOLN
10.0000 mL | Freq: Once | INTRAMUSCULAR | Status: AC
  Administered 2018-02-10: 10 mL
  Filled 2018-02-10: qty 10

## 2018-02-10 NOTE — ED Notes (Signed)
Patient transported to X-ray 

## 2018-02-10 NOTE — ED Triage Notes (Signed)
Pt presents to ED with full thickness wound following chainsaw accident. Bone visible. Bleeding controlled. Initial bp 70/30 but very little blood on scene, controlled by EMS arrival. Pt pale, diaphoretic, unable to get manual pressure. 600 NS bolus with improvement to 114/70, 16 mg Ketamine given for pain and 4 mg Zofran given for nausea.

## 2018-02-10 NOTE — Discharge Instructions (Addendum)
You were seen in the emergency department today for a laceration. Your laceration was closed with 3 absorbable stitches that will not need to be removed. There are 9 stitches on the surface that will need to come ou. Please keep this area clean and dry for the next 24 hours, after 24 hours you may get this area wet, but avoid soaking the area. Keep the area covered as best possible especially when in the sun to help in minimizing scarring.   Your blood work showed that your hemoglobin is 12.0 this is low, please have this rechecked by primary care within 1-2 weeks.   We are also playing you on augmentin to help prevent infection. Please take all of your antibiotics until finished. You may develop abdominal discomfort or diarrhea from the antibiotic.  You may help offset this with probiotics which you can buy at the store (ask your pharmacist if unable to find) or get probiotics in the form of eating yogurt. Do not eat or take the probiotics until 2 hours after your antibiotic. If you are unable to tolerate these side effects follow-up with your primary care provider or return to the emergency department.   If you begin to experience any blistering, rashes, swelling, or difficulty breathing seek medical care for evaluation of potentially more serious side effects.   Please be aware that this medication may interact with other medications you are taking, please be sure to discuss your medication list with your pharmacist.   You will need to have the stitches removed and the wound rechecked in 8-10 days. Please return to the emergency department, go to an urgent care, or see your primary care provider to have this performed. Return to the ER soon should you start to experience pus type drainage from the wound, redness around the wound, or fevers as this could indicate the area is infected, please return to the ER for any other worsening symptoms or concerns that you may have.

## 2018-02-10 NOTE — ED Provider Notes (Signed)
Medical screening examination/treatment/procedure(s) were conducted as a shared visit with non-physician practitioner(s) and myself.  I personally evaluated the patient during the encounter.  None He was working with a Chief Financial Officerchainsaw.  He lost control of it and cut his lower leg on the left inside surface.  Initially upon arrival, patient was hypotensive and pale.  There was not significant mount of blood loss identified.  Patient has since improved to have normal blood pressure and mental status.  Patient reports he has pain at the wound but has no other complaints.  Large irregular laceration to the medial lower leg.  Patient has intact flexion and extension of the foot.  Intact flexion extension of the toes.  No pulsatile bleeding.  Visually, the wound does not appear significantly contaminated.  Physician assistant to extensively clean wound and repair.  I agree with plan of management.   Arby BarrettePfeiffer, Antonio Overstreet, MD 02/10/18 [phone]

## 2018-02-10 NOTE — ED Notes (Signed)
ED Provider at bedside. 

## 2018-02-10 NOTE — ED Provider Notes (Signed)
[facility] EMERGENCY DEPARTMENT Provider Note   CSN: [id number] Arrival date & time: 02/10/18  1332     History   Chief Complaint Laceration  HPI Antonio Lucas is a 62 y.o. male with a hx of HTN, hyperlipidemia, and GERD who presents to the ED via EMS s/p LLE injury w/ chainsaw. Patient states that he accidentally cut the left lower leg with a chainsaw while he was cutting wood shortly prior to arrival. EMS was called. Upon their arrival patient did not appear to have significant blood loss, he was however noted to be hypotensive in the 70s/30s, patient relays at that time he did feel a bit lightheaded. EMS provided 600 cc of NS en route with improvement in pressures to 114/70. EMS also gave Ketamine for pain, patient relays not much change with this intervention. He states that the lightheadedness has resolved and his only complaint at present is pain to the LLE wound site. Pain is severe, no alleviating/aggravating factors. No associated head injury, LOC, or other areas of injury. He did not have chest pain or dyspnea at any point in time. He denies numbness, tingling, or weakness. Last tetanus was within 5 years.   HPI  Past Medical History:  Diagnosis Date  . Arthritis   . GERD (gastroesophageal reflux disease)   . Hyperlipemia   . Hypertension   . Pneumonia 1995    Patient Active Problem List   Diagnosis Date Noted  . Essential hypertension   . Unstable angina (HCC)   . Morbid obesity (HCC) 12/27/2011  . Edema 12/27/2011  . GERD (gastroesophageal reflux disease) 12/23/2011  . GI bleed 12/23/2011  . Hypertension 12/23/2011  . High cholesterol 12/23/2011    Past Surgical History:  Procedure Laterality Date  . BACK SURGERY     02.2011- HE HAD SOME NECK PROBLEMS WITH C7 .  . CARDIAC CATHETERIZATION N/A 12/04/2014   Procedure: Left Heart Cath and Coronary Angiography;  Surgeon: Lennette Bihari, MD;  Location: [facility] INVASIVE CV LAB;  Service:  Cardiovascular;  Laterality: N/A;  . CARPAL TUNNEL RELEASE     both arms  . COLONOSCOPY  02/16/2012   Procedure: COLONOSCOPY;  Surgeon: Malissa Hippo, MD;  Location: [facility] ENDO SUITE;  Service: Endoscopy;  Laterality: N/A;  730  . ESOPHAGOGASTRODUODENOSCOPY N/A 12/14/2013   Procedure: ESOPHAGOGASTRODUODENOSCOPY (EGD);  Surgeon: Malissa Hippo, MD;  Location: [facility] ENDO SUITE;  Service: Endoscopy;  Laterality: N/A;  1030  . ESOPHAGOGASTRODUODENOSCOPY (EGD) WITH ESOPHAGEAL DILATION  01/07/2012   Procedure: ESOPHAGOGASTRODUODENOSCOPY (EGD) WITH ESOPHAGEAL DILATION;  Surgeon: Malissa Hippo, MD;  Location: [facility] SUITE;  Service: Endoscopy;  Laterality: N/A;  145  . MALONEY DILATION N/A 12/14/2013   Procedure: Elease Hashimoto DILATION;  Surgeon: Malissa Hippo, MD;  Location: [facility] ENDO SUITE;  Service: Endoscopy;  Laterality: N/A;  . TONSILLECTOMY          Home Medications    Prior to Admission medications   Medication Sig Start Date End Date Taking? Authorizing Provider  atorvastatin (LIPITOR) 40 MG tablet TAKE 1 TABLET EVERY DAY 10/17/13   Laurey Morale, MD  furosemide (LASIX) 20 MG tablet Take 40 mg by mouth daily.     [provider]  lamoTRIgine (LAMICTAL) 100 MG tablet Take 100 mg by mouth at bedtime. 05/10/15   [provider]  LATUDA 20 MG TABS tablet Take 1 tablet by mouth daily. 06/04/15   [provider]  nebivolol (BYSTOLIC) 10 MG tablet Take 10  mg by mouth daily.    [provider]  nitroGLYCERIN (NITROSTAT) 0.4 MG SL tablet Place 1 tablet (0.4 mg total) under the tongue every 5 (five) minutes as needed for chest pain. 11/30/14   Jodelle Gross, NP  omeprazole (PRILOSEC) 40 MG capsule Take 40 mg by mouth 2 (two) times daily.    [provider]  phentermine (ADIPEX-P) 37.5 MG tablet Take 0.5 tablets by mouth daily.  04/20/15   [provider]  tadalafil (CIALIS) 5 MG tablet Take 5 mg by mouth daily.    [provider]    testosterone cypionate (DEPOTESTOTERONE CYPIONATE) 200 MG/ML injection Inject 0.5 mLs into the muscle every 14 (fourteen) days.  11/17/11   [provider]  TRINTELLIX 5 MG TABS Take 1 tablet by mouth daily. 06/05/15   [provider]  valsartan (DIOVAN) 80 MG tablet TAKE 1 TABLET (80 MG TOTAL) BY MOUTH DAILY. 04/11/16   Laqueta Linden, MD  Vitamin D, Ergocalciferol, (DRISDOL) 50000 UNITS CAPS Take 1 capsule by mouth Once a week. sunday 12/05/11   [provider]  zolmitriptan (ZOMIG) 5 MG tablet Take 1 tablet by mouth every 2 (two) hours as needed for migraine.  10/29/12   [provider]    Family History Family History  Problem Relation Age of Onset  . Heart attack Father     Social History Social History   Tobacco Use  . Smoking status: Former Smoker    Packs/day: 2.00    Years: 40.00    Pack years: 80.00    Types: Cigarettes    Start date: 03/03/1962    Last attempt to quit: 03/04/2007    Years since quitting: 10.9  . Smokeless tobacco: Never Used  Substance Use Topics  . Alcohol use: Yes    Alcohol/week: 0.0 standard drinks    Comment: glass of wine daily  . Drug use: No     Allergies   Imdur [isosorbide nitrate] and Fentanyl   Review of Systems Review of Systems  Respiratory: Negative for shortness of breath.   Cardiovascular: Negative for chest pain.  Musculoskeletal: Positive for myalgias (LLE). Negative for back pain and neck pain.  Skin: Positive for wound (LLE).  Neurological: Positive for light-headedness (resolved). Negative for seizures, syncope, weakness and numbness.  All other systems reviewed and are negative.    Physical Exam Updated Vital Signs BP 114/84   Pulse 77   Temp 97.9 F (36.6 C) (Oral)   Resp 16   Ht 5\' 7"  (1.702 m)   Wt 82.6 kg   SpO2 98%   BMI 28.54 kg/m   Physical Exam  Constitutional: He appears well-developed and well-nourished.  Non-toxic appearance. No distress.  HENT:  Head:  Normocephalic and atraumatic.  Eyes: Conjunctivae are normal. Right eye exhibits no discharge. Left eye exhibits no discharge.  Neck: Neck supple.  Cardiovascular: Normal rate and regular rhythm.  Pulses:      Dorsalis pedis pulses are 2+ on the right side, and 2+ on the left side.       Posterior tibial pulses are 2+ on the right side, and 2+ on the left side.  Pulmonary/Chest: Effort normal and breath sounds normal. No respiratory distress. He has no wheezes. He has no rhonchi. He has no rales.  Respiration even and unlabored  Abdominal: Soft. He exhibits no distension. There is no tenderness.  Musculoskeletal:  Upper extremities: normal AROM. nontender Back: no midline tenderness or palpable step off to cervical/thoracic/lumbar spine.  Lower extremities: Gaping full thickness laceration to the distal medial left lower leg that is 10cm in length. There is exposed tendon and periosteum but all underlying structures appear intact. He is NVI distally. Normal AROM to bilateral hips, knees, ankles, and all digits. Tender to palpation directly over laceration, otherwise nontender.   Neurological: He is alert.  Clear speech. Sensation grossly intact to bilateral lower extremities. 5/5 symmetric strength with plantar/dorsiflexion bilaterally. Ambulation deferred on initial assessment.   Skin: Skin is warm and dry. No rash noted.  Psychiatric: He has a normal mood and affect. His behavior is normal.  Nursing note and vitals reviewed.    ED Treatments / Results  Labs (all labs ordered are listed, but only abnormal results are displayed) Labs Reviewed  CBC - Abnormal; Notable for the following components:      Result Value   RBC 3.73 (*)    Hemoglobin 12.0 (*)    HCT 38.8 (*)    MCV 104.0 (*)    All other components within normal limits  BASIC METABOLIC PANEL - Abnormal; Notable for the following components:   Potassium 3.4 (*)    BUN 7 (*)    Calcium 8.5 (*)    All other components  within normal limits    EKG EKG Interpretation  Date/Time:  Wednesday February 10 2018 13:43:37 EST Ventricular Rate:  78 PR Interval:    QRS Duration: 89 QT Interval:  372 QTC Calculation: 419 R Axis:   42 Text Interpretation:  Sinus rhythm no acute ischemic changes. no old comparison Confirmed by Arby Barrette [phone]) on 02/10/2018 4:32:58 PM   Radiology Dg Tibia/fibula Left  Result Date: 02/10/2018 CLINICAL DATA:  Laceration to distal and medial lower leg. EXAM: LEFT TIBIA AND FIBULA - 2 VIEW COMPARISON:  None. FINDINGS: Soft tissue wound in the medial lower leg with bandage. No opaque foreign body or fracture. IMPRESSION: Soft tissue injury without opaque foreign body or fracture. Electronically Signed   By: Marnee Spring M.D.   On: 02/10/2018 14:18    Procedures .Marland KitchenLaceration Repair Date/Time: 02/10/2018 4:27 PM Performed by: Cherly Anderson, PA-C Authorized by: Cherly Anderson, PA-C   Consent:    Consent obtained:  Verbal   Consent given by:  Patient   Risks discussed:  Infection, need for additional repair, nerve damage, poor cosmetic result, pain, poor wound healing, vascular damage, tendon damage and retained foreign body   Alternatives discussed:  No treatment Anesthesia (see MAR for exact dosages):    Anesthesia method:  Local infiltration   Local anesthetic:  Lidocaine 1% w/o epi Laceration details:    Location:  Leg   Leg location:  L lower leg   Length (cm):  10 Repair type:    Repair type:  Intermediate Pre-procedure details:    Preparation:  Patient was prepped and draped in usual sterile fashion and imaging obtained to evaluate for foreign bodies Exploration:    Hemostasis achieved with:  Direct pressure   Wound exploration: wound explored through full range of motion and entire depth of wound probed and visualized     Wound extent: areolar tissue violated     Contaminated: yes (There are a few small strands of fabric present in the  wound- each was manually removed)   Treatment:    Area cleansed with:  Betadine   Amount of cleaning:  Standard   Irrigation solution:  Sterile water   Irrigation volume:  1.5 L   Irrigation method:  Pressure wash  Visualized foreign bodies/material removed: yes   Subcutaneous repair:    Suture size:  4-0   Suture material:  Vicryl   Suture technique:  Simple interrupted   Number of sutures:  3 Skin repair:    Repair method:  Sutures   Suture size:  4-0   Suture material:  Nylon   Suture technique:  Simple interrupted   Number of sutures:  9 Approximation:    Approximation:  Close Post-procedure details:    Dressing:  Antibiotic ointment and non-adherent dressing   Patient tolerance of procedure:  Tolerated well, no immediate complications   (including critical care time)  Medications Ordered in ED Medications  sodium chloride 0.9 % bolus 500 mL (500 mLs Intravenous New Bag/Given 02/10/18 1353)   Initial Impression / Assessment and Plan / ED Course  I have reviewed the triage vital signs and the nursing notes.  Pertinent labs & imaging results that were available during my care of the patient were reviewed by me and considered in my medical decision making (see chart for details).   Patient presents to the ED with laceration to LLE from chainsaw injury. He was noted to be hypotensive by EMS improved with fluids, suspect vasovagal to an extent secondary to pain and injury, improved with fluids. Given hypotension and brief lightheadedness basic labs & EKG checked. Hgb low at 12.0, has had low hgb in the past, doubt secondary to blood loss this acutely- will require PCP follow up. Mild hypokalemia/hypocalcemia- PCP recheck. EKG without significant change from prior. No other notable areas of acute injury related to incident today. Additional fluids ordered to help support analgesics being administered.   Regarding laceration: X-ray obtained in area of laceration, no  fractures/dislocations or apparent radiopaque foreign bodies. Pressure irrigation performed. Wound explored and base of wound visualized in a bloodless field- few small strands of clothing removed, no evidence of remaining visible FB. Laceration repair per procedure note above, tolerated well. Tetanus is up to date. Per discussion with Dr. Donnald GarrePfeiffer will start augmenttin. Discussed suture home care as well as need for wound recheck and suture removal in 8-10 days.  I discussed results, treatment plan, need for follow-up, and return precautions with the patient including signs of infection. Provided opportunity for questions, patient and his wife confirmed understanding and are in agreement with plan.    Findings and plan of care discussed with supervising physician Dr. Donnald GarrePfeiffer who personally evaluated and examined this patient and is in agreement.   Final Clinical Impressions(s) / ED Diagnoses   Final diagnoses:  Laceration of left lower extremity, initial encounter  Anemia, unspecified type    ED Discharge Orders         Ordered    amoxicillin-clavulanate (AUGMENTIN) 875-125 MG tablet  Every 12 hours     02/10/18 1640           Petrucelli, Pleas KochSamantha R, PA-C 02/10/18 1840    Arby BarrettePfeiffer, Marcy, MD 02/11/18 [phone]

## 2019-06-06 ENCOUNTER — Emergency Department (HOSPITAL_COMMUNITY): Payer: Medicare Other

## 2019-06-06 ENCOUNTER — Emergency Department (HOSPITAL_COMMUNITY)
Admission: EM | Admit: 2019-06-06 | Discharge: 2019-06-06 | Disposition: A | Discharge status: Home / Self Care | Payer: Medicare Other | Attending: Emergency Medicine | Admitting: Emergency Medicine

## 2019-06-06 ENCOUNTER — Encounter (HOSPITAL_COMMUNITY): Payer: Self-pay | Admitting: Emergency Medicine

## 2019-06-06 ENCOUNTER — Other Ambulatory Visit: Payer: Self-pay

## 2019-06-06 DIAGNOSIS — R0602 Shortness of breath: Secondary | ICD-10-CM | POA: Insufficient documentation

## 2019-06-06 DIAGNOSIS — R0789 Other chest pain: Secondary | ICD-10-CM | POA: Diagnosis not present

## 2019-06-06 DIAGNOSIS — R05 Cough: Secondary | ICD-10-CM | POA: Insufficient documentation

## 2019-06-06 DIAGNOSIS — U071 COVID-19: Secondary | ICD-10-CM | POA: Insufficient documentation

## 2019-06-06 DIAGNOSIS — R509 Fever, unspecified: Secondary | ICD-10-CM | POA: Diagnosis not present

## 2019-06-06 DIAGNOSIS — J1282 Pneumonia due to coronavirus disease 2019: Secondary | ICD-10-CM | POA: Diagnosis not present

## 2019-06-06 DIAGNOSIS — I10 Essential (primary) hypertension: Secondary | ICD-10-CM | POA: Diagnosis not present

## 2019-06-06 DIAGNOSIS — Z79899 Other long term (current) drug therapy: Secondary | ICD-10-CM | POA: Diagnosis not present

## 2019-06-06 HISTORY — DX: COVID-19: U07.1

## 2019-06-06 LAB — COMPREHENSIVE METABOLIC PANEL
ALT: 22 U/L (ref 0–44)
AST: 47 U/L — ABNORMAL HIGH (ref 15–41)
Albumin: 3 g/dL — ABNORMAL LOW (ref 3.5–5.0)
Alkaline Phosphatase: 60 U/L (ref 38–126)
Anion gap: 10 (ref 5–15)
BUN: 36 mg/dL — ABNORMAL HIGH (ref 8–23)
CO2: 24 mmol/L (ref 22–32)
Calcium: 9 mg/dL (ref 8.9–10.3)
Chloride: 107 mmol/L (ref 98–111)
Creatinine, Ser: 0.81 mg/dL (ref 0.61–1.24)
GFR calc Af Amer: >60 mL/min (ref >60)
GFR calc non Af Amer: >60 mL/min (ref >60)
Glucose, Bld: 135 mg/dL — ABNORMAL HIGH (ref 70–99)
Potassium: 4 mmol/L (ref 3.5–5.1)
Sodium: 141 mmol/L (ref 135–145)
Total Bilirubin: 0.5 mg/dL (ref 0.3–1.2)
Total Protein: 7.1 g/dL (ref 6.5–8.1)

## 2019-06-06 LAB — CBC WITH DIFFERENTIAL/PLATELET
Abs Immature Granulocytes: 0.06 10*3/uL (ref 0.00–0.07)
Basophils Absolute: 0 10*3/uL (ref 0.0–0.1)
Basophils Relative: 0 %
Eosinophils Absolute: 0 10*3/uL (ref 0.0–0.5)
Eosinophils Relative: 0 %
HCT: 48.8 % (ref 39.0–52.0)
Hemoglobin: 16.2 g/dL (ref 13.0–17.0)
Immature Granulocytes: 1 %
Lymphocytes Relative: 8 %
Lymphs Abs: 0.8 10*3/uL (ref 0.7–4.0)
MCH: 32.2 pg (ref 26.0–34.0)
MCHC: 33.2 g/dL (ref 30.0–36.0)
MCV: 97 fL (ref 80.0–100.0)
Monocytes Absolute: 0.4 10*3/uL (ref 0.1–1.0)
Monocytes Relative: 4 %
Neutro Abs: 8.4 10*3/uL — ABNORMAL HIGH (ref 1.7–7.7)
Neutrophils Relative %: 87 %
Platelets: 310 10*3/uL (ref 150–400)
RBC: 5.03 MIL/uL (ref 4.22–5.81)
RDW: 13.8 % (ref 11.5–15.5)
WBC: 9.7 10*3/uL (ref 4.0–10.5)
nRBC: 0 % (ref 0.0–0.2)

## 2019-06-06 LAB — TROPONIN I (HIGH SENSITIVITY)
Troponin I (High Sensitivity): 4 ng/L (ref <18)
Troponin I (High Sensitivity): 3 ng/L (ref <18)

## 2019-06-06 LAB — D-DIMER, QUANTITATIVE: D-Dimer, Quant: 10.48 ug/mL-FEU — ABNORMAL HIGH (ref 0.00–0.50)

## 2019-06-06 IMAGING — CT CT ANGIO CHEST
2 of 7 series · 18 of 46 positions shown · IV contrast (Omnipaque or Isovue)
Comparison: None.

CLINICAL DATA: Shortness of breath, COVID positive

EXAM:
CT ANGIOGRAPHY CHEST WITH CONTRAST
TECHNIQUE: Multidetector CT imaging of the chest was performed using the
standard protocol during bolus administration of intravenous
contrast. Multiplanar CT image reconstructions and MIPs were
obtained to evaluate the vascular anatomy.
CONTRAST:  100mL OMNIPAQUE IOHEXOL 350 MG/ML SOLN

[Series 5: pe axial thins · axial · 0.71mm/px · z∈[-318,-45]mm · 15 of 299 slices shown]
[im 13/299  lung]
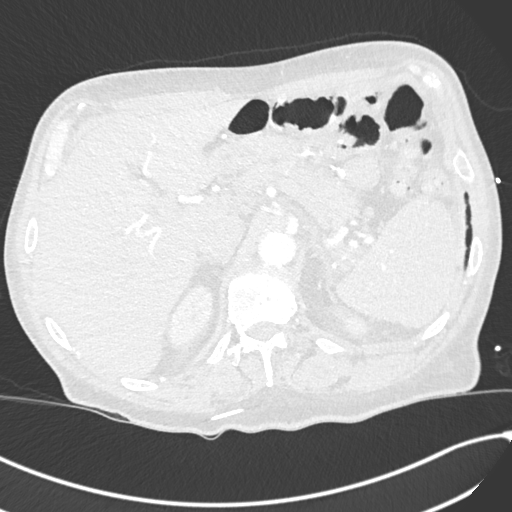
[im 39/299  soft-tissue]
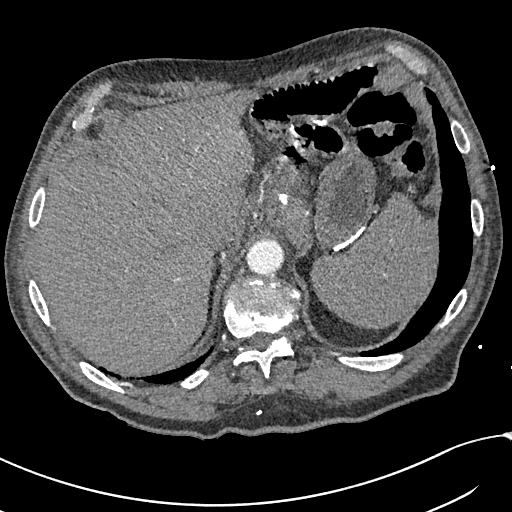
[im 52/299  lung]
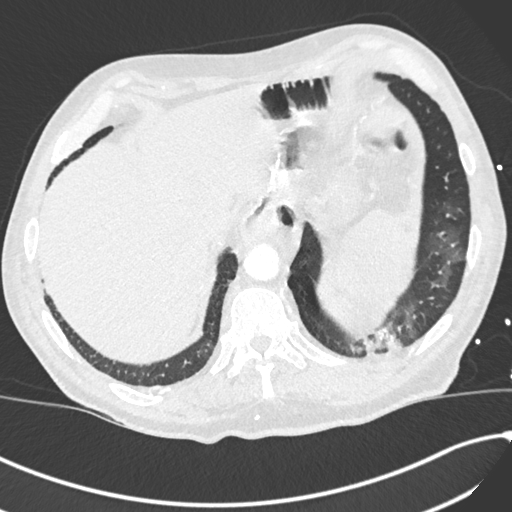
[im 78/299  soft-tissue]
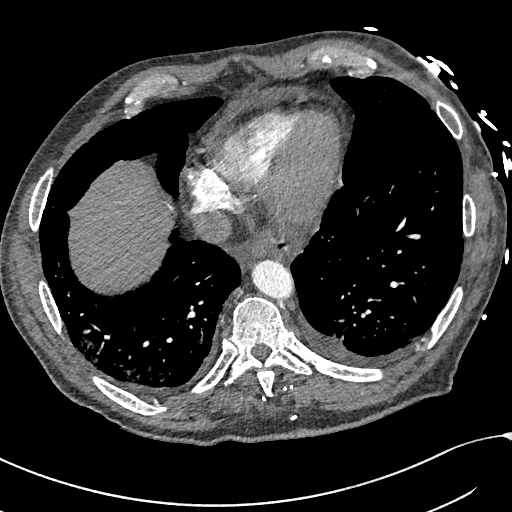
[im 91/299  lung]
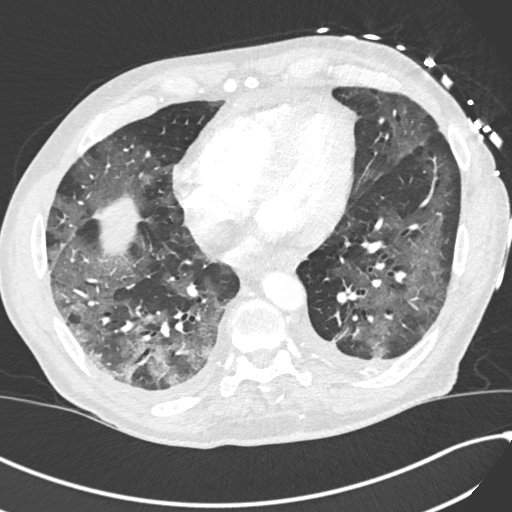
[im 117/299  soft-tissue]
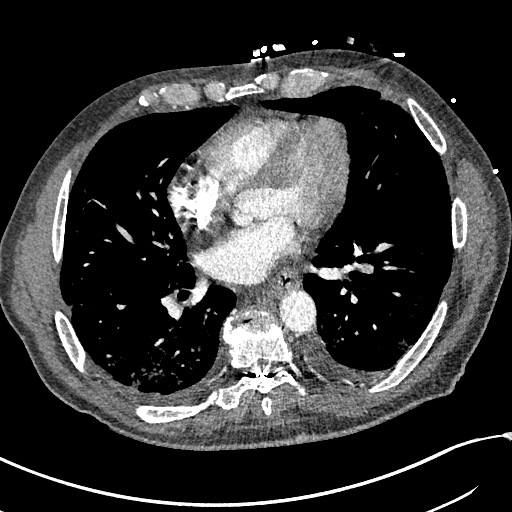
[im 130/299  lung]
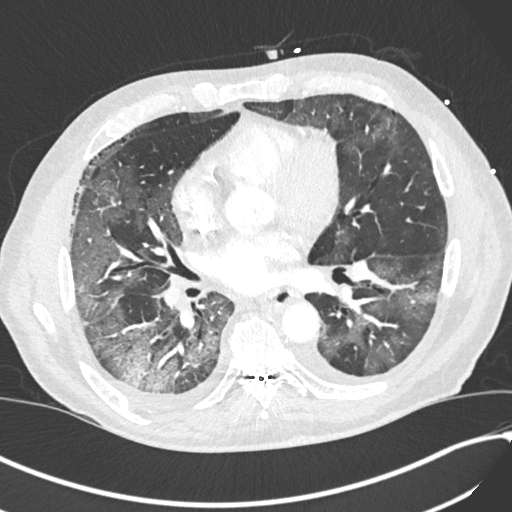
[im 156/299  soft-tissue]
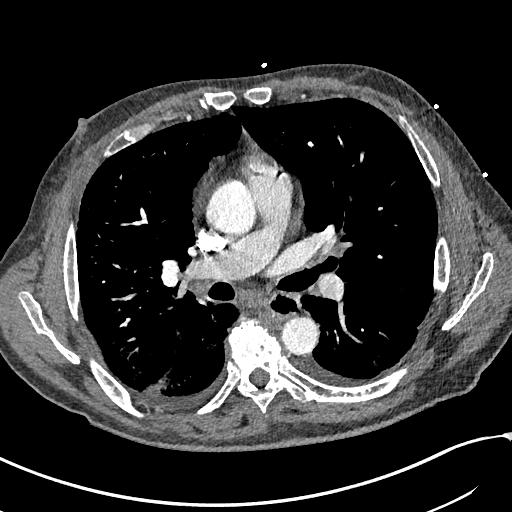
[im 169/299  lung]
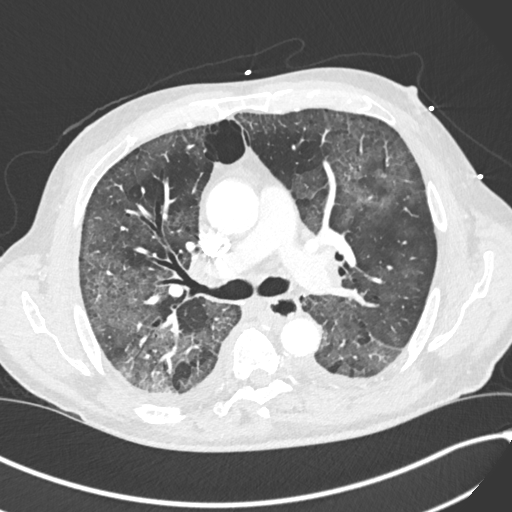
[im 182/299  soft-tissue]
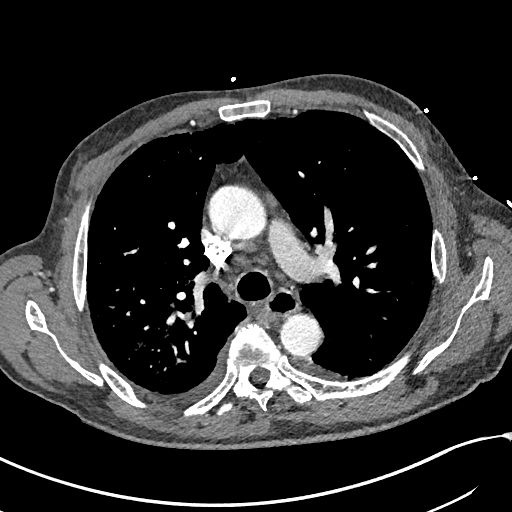
[im 208/299  lung]
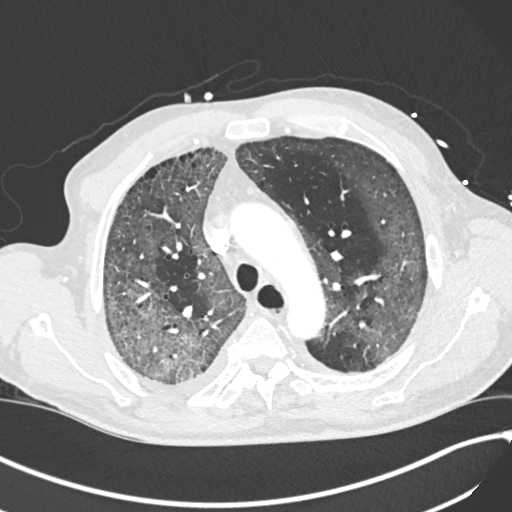
[im 221/299  soft-tissue]
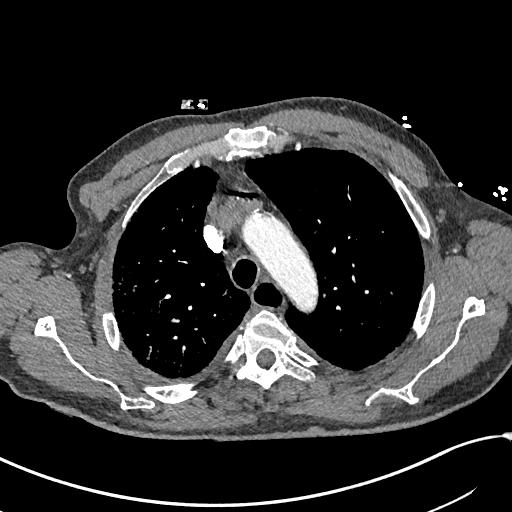
[im 247/299  lung]
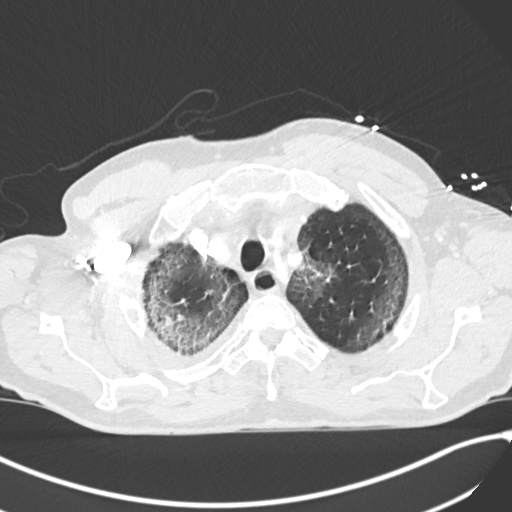
[im 260/299  soft-tissue]
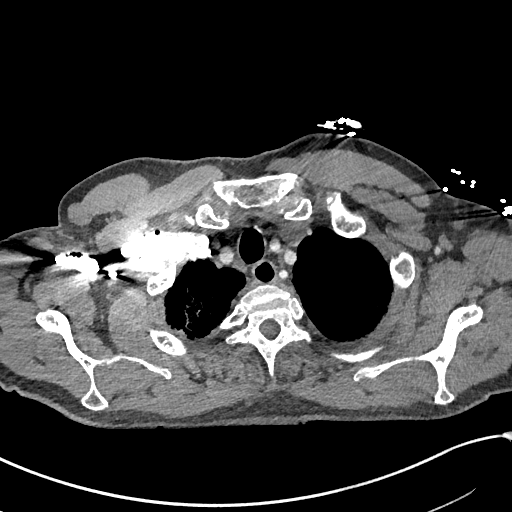
[im 286/299  lung]
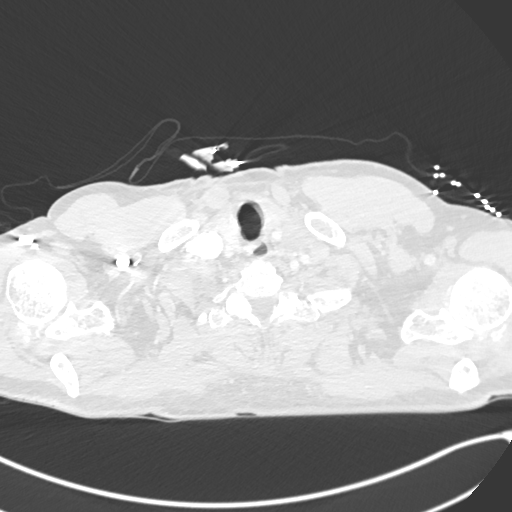

[Series 7: cor soft · coronal · 0.62mm/px · 3 of 151 slices shown]
[im 38/151  soft-tissue]
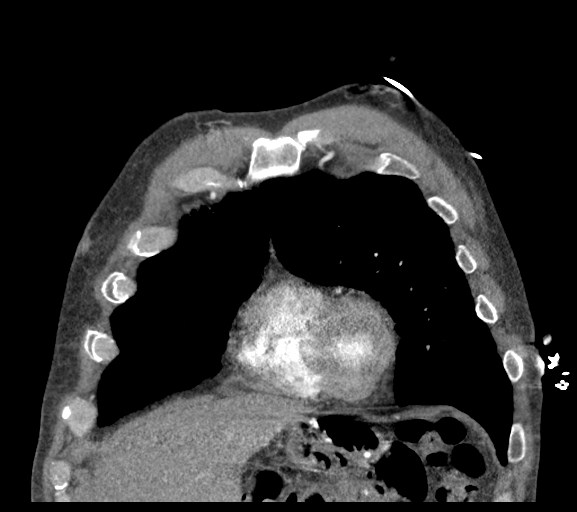
[im 76/151  soft-tissue]
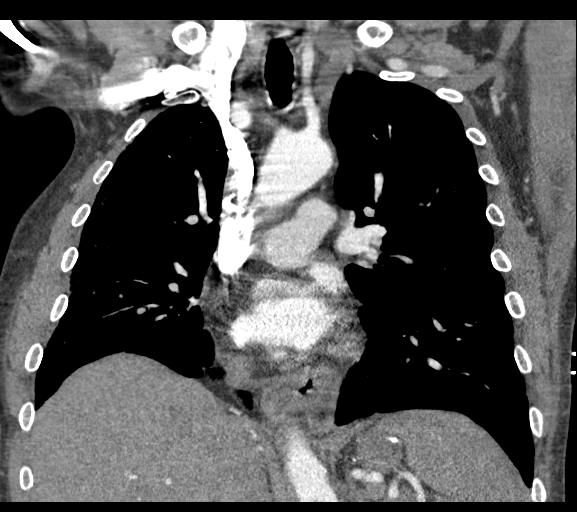
[im 113/151  soft-tissue]
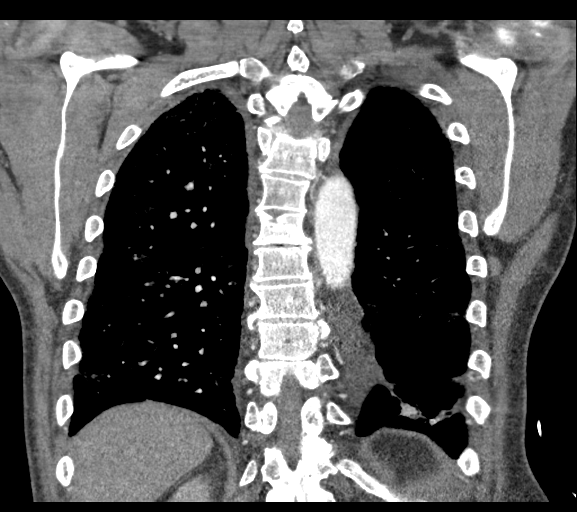

[18 of 46 positions shown; findings below may reference images not displayed]

FINDINGS: Cardiovascular: There is slightly suboptimal opacification of the
main pulmonary artery. No central or segmental pulmonary filling
defects are seen. There is limited visualization of the subsegmental
arterial branches. The heart is normal in size. There is a small
pericardial effusion present. No evidence right heart strain. There
is normal three-vessel brachiocephalic anatomy without proximal
stenosis. The thoracic aorta is normal in appearance. Coronary
artery calcifications are seen. Mild scattered aortic
atherosclerosis is noted.

Mediastinum/Nodes: No hilar, mediastinal, or axillary adenopathy.
Thyroid gland, trachea, and esophagus demonstrate no significant
findings.

Lungs/Pleura: Extensive multifocal ground-glass predominantly
peripherally based airspace opacities are seen throughout both
lungs. There are trace bilateral pleural effusions, left greater
than right.

Upper Abdomen: A small paraesophageal hernia is noted. The patient
has had a prior gastric bypass.

Musculoskeletal: No chest wall abnormality. No acute or significant
osseous findings. Degenerative changes are seen throughout the
thoracic spine. Overlying spinal stimulators seen from prior exam.

Review of the MIP images confirms the above findings.
IMPRESSION: 1. Slightly suboptimal opacification of the main pulmonary artery.
No central or large segmental pulmonary embolism seen.
2. Extensive multifocal ground-glass opacities throughout both
lungs, consistent with multifocal pneumonia.
3. Small bilateral pleural effusions, left greater than right.
4. Small pericardial effusion

## 2019-06-06 IMAGING — DX DG CHEST 1V PORT
1 series · 1 of 1 positions shown · non-contrast
Comparison: [DATE] from [REDACTED]

CLINICAL DATA: Chest pain. Diagnosed with [SM] 10 days ago.
Known pneumonia.

EXAM:
PORTABLE CHEST 1 VIEW

[chest ap]
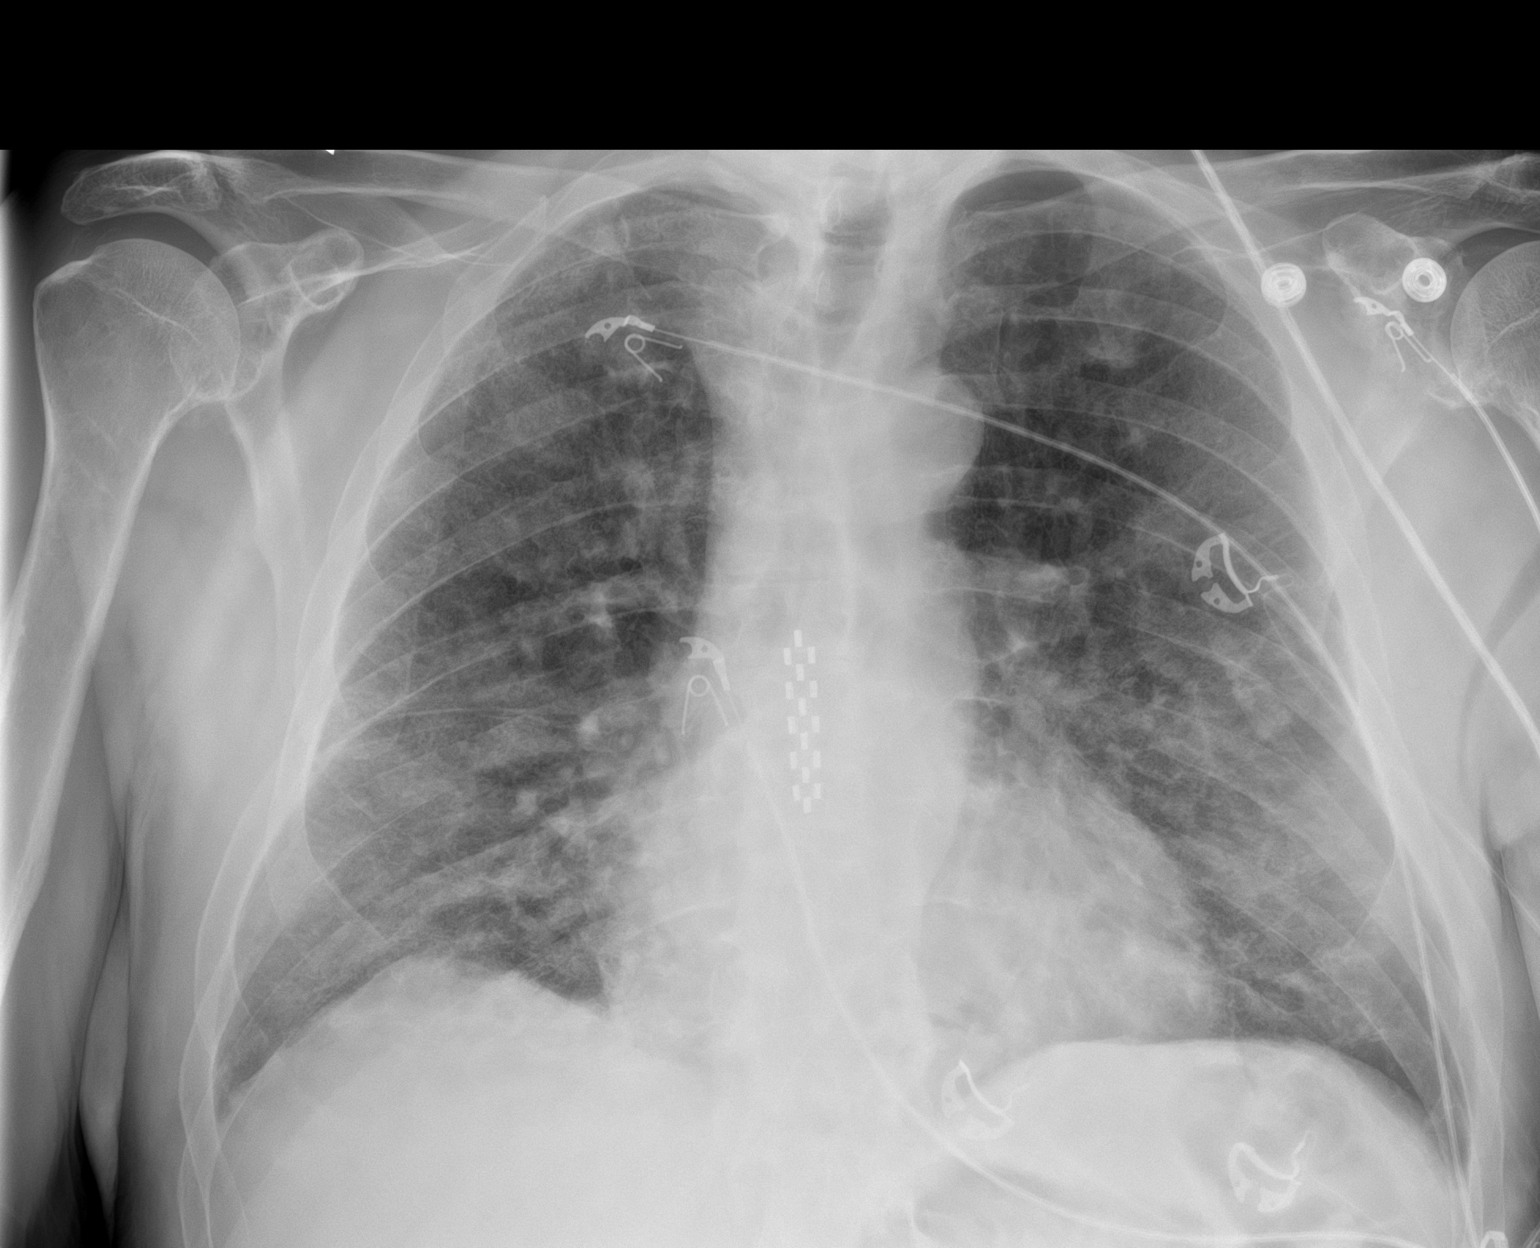

[1 of 1 positions shown; findings below may reference images not displayed]

FINDINGS: Dorsal spinal stimulator. Numerous leads and wires project over the
chest. Midline trachea. Normal heart size. Tortuous thoracic aorta.
No pleural effusion or pneumothorax. Relatively diffuse but slightly
lower lung predominant interstitial thickening with relative sparing
of the left upper lung. No well-defined lobar consolidation.
IMPRESSION: Development of relatively diffuse interstitial thickening since the
plain film of [DATE]. Given the clinical history, suspicious for
multifocal [SM] pneumonia.

## 2019-06-06 MED ORDER — BENZONATATE 100 MG PO CAPS: 100.0000 mg | ORAL_CAPSULE | Freq: Three times a day (TID) | ORAL | 0 refills | Status: DC

## 2019-06-06 MED ORDER — SODIUM CHLORIDE 0.9 % IV BOLUS
500.0000 mL | Freq: Once | INTRAVENOUS | Status: AC
  Administered 2019-06-06: 500 mL via INTRAVENOUS

## 2019-06-06 MED ORDER — AEROCHAMBER PLUS FLO-VU MEDIUM MISC
1.0000 | Freq: Once | Status: AC
  Administered 2019-06-06: 16:00:00 1
  Filled 2019-06-06: qty 1

## 2019-06-06 MED ORDER — ALBUTEROL SULFATE HFA 108 (90 BASE) MCG/ACT IN AERS
4.0000 | INHALATION_SPRAY | Freq: Once | RESPIRATORY_TRACT | Status: AC
  Administered 2019-06-06: 4 via RESPIRATORY_TRACT
  Filled 2019-06-06: qty 6.7

## 2019-06-06 MED ORDER — HYDROCODONE-ACETAMINOPHEN 5-325 MG PO TABS
1.0000 | ORAL_TABLET | Freq: Once | ORAL | Status: AC
  Administered 2019-06-06: 1 via ORAL
  Filled 2019-06-06: qty 1

## 2019-06-06 MED ORDER — IOHEXOL 350 MG/ML SOLN
100.0000 mL | Freq: Once | INTRAVENOUS | Status: AC | PRN
  Administered 2019-06-06: 100 mL via INTRAVENOUS

## 2019-06-06 NOTE — ED Provider Notes (Signed)
[facility] EMERGENCY DEPARTMENT Provider Note   CSN: [id number] Arrival date & time: 06/06/19  1443     History Chief Complaint  Patient presents with  . Pneumonia    Antonio Lucas is a 64 y.o. male with a history of hypertension, hyperlipidemia, obesity, prior GI bleed, and GERD who is known COVID-19 positive as of 05/26/19 which is when sxs started who presents to the ED for dyspnea and concern for hypoxia at home. Patient states he has felt poorly with fever to 103, chills, generalized achiness, dry cough, dyspnea with exertion, and chest pain(intermittent, worse with cough/deep breath) since 05/26/19. Seen at urgent care same day of onset, CXR with findings of pneumonia, influenza negative, covid positive- sent home with Levaquin and prednisone taper each of which he is still taking. States today the dyspnea seemed worse, occurring with talking and rest at times. No other alleviating/aggravating factors. Checked at home pulse oximeter and noted saturations to be in the 60s, he spoke with his primary care provider who recommended calling 911.  Has had somewhat of a poor appetite.  Does note trip to [place], 10 hours in the car, did stop for gas.  Denies syncope, leg pain/swelling, hemoptysis, recent surgery/trauma,  hormone use, personal hx of cancer, or hx of DVT/PE.      HPI     Past Medical History:  Diagnosis Date  . Arthritis   . COVID-19   . GERD (gastroesophageal reflux disease)   . Hyperlipemia   . Hypertension   . Pneumonia 1995    Patient Active Problem List   Diagnosis Date Noted  . Essential hypertension   . Unstable angina (HCC)   . Morbid obesity (HCC) 12/27/2011  . Edema 12/27/2011  . GERD (gastroesophageal reflux disease) 12/23/2011  . GI bleed 12/23/2011  . Hypertension 12/23/2011  . High cholesterol 12/23/2011    Past Surgical History:  Procedure Laterality Date  . BACK SURGERY     02.2011- HE HAD SOME NECK PROBLEMS WITH C7 .  . CARDIAC  CATHETERIZATION N/A 12/04/2014   Procedure: Left Heart Cath and Coronary Angiography;  Surgeon: Lennette Bihari, MD;  Location: [facility] INVASIVE CV LAB;  Service: Cardiovascular;  Laterality: N/A;  . CARPAL TUNNEL RELEASE     both arms  . COLONOSCOPY  02/16/2012   Procedure: COLONOSCOPY;  Surgeon: Malissa Hippo, MD;  Location: [facility] ENDO SUITE;  Service: Endoscopy;  Laterality: N/A;  730  . ESOPHAGOGASTRODUODENOSCOPY N/A 12/14/2013   Procedure: ESOPHAGOGASTRODUODENOSCOPY (EGD);  Surgeon: Malissa Hippo, MD;  Location: [facility] ENDO SUITE;  Service: Endoscopy;  Laterality: N/A;  1030  . ESOPHAGOGASTRODUODENOSCOPY (EGD) WITH ESOPHAGEAL DILATION  01/07/2012   Procedure: ESOPHAGOGASTRODUODENOSCOPY (EGD) WITH ESOPHAGEAL DILATION;  Surgeon: Malissa Hippo, MD;  Location: [facility] ENDO SUITE;  Service: Endoscopy;  Laterality: N/A;  145  . MALONEY DILATION N/A 12/14/2013   Procedure: Elease Hashimoto DILATION;  Surgeon: Malissa Hippo, MD;  Location: [facility] ENDO SUITE;  Service: Endoscopy;  Laterality: N/A;  . TONSILLECTOMY         Family History  Problem Relation Age of Onset  . Heart attack Father     Social History   Tobacco Use  . Smoking status: Former Smoker    Packs/day: 2.00    Years: 40.00    Pack years: 80.00    Types: Cigarettes    Start date: 03/03/1962    Quit date: 03/04/2007    Years since quitting: 12.2  . Smokeless tobacco: Never Used  Substance Use  Topics  . Alcohol use: Yes    Alcohol/week: 0.0 standard drinks    Comment: glass of wine daily  . Drug use: No    Home Medications Prior to Admission medications   Medication Sig Start Date End Date Taking? Authorizing Provider  amoxicillin-clavulanate (AUGMENTIN) 875-125 MG tablet Take 1 tablet by mouth every 12 (twelve) hours. 02/10/18   Chrissy Ealey R, PA-C  atorvastatin (LIPITOR) 10 MG tablet Take 10 mg by mouth daily.  01/19/18   [provider]  atorvastatin (LIPITOR) 40 MG tablet TAKE 1 TABLET EVERY DAY 10/17/13   Laurey MoraleMcLean,  Dalton S, MD  furosemide (LASIX) 20 MG tablet Take 40 mg by mouth daily.     [provider]  lamoTRIgine (LAMICTAL) 100 MG tablet Take 100 mg by mouth at bedtime. 05/10/15   [provider]  LATUDA 20 MG TABS tablet Take 1 tablet by mouth daily. 06/04/15   [provider]  nebivolol (BYSTOLIC) 10 MG tablet Take 10 mg by mouth daily.    [provider]  nitroGLYCERIN (NITROSTAT) 0.4 MG SL tablet Place 1 tablet (0.4 mg total) under the tongue every 5 (five) minutes as needed for chest pain. 11/30/14   Jodelle GrossLawrence, Kathryn M, NP  omeprazole (PRILOSEC) 40 MG capsule Take 40 mg by mouth 2 (two) times daily.    [provider]  phentermine (ADIPEX-P) 37.5 MG tablet Take 0.5 tablets by mouth daily.  04/20/15   [provider]  tadalafil (CIALIS) 5 MG tablet Take 5 mg by mouth daily.    [provider]  testosterone cypionate (DEPOTESTOTERONE CYPIONATE) 200 MG/ML injection Inject 0.5 mLs into the muscle every 14 (fourteen) days.  11/17/11   [provider]  TRINTELLIX 5 MG TABS Take 1 tablet by mouth daily. 06/05/15   [provider]  valsartan (DIOVAN) 80 MG tablet TAKE 1 TABLET (80 MG TOTAL) BY MOUTH DAILY. 04/11/16   Laqueta LindenKoneswaran, Suresh A, MD  Vitamin D, Ergocalciferol, (DRISDOL) 50000 UNITS CAPS Take 1 capsule by mouth Once a week. sunday 12/05/11   [provider]  zolmitriptan (ZOMIG) 5 MG tablet Take 1 tablet by mouth every 2 (two) hours as needed for migraine.  10/29/12   [provider]    Allergies    Imdur [isosorbide nitrate] and Fentanyl  Review of Systems   Review of Systems  Constitutional: Positive for chills, fatigue and fever.  Respiratory: Positive for cough and shortness of breath.   Cardiovascular: Positive for chest pain. Negative for leg swelling.  Gastrointestinal: Negative for anal bleeding, blood in stool and vomiting.  Genitourinary: Negative for dysuria.  Musculoskeletal: Positive for  myalgias.  Neurological: Negative for syncope.  All other systems reviewed and are negative.   Physical Exam Updated Vital Signs BP 117/67 (BP Location: Left Arm)   Pulse 70   Temp 98 F (36.7 C) (Oral)   Resp 20   Ht 5\' 7"  (1.702 m)   Wt 78 kg   SpO2 (!) 80%   BMI 26.94 kg/m   Physical Exam Vitals and nursing note reviewed.  Constitutional:      General: He is not in acute distress.    Appearance: He is well-developed. He is not toxic-appearing.  HENT:     Head: Normocephalic and atraumatic.     Mouth/Throat:     Mouth: Mucous membranes are dry.  Eyes:     General:        Right eye: No discharge.  Left eye: No discharge.     Conjunctiva/sclera: Conjunctivae normal.  Cardiovascular:     Rate and Rhythm: Normal rate and regular rhythm.  Pulmonary:     Effort: Tachypnea (intermittently ) present. No respiratory distress.     Breath sounds: Normal breath sounds. No wheezing, rhonchi or rales.     Comments: Patient was placed on 2 L of oxygen via nasal cannula by nursing staff, this was turned off on initial assessment, maintaining SPO2 greater than 95% on room air at rest therefore will leave oxygen off at this time. Chest:     Chest wall: Tenderness (Anterior chest wall.) present. No crepitus.  Abdominal:     General: There is no distension.     Palpations: Abdomen is soft.     Tenderness: There is no abdominal tenderness. There is no guarding or rebound.  Musculoskeletal:     Cervical back: Neck supple.     Right lower leg: No edema.     Left lower leg: No edema.  Skin:    General: Skin is warm and dry.     Findings: No rash.  Neurological:     Mental Status: He is alert.     Comments: Clear speech.   Psychiatric:        Behavior: Behavior normal.     ED Results / Procedures / Treatments   Labs (all labs ordered are listed, but only abnormal results are displayed) Labs Reviewed  COMPREHENSIVE METABOLIC PANEL - Abnormal; Notable for the following  components:      Result Value   Glucose, Bld 135 (*)    BUN 36 (*)    Albumin 3.0 (*)    AST 47 (*)    All other components within normal limits  CBC WITH DIFFERENTIAL/PLATELET - Abnormal; Notable for the following components:   Neutro Abs 8.4 (*)    All other components within normal limits  D-DIMER, QUANTITATIVE (NOT AT [facility]) - Abnormal; Notable for the following components:   D-Dimer, Quant 10.48 (*)    All other components within normal limits  TROPONIN I (HIGH SENSITIVITY)  TROPONIN I (HIGH SENSITIVITY)    EKG None  Radiology CT Angio Chest PE W/Cm &/Or Wo Cm  Result Date: 06/06/2019 CLINICAL DATA:  Shortness of breath, COVID positive EXAM: CT ANGIOGRAPHY CHEST WITH CONTRAST TECHNIQUE: Multidetector CT imaging of the chest was performed using the standard protocol during bolus administration of intravenous contrast. Multiplanar CT image reconstructions and MIPs were obtained to evaluate the vascular anatomy. CONTRAST:  OMNIPAQUE IOHEXOL 350 MG/ML SOLN COMPARISON:  None. FINDINGS: Cardiovascular: There is slightly suboptimal opacification of the main pulmonary artery. No central or segmental pulmonary filling defects are seen. There is limited visualization of the subsegmental arterial branches. The heart is normal in size. There is a small pericardial effusion present. No evidence right heart strain. There is normal three-vessel brachiocephalic anatomy without proximal stenosis. The thoracic aorta is normal in appearance. Coronary artery calcifications are seen. Mild scattered aortic atherosclerosis is noted. Mediastinum/Nodes: No hilar, mediastinal, or axillary adenopathy. Thyroid gland, trachea, and esophagus demonstrate no significant findings. Lungs/Pleura: Extensive multifocal ground-glass predominantly peripherally based airspace opacities are seen throughout both lungs. There are trace bilateral pleural effusions, left greater than right. Upper Abdomen: A small paraesophageal  hernia is noted. The patient has had a prior gastric bypass. Musculoskeletal: No chest wall abnormality. No acute or significant osseous findings. Degenerative changes are seen throughout the thoracic spine. Overlying spinal stimulators seen from prior exam. Review  of the MIP images confirms the above findings. IMPRESSION: 1. Slightly suboptimal opacification of the main pulmonary artery. No central or large segmental pulmonary embolism seen. 2. Extensive multifocal ground-glass opacities throughout both lungs, consistent with multifocal pneumonia. 3. Small bilateral pleural effusions, left greater than right. 4. Small pericardial effusion Electronically Signed   By: Prudencio Pair M.D.   On: 06/06/2019 16:48   DG Chest Portable 1 View  Result Date: 06/06/2019 CLINICAL DATA:  Chest pain. Diagnosed with COVID-19 10 days ago. Known pneumonia. EXAM: PORTABLE CHEST 1 VIEW [facility]  [facility] FINDINGS: Dorsal spinal stimulator. Numerous leads and wires project over the chest. Midline trachea. Normal heart size. Tortuous thoracic aorta. No pleural effusion or pneumothorax. Relatively diffuse but slightly lower lung predominant interstitial thickening with relative sparing of the left upper lung. No well-defined lobar consolidation. IMPRESSION: Development of relatively diffuse interstitial thickening since the plain film of 05/26/2019. Given the clinical history, suspicious for multifocal COVID-19 pneumonia. Electronically Signed   By: Abigail Miyamoto M.D.   On: 06/06/2019 16:00    Procedures Procedures (including critical care time)  Medications Ordered in ED Medications  HYDROcodone-acetaminophen (NORCO/VICODIN) 5-325 MG per tablet 1 tablet (has no administration in time range)  albuterol (VENTOLIN HFA) 108 (90 Base) MCG/ACT inhaler 4 puff (4 puffs Inhalation Given 06/06/19 1542)  AeroChamber Plus Flo-Vu Medium MISC 1 each (1 each Other Given 06/06/19 1542)  iohexol (OMNIPAQUE) 350 MG/ML  injection 100 mL (100 mLs Intravenous Contrast Given 06/06/19 1627)  sodium chloride 0.9 % bolus 500 mL (0 mLs Intravenous Stopped 06/06/19 1845)    ED Course  I have reviewed the triage vital signs and the nursing notes.  Pertinent labs & imaging results that were available during my care of the patient were reviewed by me and considered in my medical decision making (see chart for details).    Antonio Lucas was evaluated in Emergency Department on 06/06/2019 for the symptoms described in the history of present illness. He/she was evaluated in the context of the global COVID-19 pandemic, which necessitated consideration that the patient might be at risk for infection with the SARS-CoV-2 virus that causes COVID-19. Institutional protocols and algorithms that pertain to the evaluation of patients at risk for COVID-19 are in a state of rapid change based on information released by regulatory bodies including the CDC and federal and state organizations. These policies and algorithms were followed during the patient's care in the ED.  MDM Rules/Calculators/A&P                      Patient known covid positive presents to the ED with worsened dyspnea today and concern for hypoxia on at home pulse oximeter.  Upon emergency department arrival nursing staff states that his fingers were cold, trouble getting accurate SPO2, monitor placed on forehead with improvement in Pleth, nursing titrated down to 2L via [place], which I subsequently removed with patient maintaining his SpO2 > 95%. Additional vitals otherwise fairly unremarkable, he does become intermittently tachypneic. Lungs are clear. DDX: Covid related dyspnea, pulmonary embolism, pneumothorax, ACS, CHF. EKG; no STEMI.   Additional history obtained:  Previous records obtained and reviewed- positive covid testing, negative influenza.   Lab Tests:  I Ordered, reviewed, and interpreted labs, which included:  CBC: no anemia/leukocytosis.  CMP: elevated BUN  raising concern for dehydration- discussed with Dr. Roderic Palau recommends small fluid bolus.  Troponin: 4, 3 flat  D-dimer: Elevated--> CTA ordered.   Imaging  Studies ordered:  I ordered imaging studies which included CXR and subsequently CTA, I independently visualized and interpreted imaging which showed findings of multifocal covid 19 pneumonia, no obvious PE- slightly suboptimal study, small bilateral pleural effusions & small pericardial effusion.   Medicines ordered:  I ordered medication albuterol inhaler for dyspnea.  I ordered 500 cc fluid bolus per discussion with Dr. Estell Harpin for dehydration, avoid aggressive fluids in COVID 19 patient.   I ordered hydrocodone/tylenol as patient is due for his regularly prescribed pain medication that he takes at home.   Reevaluation: After the interventions stated above, I reevaluated the patient and found that he remains without appearance of respiratory distress, SpO2 >95% on RA @ rest, I personally ambulated patient on my initial assessment and nursing staff ambulated following ED interventions- able to maintain SpO2 >95% with each ambulation attempt with RR 18-26. CTA negative for PE. Does have small pleural/pericardia effusions which will need follow up. Troponins flat, no STEMI, doubt ACS. No peripheral edema or pulmonary edema on CXR, do not suspect acute CHF. Patient able to maintain SpO2 with ambulation and mild tachypnea, does not appear in respiratory distress. Findings and plan of care discussed with supervising physician Dr. Estell Harpin, including H&P/imaging/labs, in agreement with discharge home with close PCP follow up and strict return precautions. I discussed results, treatment plan, need for follow-up, and return precautions with the patient. Provided opportunity for questions, patient confirmed understanding and is in agreement with plan.   This is a shared visit with supervising physician Dr. Estell Harpin who has independently evaluated patient &  provided guidance in evaluation/management/disposition, in agreement with care   Portions of this note were generated with Dragon dictation software. Dictation errors may occur despite best attempts at proofreading.  Blood pressure 132/81, pulse 87, temperature 98 F (36.7 C), temperature source Oral, resp. rate 20, height 5\' 7"  (1.702 m), weight 78 kg, SpO2 95 %.  Final Clinical Impression(s) / ED Diagnoses Final diagnoses:  COVID-19    Rx / DC Orders ED Discharge Orders         Ordered    benzonatate (TESSALON) 100 MG capsule  Every 8 hours     06/06/19 1854           08/06/19, PA-C 06/06/19 1857    08/06/19, MD 06/08/19 1155

## 2019-06-06 NOTE — ED Notes (Signed)
Pt wife Marylene Land given update.

## 2019-06-06 NOTE — Discharge Instructions (Addendum)
You were seen in the emergency department today for trouble breathing.  In the emergency department your oxygen saturations have remained normal which is reassuring.  Your labs show that you are a bit dehydrated therefore we gave you fluids in the ER.  Your heart enzymes and EKG did not show signs of a heart attack.  Your CT scan did not show a blood clot, it did show signs of pneumonia consistent with COVID-19 as well as some fluid at the base of your lungs and a small amount around your heart, please have these rechecked by your primary care provider.  Please continue your Levaquin and prednisone prescribed by urgent care.  Please use the albuterol inhaler provided 1 to 2 puffs every 4-6 hours as needed for shortness of breath/wheezing.  Please also take Tessalon every 8 hours as needed for coughing.  We have prescribed you new medication(s) today. Discuss the medications prescribed today with your pharmacist as they can have adverse effects and interactions with your other medicines including over the counter and prescribed medications. Seek medical evaluation if you start to experience new or abnormal symptoms after taking one of these medicines, seek care immediately if you start to experience difficulty breathing, feeling of your throat closing, facial swelling, or rash as these could be indications of a more serious allergic reaction  Continue to isolate at per [facility] guidelines.  Please follow-up with your primary care provider within 3 days for reevaluation of your symptoms, please make sure they are aware of your symptoms and COVID-19 positive status.  Return to the ER immediately for new or worsening symptoms including but not limited to worsening shortness of breath, increased pain, passing out, coughing up blood, leg swelling, or any other concerns.

## 2019-06-06 NOTE — ED Triage Notes (Signed)
PT brought in by [facility] and states got back from [place], [place] on 05/21/2019 and was diagnosed with with Covid on 05/26/2019 with double pneumonia and was started on Levaquin and prednisone. PT states worsening in SOB over the past few days and fever of 103. PT states he took tylenol at 1330 today.

## 2019-06-06 NOTE — ED Notes (Signed)
Pt ambulated in room, Pt O2 sat remained 95-100% during ambulation, RR 18-26 with ambulation. Pt ambulated with room air.

## 2019-06-13 MED ORDER — ALBUTEROL SULFATE HFA 108 (90 BASE) MCG/ACT IN AERS
2.00 | INHALATION_SPRAY | RESPIRATORY_TRACT | Status: DC
Start: ? — End: 2019-06-13

## 2019-06-13 MED ORDER — FENTANYL CITRATE (PF) 2500 MCG/50ML IJ SOLN
100.00 | INTRAMUSCULAR | Status: DC
Start: ? — End: 2019-06-13

## 2019-06-13 MED ORDER — GENERIC EXTERNAL MEDICATION
0.08 | Status: DC
Start: ? — End: 2019-06-13

## 2019-06-13 MED ORDER — BENZONATATE 100 MG PO CAPS
100.00 | ORAL_CAPSULE | ORAL | Status: DC
Start: 2019-06-13 — End: 2019-06-13

## 2019-06-13 MED ORDER — PANTOPRAZOLE SODIUM 40 MG IV SOLR
40.00 | INTRAVENOUS | Status: DC
Start: 2019-06-14 — End: 2019-06-13

## 2019-06-13 MED ORDER — FENTANYL CITRATE (PF) 2500 MCG/50ML IJ SOLN
12.50 | INTRAMUSCULAR | Status: DC
Start: ? — End: 2019-06-13

## 2019-06-13 MED ORDER — GENERIC EXTERNAL MEDICATION
Status: DC
Start: ? — End: 2019-06-13

## 2019-06-13 MED ORDER — GENERIC EXTERNAL MEDICATION
0.04 | Status: DC
Start: ? — End: 2019-06-13

## 2019-06-13 MED ORDER — ACETAMINOPHEN 650 MG RE SUPP
650.00 | RECTAL | Status: DC
Start: ? — End: 2019-06-13

## 2019-06-13 MED ORDER — DEXAMETHASONE SODIUM PHOSPHATE 10 MG/ML IJ SOLN
6.00 | INTRAMUSCULAR | Status: DC
Start: 2019-06-14 — End: 2019-06-13

## 2019-06-13 MED ORDER — ACETAMINOPHEN 160 MG/5ML PO SUSP
650.00 | ORAL | Status: DC
Start: ? — End: 2019-06-13

## 2019-06-13 MED ORDER — ZINC SULFATE 220 (50 ZN) MG PO CAPS
220.00 | ORAL_CAPSULE | ORAL | Status: DC
Start: 2019-06-14 — End: 2019-06-13

## 2019-06-13 MED ORDER — FENTANYL CITRATE (PF) 2500 MCG/50ML IJ SOLN
50.00 | INTRAMUSCULAR | Status: DC
Start: ? — End: 2019-06-13

## 2019-06-13 MED ORDER — FISH OIL 1000 MG PO CAPS
2.00 | ORAL_CAPSULE | ORAL | Status: DC
Start: 2019-06-14 — End: 2019-06-13

## 2019-06-13 MED ORDER — CHOLECALCIFEROL 25 MCG (1000 UT) PO TABS
2000.00 | ORAL_TABLET | ORAL | Status: DC
Start: 2019-06-14 — End: 2019-06-13

## 2019-06-13 MED ORDER — CHLORHEXIDINE GLUCONATE 0.12 % MT SOLN
15.00 | OROMUCOSAL | Status: DC
Start: 2019-06-13 — End: 2019-06-13

## 2019-06-13 MED ORDER — K PHOS MONO-SOD PHOS DI & MONO 155-852-130 MG PO TABS
500.00 | ORAL_TABLET | ORAL | Status: DC
Start: 2019-06-13 — End: 2019-06-13

## 2019-06-13 MED ORDER — ACETAMINOPHEN 325 MG PO TABS
650.00 | ORAL_TABLET | ORAL | Status: DC
Start: ? — End: 2019-06-13

## 2019-06-13 MED ORDER — FENTANYL CITRATE (PF) 2500 MCG/50ML IJ SOLN
25.00 | INTRAMUSCULAR | Status: DC
Start: ? — End: 2019-06-13

## 2019-06-13 MED ORDER — ENOXAPARIN SODIUM 40 MG/0.4ML ~~LOC~~ SOLN
0.50 | SUBCUTANEOUS | Status: DC
Start: 2019-06-13 — End: 2019-06-13

## 2019-06-13 MED ORDER — LAMOTRIGINE 100 MG PO TABS
100.00 | ORAL_TABLET | ORAL | Status: DC
Start: 2019-06-13 — End: 2019-06-13

## 2019-06-13 MED ORDER — SODIUM CHLORIDE 0.9 % IV SOLN
10.00 | INTRAVENOUS | Status: DC
Start: ? — End: 2019-06-13

## 2020-01-31 ENCOUNTER — Other Ambulatory Visit (HOSPITAL_COMMUNITY): Payer: Self-pay | Admitting: Orthopaedic Surgery

## 2020-01-31 DIAGNOSIS — M4716 Other spondylosis with myelopathy, lumbar region: Secondary | ICD-10-CM

## 2020-02-14 ENCOUNTER — Ambulatory Visit (HOSPITAL_COMMUNITY)
Admission: RE | Admit: 2020-02-14 | Discharge: 2020-02-14 | Disposition: A | Discharge status: Home / Self Care | Payer: Medicare Other | Source: Ambulatory Visit | Attending: Orthopaedic Surgery | Admitting: Orthopaedic Surgery

## 2020-02-14 ENCOUNTER — Other Ambulatory Visit: Payer: Self-pay

## 2020-02-14 DIAGNOSIS — M4716 Other spondylosis with myelopathy, lumbar region: Secondary | ICD-10-CM | POA: Diagnosis present

## 2020-02-14 IMAGING — MR MR CERVICAL SPINE W/O CM
5 series · 33 of 48 positions shown · non-contrast
Comparison: CT myelogram [DATE].

CLINICAL DATA: Neck pain with bilateral arm numbness.

EXAM:
MRI CERVICAL SPINE WITHOUT CONTRAST
TECHNIQUE: Multiplanar, multisequence MR imaging of the cervical spine was
performed. No intravenous contrast was administered.

[Series 5: t2_tse_sag_fast · sagittal · 3.0mm · 0.43mm/px · 6 of 15 slices shown]
[im 1/15]
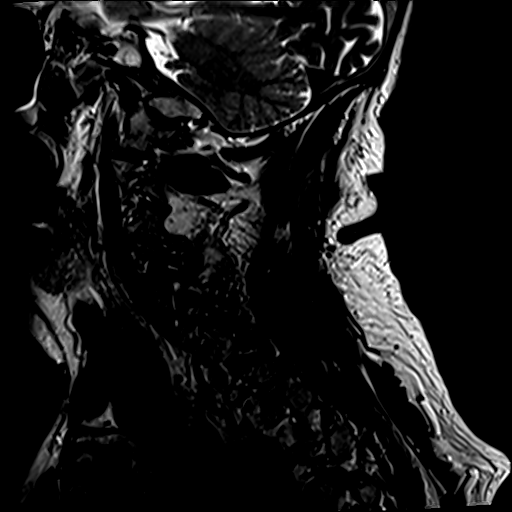
[im 3/15]
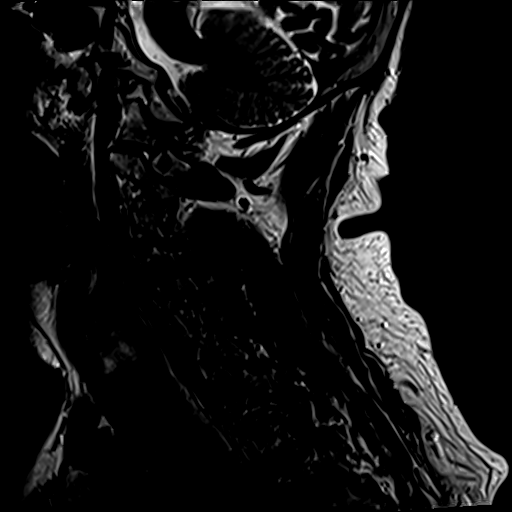
[im 6/15]
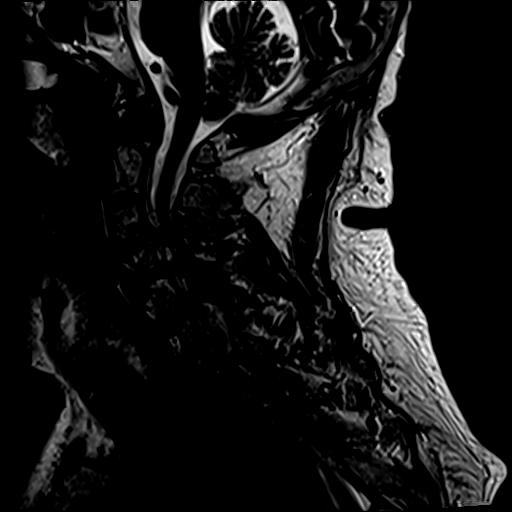
[im 9/15]
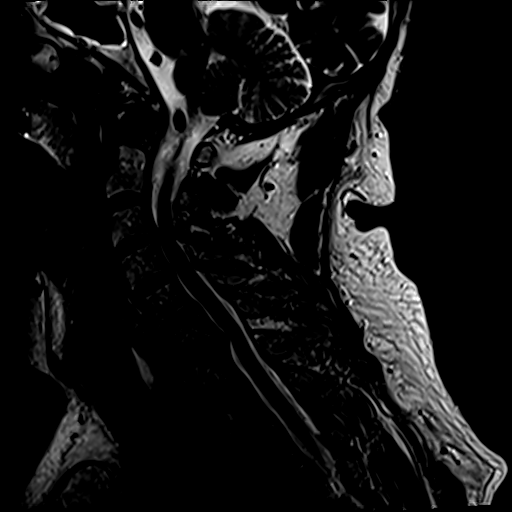
[im 12/15]
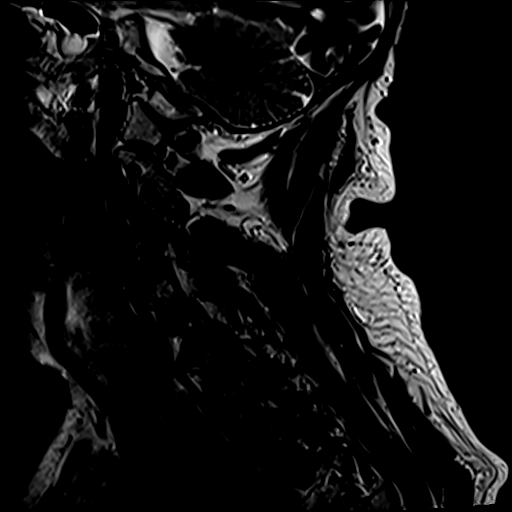
[im 15/15]
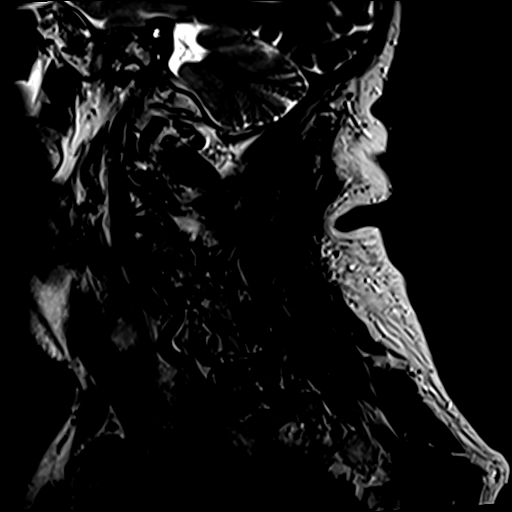

[Series 6: t1_tse_sag_fast · sagittal · 3.0mm · 0.43mm/px · 6 of 15 slices shown]
[im 1/15]
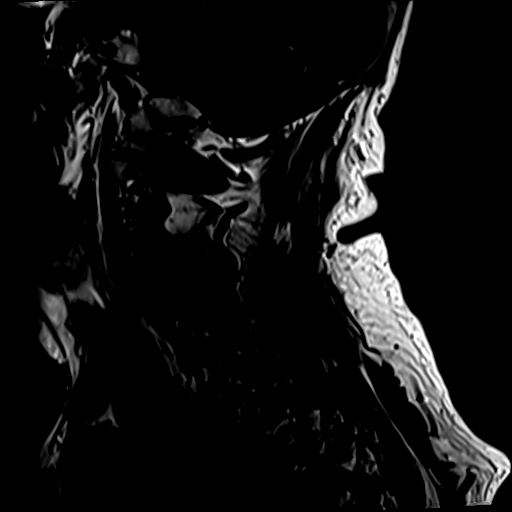
[im 3/15]
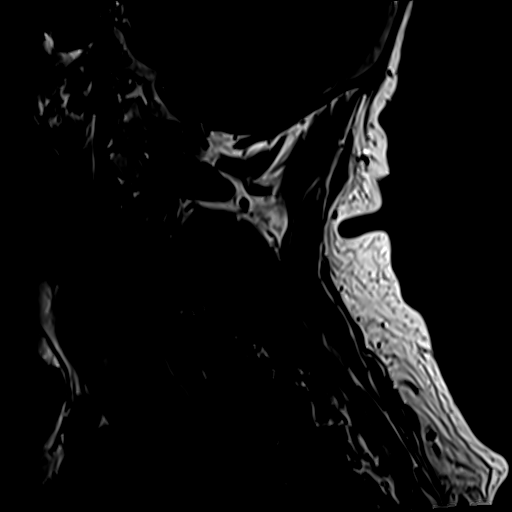
[im 6/15]
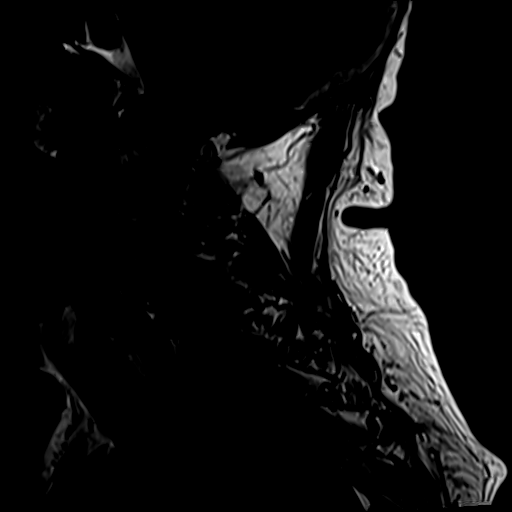
[im 9/15]
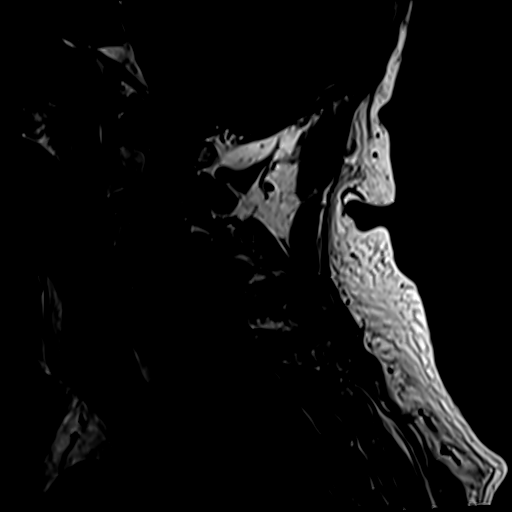
[im 12/15]
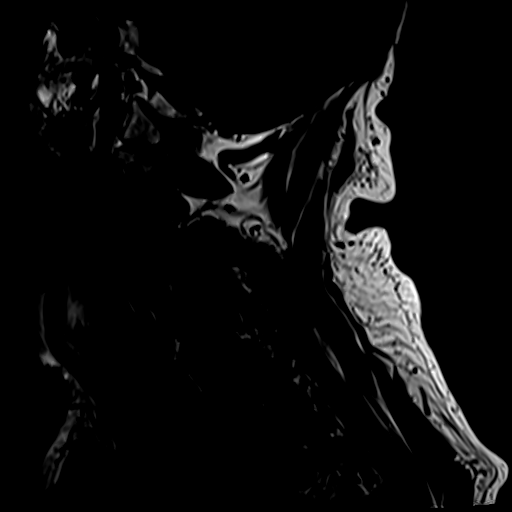
[im 15/15]
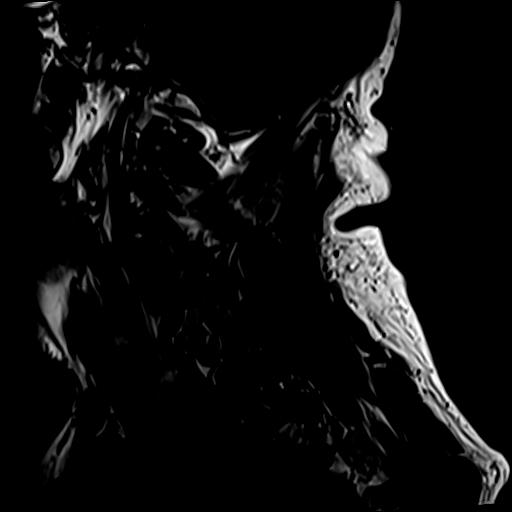

[Series 7: STIR · sagittal · 3.0mm · 0.86mm/px · 6 of 15 slices shown]
[im 1/15]
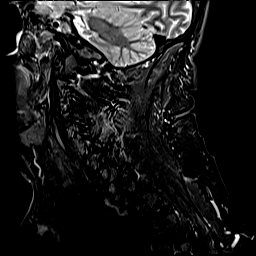
[im 3/15]
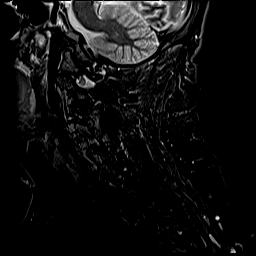
[im 6/15]
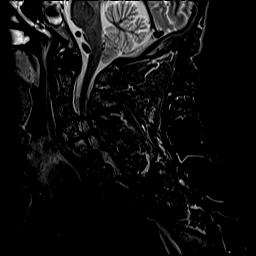
[im 9/15]
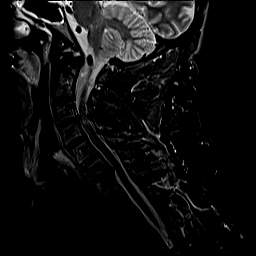
[im 12/15]
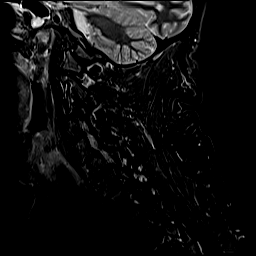
[im 15/15]
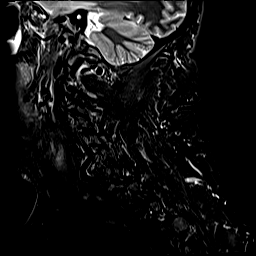

[Series 8: t2_tse_tra_fast · axial · 3.0mm · 0.78mm/px · z∈[-97,+24]mm · 9 of 40 slices shown]
[im 1/40]
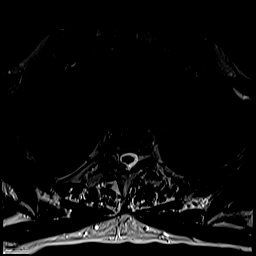
[im 6/40]
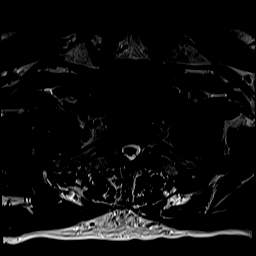
[im 12/40]
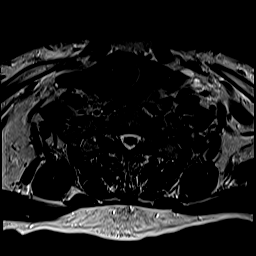
[im 17/40]
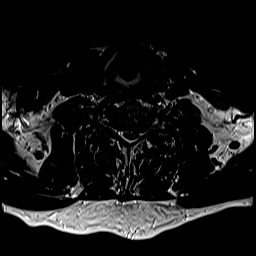
[im 20/40]
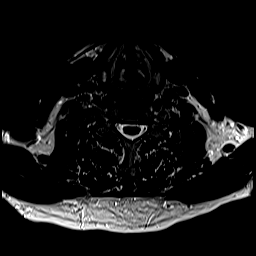
[im 23/40]
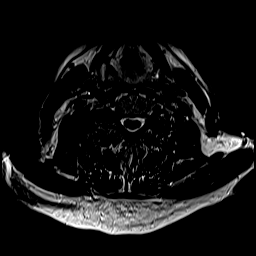
[im 28/40]
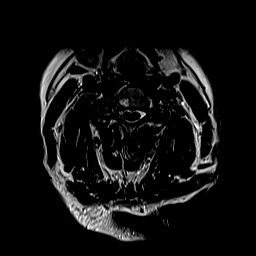
[im 34/40]
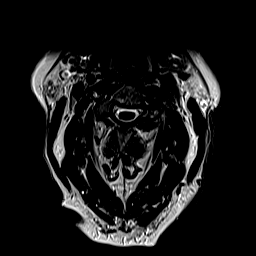
[im 40/40]
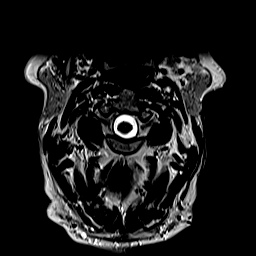

[Series 9: GRE · axial · 3.0mm · 0.78mm/px · z∈[-97,-13]mm · 6 of 40 slices shown]
[im 1/40]
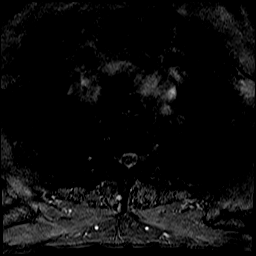
[im 6/40]
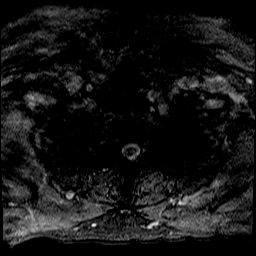
[im 12/40]
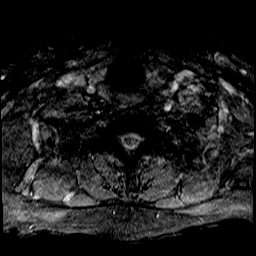
[im 17/40]
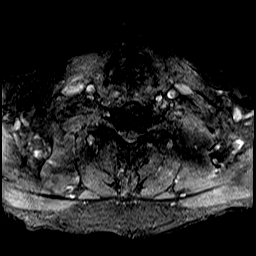
[im 23/40]
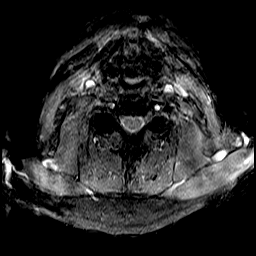
[im 28/40]
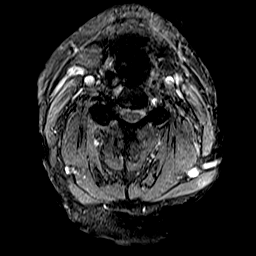

[33 of 48 positions shown; findings below may reference images not displayed]

FINDINGS: Alignment: Physiologic.

Vertebrae: No fracture, evidence of discitis, or bone lesion. Mild
degenerative disc disease about the C4-C5 disc. Otherwise, no focal
marrow signal abnormality to suggest acute fracture,
discitis/osteomyelitis, or suspicious bone lesion.

Cord: Faint T2/stir hyperintensity within the cord at C3-C4.

Posterior Fossa, vertebral arteries, paraspinal tissues: No acute
findings.

Disc levels:

C2-C3: Small left eccentric posterior disc osteophyte complex and
left facet and uncovertebral hypertrophy with mild left foraminal
stenosis. No significant canal stenosis.

C3-C4: Posterior disc osteophyte complex and bilateral facet and
uncovertebral hypertrophy. Resulting moderate to severe central
canal stenosis. Moderate to severe right and moderate left foraminal
stenosis.

C4-C5: Small posterior disc osteophyte complex. Severe right and
mild left facet and uncovertebral hypertrophy. Moderate right and
mild left foraminal stenosis. Mild central canal stenosis.

C5-C6: Small posterior disc osteophyte complex and mild bilateral
facet and uncovertebral hypertrophy. Mild bilateral foraminal
stenosis. No significant canal stenosis.

C6-C7: Disc height loss. Posterior disc osteophyte complex partially
effaces ventral CSF. Bilateral facet and uncovertebral hypertrophy.
Mild bilateral foraminal stenosis. Overall, no significant canal
stenosis.

C7-T1: Bilateral facet hypertrophy without significant canal or
foraminal stenosis.
IMPRESSION: 1. At C3-C4 there is moderate to severe canal stenosis. Faint cord
T2/stir hyperintensity at this level is concerning for cord edema
versus myelomalacia. There is also moderate to severe right and
moderate left foraminal stenosis at this level.
2. Moderate foraminal stenosis on the right at C4-C5 with bulky
right-sided facet hypertrophy. Mild foraminal stenosis on the left
at C4-C5 and bilaterally at C5-C6 and C6-C7.
3. Mild central canal stenosis at C4-C5. Posterior disc bulge at
C6-7 without significant canal stenosis.

## 2020-04-02 ENCOUNTER — Emergency Department (HOSPITAL_COMMUNITY): Payer: Medicare Other

## 2020-04-02 ENCOUNTER — Other Ambulatory Visit: Payer: Self-pay

## 2020-04-02 ENCOUNTER — Emergency Department (HOSPITAL_COMMUNITY)
Admission: EM | Admit: 2020-04-02 | Discharge: 2020-04-03 | Disposition: A | Discharge status: Home / Self Care | Payer: Medicare Other | Attending: Emergency Medicine | Admitting: Emergency Medicine

## 2020-04-02 DIAGNOSIS — K859 Acute pancreatitis without necrosis or infection, unspecified: Secondary | ICD-10-CM | POA: Insufficient documentation

## 2020-04-02 DIAGNOSIS — I10 Essential (primary) hypertension: Secondary | ICD-10-CM | POA: Diagnosis not present

## 2020-04-02 DIAGNOSIS — Z8616 Personal history of COVID-19: Secondary | ICD-10-CM | POA: Insufficient documentation

## 2020-04-02 DIAGNOSIS — Z79899 Other long term (current) drug therapy: Secondary | ICD-10-CM | POA: Insufficient documentation

## 2020-04-02 DIAGNOSIS — W1830XA Fall on same level, unspecified, initial encounter: Secondary | ICD-10-CM | POA: Insufficient documentation

## 2020-04-02 DIAGNOSIS — Z87891 Personal history of nicotine dependence: Secondary | ICD-10-CM | POA: Insufficient documentation

## 2020-04-02 DIAGNOSIS — S39012A Strain of muscle, fascia and tendon of lower back, initial encounter: Secondary | ICD-10-CM | POA: Diagnosis not present

## 2020-04-02 DIAGNOSIS — S345XXA Injury of lumbar, sacral and pelvic sympathetic nerves, initial encounter: Secondary | ICD-10-CM | POA: Diagnosis present

## 2020-04-02 LAB — COMPREHENSIVE METABOLIC PANEL
ALT: 17 U/L (ref 0–44)
AST: 29 U/L (ref 15–41)
Albumin: 4.1 g/dL (ref 3.5–5.0)
Alkaline Phosphatase: 72 U/L (ref 38–126)
Anion gap: 8 (ref 5–15)
BUN: 14 mg/dL (ref 8–23)
CO2: 27 mmol/L (ref 22–32)
Calcium: 9.1 mg/dL (ref 8.9–10.3)
Chloride: 99 mmol/L (ref 98–111)
Creatinine, Ser: 0.72 mg/dL (ref 0.61–1.24)
GFR, Estimated: >60 mL/min (ref >60)
Glucose, Bld: 131 mg/dL — ABNORMAL HIGH (ref 70–99)
Potassium: 4.1 mmol/L (ref 3.5–5.1)
Sodium: 134 mmol/L — ABNORMAL LOW (ref 135–145)
Total Bilirubin: 0.9 mg/dL (ref 0.3–1.2)
Total Protein: 7.2 g/dL (ref 6.5–8.1)

## 2020-04-02 LAB — URINALYSIS, ROUTINE W REFLEX MICROSCOPIC
Bacteria, UA: NONE SEEN
Bilirubin Urine: NEGATIVE
Glucose, UA: NEGATIVE mg/dL
Hgb urine dipstick: NEGATIVE
Ketones, ur: NEGATIVE mg/dL
Leukocytes,Ua: NEGATIVE
Nitrite: NEGATIVE
Protein, ur: 30 mg/dL — AB
Specific Gravity, Urine: 1.02 (ref 1.005–1.030)
pH: 6 (ref 5.0–8.0)

## 2020-04-02 LAB — CBC
HCT: 41.9 % (ref 39.0–52.0)
Hemoglobin: 13.7 g/dL (ref 13.0–17.0)
MCH: 31.5 pg (ref 26.0–34.0)
MCHC: 32.7 g/dL (ref 30.0–36.0)
MCV: 96.3 fL (ref 80.0–100.0)
Platelets: 228 10*3/uL (ref 150–400)
RBC: 4.35 MIL/uL (ref 4.22–5.81)
RDW: 15.2 % (ref 11.5–15.5)
WBC: 10.6 10*3/uL — ABNORMAL HIGH (ref 4.0–10.5)
nRBC: 0 % (ref 0.0–0.2)

## 2020-04-02 LAB — LIPASE, BLOOD: Lipase: 335 U/L — ABNORMAL HIGH (ref 11–51)

## 2020-04-02 IMAGING — CT CT ABD-PELV W/ CM
2 of 5 series · 16 of 46 positions shown, 18 images · IV contrast (Omnipaque or Isovue)
Comparison: [DATE], [DATE]

CLINICAL DATA: Abdominal pain and recent fall, initial encounter

EXAM:
CT ABDOMEN AND PELVIS WITH CONTRAST
TECHNIQUE: Multidetector CT imaging of the abdomen and pelvis was performed
using the standard protocol following bolus administration of
intravenous contrast.
CONTRAST:  100mL OMNIPAQUE IOHEXOL 300 MG/ML  SOLN

[Series 2: axial st · axial · 0.77mm/px · z∈[+1131,+1526]mm · 13 of 91 slices shown, 15 images]
[im 6/91  soft-tissue]
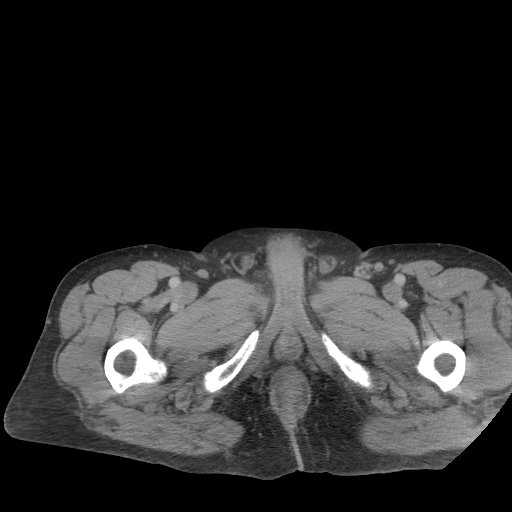
[im 6/91  bone]
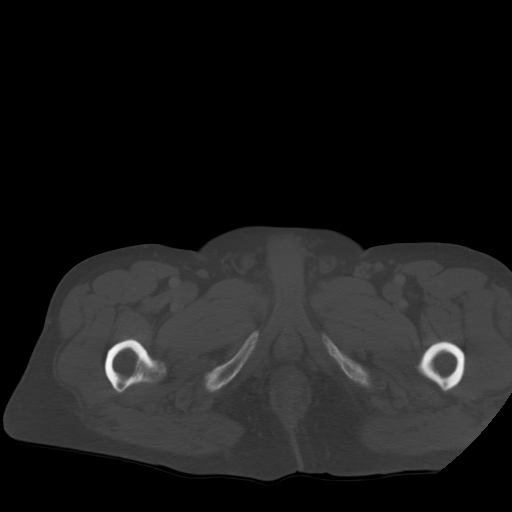
[im 11/91  soft-tissue]
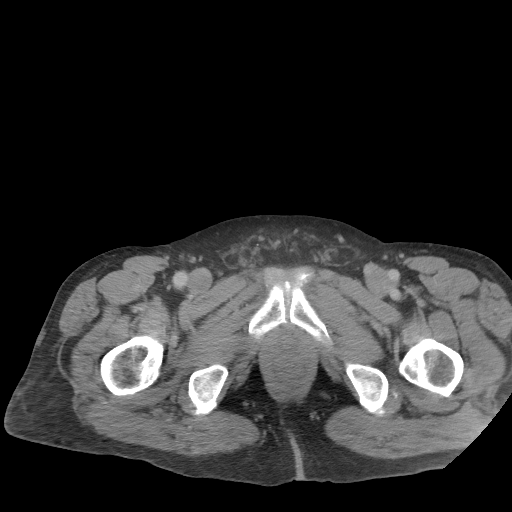
[im 22/91  soft-tissue]
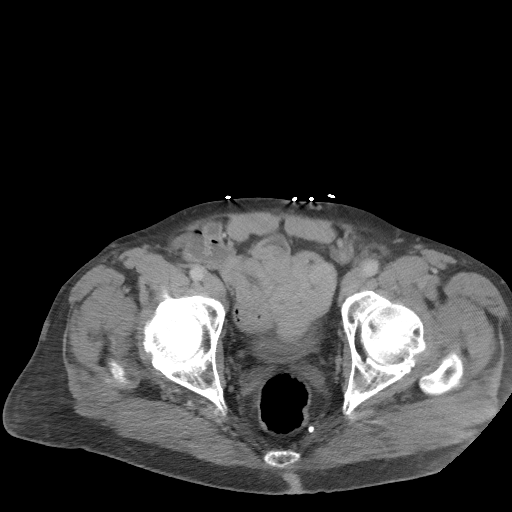
[im 27/91  soft-tissue]
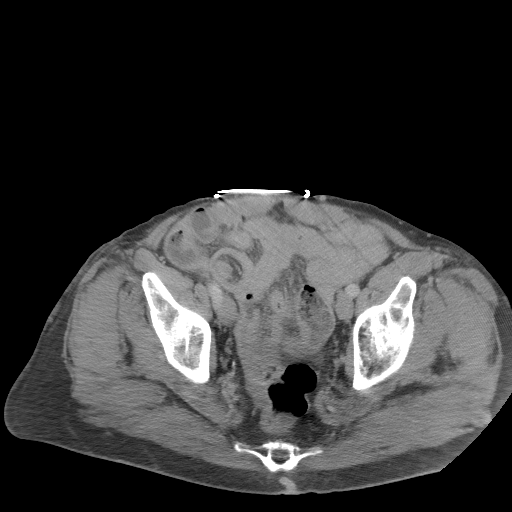
[im 32/91  soft-tissue]
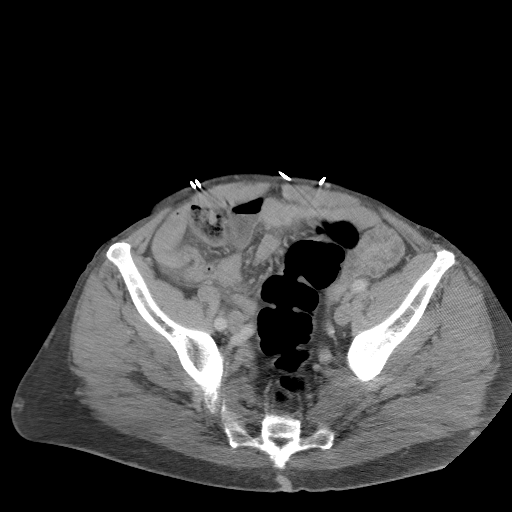
[im 38/91  soft-tissue]
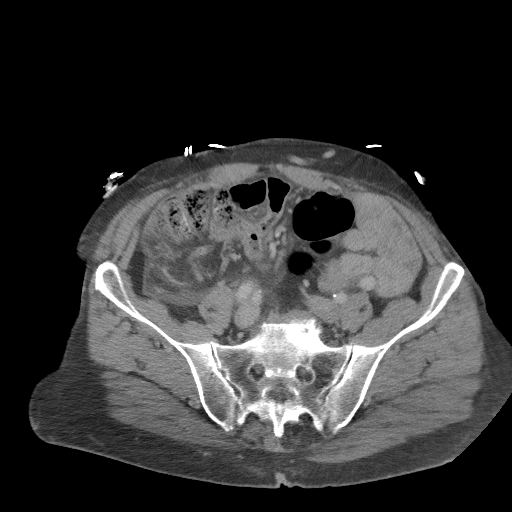
[im 48/91  soft-tissue]
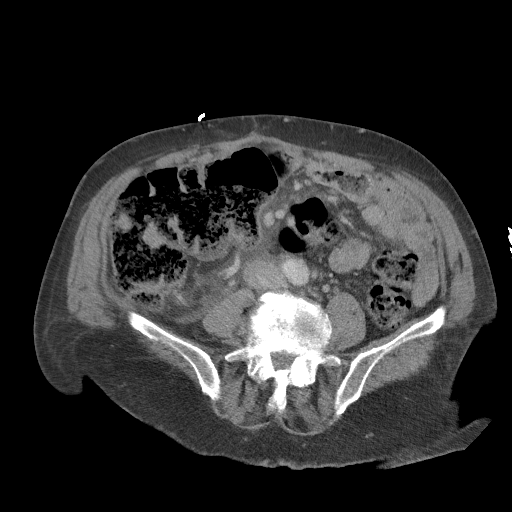
[im 53/91  soft-tissue]
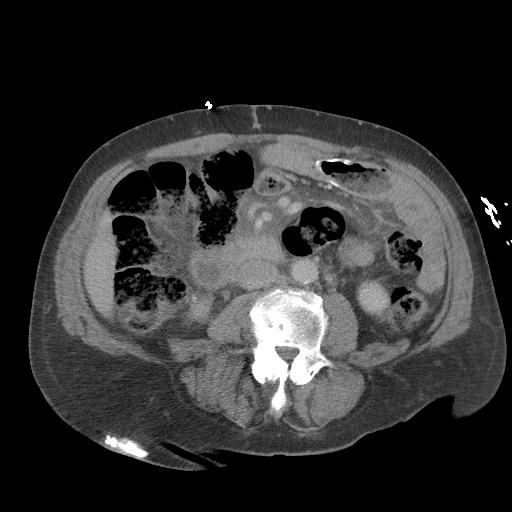
[im 59/91  soft-tissue]
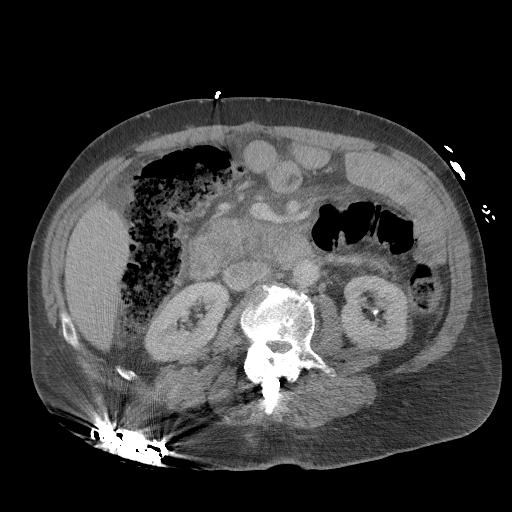
[im 59/91  bone]
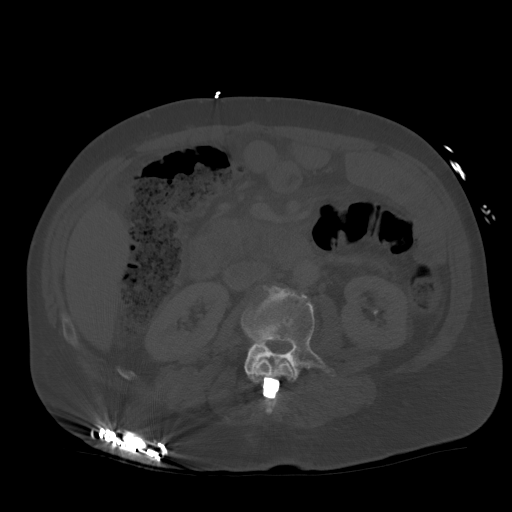
[im 64/91  soft-tissue]
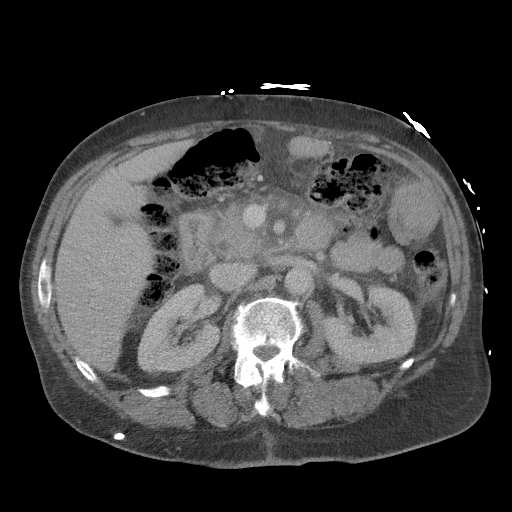
[im 69/91  soft-tissue]
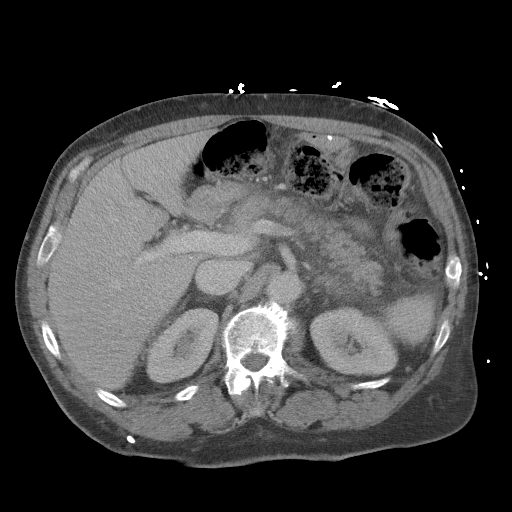
[im 80/91  soft-tissue]
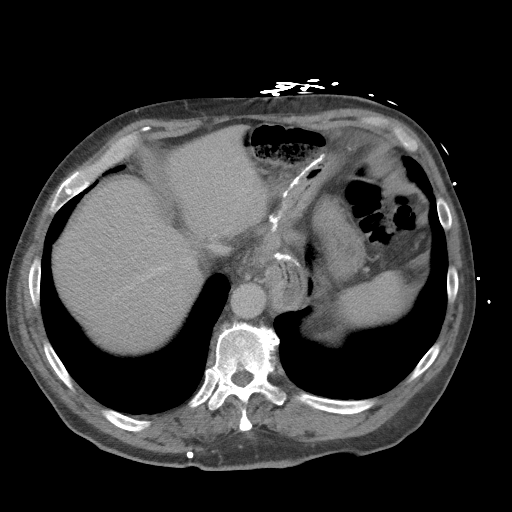
[im 85/91  soft-tissue]
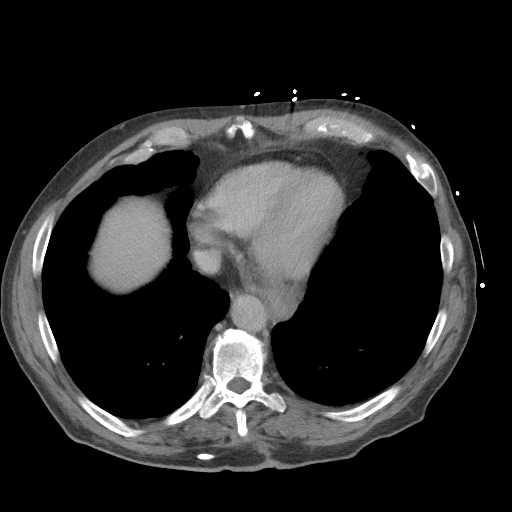

[Series 5: coronal st · coronal · 0.79mm/px · 3 of 90 slices shown]
[im 30/90  soft-tissue]
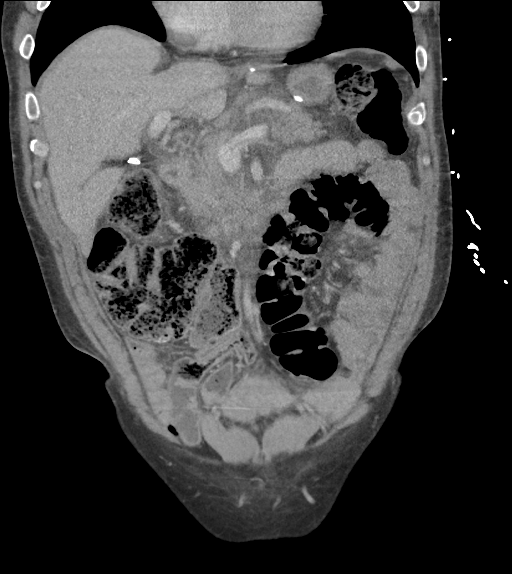
[im 40/90  soft-tissue]
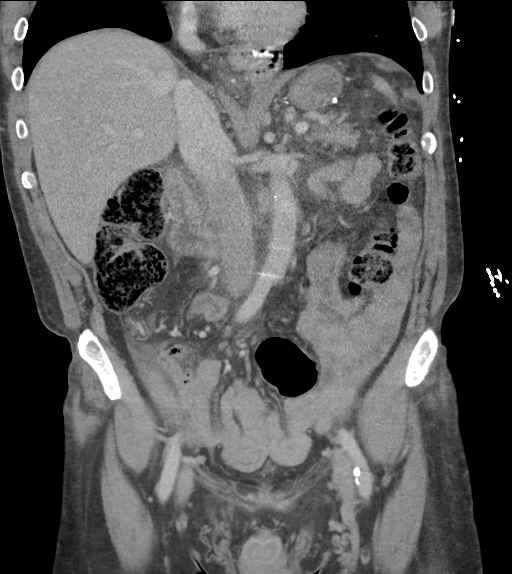
[im 50/90  soft-tissue]
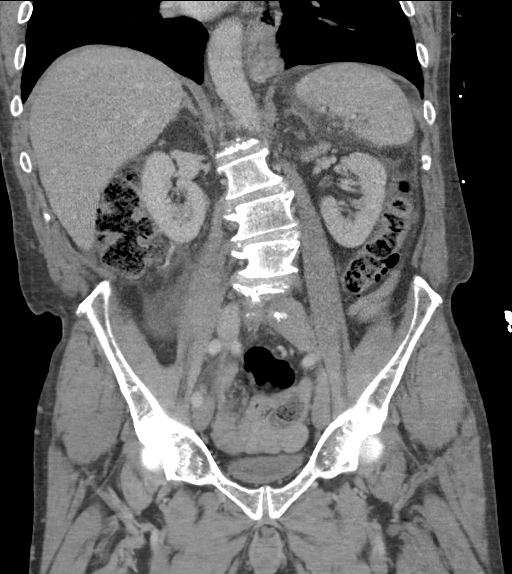

[16 of 46 positions shown; findings below may reference images not displayed]

FINDINGS: Lower chest: Lung bases demonstrate no focal infiltrate or sizable
effusion. Tiny nodular densities are noted stable from the prior CT
examination in [FX] consistent with a benign etiology.

Hepatobiliary: No focal liver abnormality is seen. Status post
cholecystectomy. No biliary dilatation.

Pancreas: Pancreas is diffusely prominent with edematous changes and
peripancreatic fluid. No areas of pancreatic necrosis are seen.
These changes are consistent with acute pancreatitis.

Spleen: Normal in size without focal abnormality.

Adrenals/Urinary Tract: Adrenal glands are within normal limits.
Kidneys demonstrate a normal enhancement pattern bilaterally.
Nonobstructing 3 mm stone is noted in the lower pole on the left.
This is new from the prior exam of [FX]. No obstructive changes are
seen. The bladder is decompressed.

Stomach/Bowel: The colon shows scattered retained fecal material
which may represent some mild constipation. The appendix is within
normal limits. No inflammatory changes are seen. Mild likely
transient ileal intussusception is noted in the right lower quadrant
best seen on images 68 of series 2 and image number 40 of series 5.
No associated obstructive changes are noted. Stomach demonstrates
postsurgical changes of Roux-en-Y with evidence of hiatal hernia.

Vascular/Lymphatic: Aortic atherosclerosis. No enlarged abdominal or
pelvic lymph nodes.

Reproductive: Prostate is unremarkable.

Other: No abdominal wall hernia or abnormality. No abdominopelvic
ascites.

Musculoskeletal: Degenerative changes of lumbar spine are noted.
Spinal stimulator is seen. Surgical fixation device at L2-3
posteriorly is noted
IMPRESSION: Pancreatic edema and peripancreatic fluid consistent with acute
pancreatitis. No changes of necrosis are seen.

Postsurgical changes in the stomach with hiatal hernia.

Likely transient ileal intussusception. No obstructive changes are
noted which speaks to the transient nature.

Nonobstructing left renal stone.

Scattered nodules in the lung bases stable from [FX] consistent with
a benign etiology.

## 2020-04-02 MED ORDER — HYDROCODONE-ACETAMINOPHEN 5-325 MG PO TABS
1.0000 | ORAL_TABLET | Freq: Once | ORAL | Status: AC
  Administered 2020-04-02: 1 via ORAL
  Filled 2020-04-02: qty 1

## 2020-04-02 MED ORDER — IOHEXOL 300 MG/ML  SOLN
100.0000 mL | Freq: Once | INTRAMUSCULAR | Status: AC | PRN
  Administered 2020-04-02: 100 mL via INTRAVENOUS

## 2020-04-02 NOTE — ED Notes (Signed)
Pt returned from CT °

## 2020-04-02 NOTE — ED Triage Notes (Signed)
C/o pain in lower back radiating o=into abdomen. States he feel a couple of weeks ago

## 2020-04-02 NOTE — ED Notes (Signed)
Patient transported to CT 

## 2020-04-03 NOTE — Discharge Instructions (Addendum)
Commend clear liquid diet for the next 2 days.  Then bland diet.  Return for any new or worse pain.  There was some question of some telescoping of the small bowel.  But deemed to be transient.  If you get significant pain in your abdomen this perhaps could be back.  And you need to return for follow-up.  Make an appointment to follow-up with your doctor in the next few days.

## 2020-04-03 NOTE — ED Provider Notes (Signed)
[facility] EMERGENCY DEPARTMENT Provider Note   CSN: [id number] Arrival date & time: 04/02/20  1803     History Chief Complaint  Patient presents with  . Abdominal Pain  . Back Pain    Antonio Lucas is a 65 y.o. male.  Patient with complaint of back pain that radiates around to both flanks and anteriorly.  He had a fall a couple weeks ago has a history of chronic back problems.  His back was not exactly hurting then but started to hurt here just in the past few days.  No nausea or vomiting or diarrhea.  No fevers.  No blood in his urine.  No difficulty urinating.        Past Medical History:  Diagnosis Date  . Arthritis   . COVID-19   . GERD (gastroesophageal reflux disease)   . Hyperlipemia   . Hypertension   . Pneumonia 1995    Patient Active Problem List   Diagnosis Date Noted  . Essential hypertension   . Unstable angina (HCC)   . Morbid obesity (HCC) 12/27/2011  . Edema 12/27/2011  . GERD (gastroesophageal reflux disease) 12/23/2011  . GI bleed 12/23/2011  . Hypertension 12/23/2011  . High cholesterol 12/23/2011    Past Surgical History:  Procedure Laterality Date  . BACK SURGERY     02.2011- HE HAD SOME NECK PROBLEMS WITH C7 .  . CARDIAC CATHETERIZATION N/A 12/04/2014   Procedure: Left Heart Cath and Coronary Angiography;  Surgeon: Lennette Bihari, MD;  Location: [facility] INVASIVE CV LAB;  Service: Cardiovascular;  Laterality: N/A;  . CARPAL TUNNEL RELEASE     both arms  . COLONOSCOPY  02/16/2012   Procedure: COLONOSCOPY;  Surgeon: Malissa Hippo, MD;  Location: [facility] ENDO SUITE;  Service: Endoscopy;  Laterality: N/A;  730  . ESOPHAGOGASTRODUODENOSCOPY N/A 12/14/2013   Procedure: ESOPHAGOGASTRODUODENOSCOPY (EGD);  Surgeon: Malissa Hippo, MD;  Location: [facility] ENDO SU[facility];  Service: Endoscopy;  Laterality: N/A;  1030  . ESOPHAGOGASTRODUODENOSCOPY (EGD) WITH ESOPHAGEAL DILATION  01/07/2012   Procedure: ESOPHAGOGASTRODUODENOSCOPY (EGD) WITH ESOPHAGEAL DILATION;   Surgeon: Malissa Hippo, MD;  Location: [facility] END[facility] SUITE;  Service: Endoscopy;  Laterality: N/A;  145  . MALONEY DILATION N/A 12/14/2013   Procedure: Elease Hashimoto DILATION;  Surgeon: Malissa Hippo, MD;  Location: [facility] SU[facility];  Service: Endoscopy;  Laterality: N/A;  . TONSILLECTOMY         Family History  Problem Relation Age of Onset  . Heart attack Father     Social History   Tobacco Use  . Smoking status: Former Smoker    Packs/day: 2.00    Years: 40.00    Pack years: 80.00    Types: Cigarettes    Start date: 03/03/1962    Quit date: 03/04/2007    Years since quitting: 13.0  . Smokeless tobacco: Never Used  Vaping Use  . Vaping Use: Never used  Substance Use Topics  . Alcohol use: Yes    Alcohol/week: 0.0 standard drinks    Comment: glass of wine daily  . Drug use: No    Home Medications Prior to Admission medications   Medication Sig Start Date End Date Taking? Authorizing Provider  amLODipine (NORVASC) 2.5 MG tablet Take 2.5 mg by mouth daily. 05/23/19  Yes [provider]  atorvastatin (LIPITOR) 10 MG tablet Take 10 mg by mouth daily.  01/19/18  Yes [provider]  CLINPRO 5000 1.1 % PSTE Place onto teeth 2 (two) times daily. 02/14/20  Yes [provider]  furosemide (LASIX) 20 MG tablet Take 40 mg by mouth daily.    Yes [provider]  HYDROcodone-acetaminophen (NORCO/VICODIN) 5-325 MG tablet Take 1-2 tablets by mouth every 6 (six) hours as needed. 02/15/20  Yes [provider]  methocarbamol (ROBAXIN) 500 MG tablet Take 500 mg by mouth every 8 (eight) hours as needed for muscle spasms. 03/22/20  Yes [provider]  Multiple Vitamin (MULTIVITAMIN) tablet Take 1 tablet by mouth every morning.   Yes [provider]  nitroGLYCERIN (NITROSTAT) 0.4 MG SL tablet Place 1 tablet (0.4 mg total) under the tongue every 5 (five) minutes as needed for chest pain. 11/30/14  Yes Jodelle Gross, NP  omeprazole (PRILOSEC)  40 MG capsule Take 40 mg by mouth 2 (two) times daily.   Yes [provider]  ondansetron (ZOFRAN-ODT) 8 MG disintegrating tablet LET ONE TABLET MELT ON YOUR TONGUE EVERY 6 HOURS AS NEEDED FOR NAUSEA 02/23/18  Yes [provider]  tadalafil (CIALIS) 5 MG tablet Take 5 mg by mouth daily.   Yes [provider]  testosterone cypionate (DEPOTESTOTERONE CYPIONATE) 200 MG/ML injection Inject 0.5 mLs into the muscle every 14 (fourteen) days.  11/17/11  Yes [provider]  tiZANidine (ZANAFLEX) 2 MG tablet Take 2 mg by mouth 2 (two) times daily. 03/14/20  Yes [provider]  traZODone (DESYREL) 100 MG tablet Take 100 mg by mouth at bedtime as needed for sleep. 07/08/17  Yes [provider]  zolmitriptan (ZOMIG) 5 MG tablet Take 1 tablet by mouth every 2 (two) hours as needed for migraine.  10/29/12  Yes [provider]  amoxicillin-clavulanate (AUGMENTIN) 875-125 MG tablet Take 1 tablet by mouth every 12 (twelve) hours. Patient not taking: No sig reported 02/10/18   Petrucelli, Samantha R, PA-C  atorvastatin (LIPITOR) 40 MG tablet TAKE 1 TABLET EVERY DAY Patient not taking: Reported on 04/02/2020 10/17/13   Laurey Morale, MD  benzonatate (TESSALON) 100 MG capsule Take 1 capsule (100 mg total) by mouth every 8 (eight) hours. Patient not taking: No sig reported 06/06/19   Petrucelli, Samantha R, PA-C    Allergies    Imdur [isosorbide nitrate] and Fentanyl  Review of Systems   Review of Systems  Constitutional: Negative for chills and fever.  HENT: Negative for rhinorrhea and sore throat.   Eyes: Negative for visual disturbance.  Respiratory: Negative for cough and shortness of breath.   Cardiovascular: Negative for chest pain and leg swelling.  Gastrointestinal: Positive for abdominal pain. Negative for diarrhea, nausea and vomiting.  Genitourinary: Positive for flank pain. Negative for dysuria.  Musculoskeletal: Positive for back pain.  Negative for neck pain.  Skin: Negative for rash.  Neurological: Negative for dizziness, light-headedness and headaches.  Hematological: Does not bruise/bleed easily.  Psychiatric/Behavioral: Negative for confusion.    Physical Exam Updated Vital Signs BP (!) 161/98 (BP Location: Left Arm)   Pulse 92   Temp 98.8 F (37.1 C) (Oral)   Resp 18   SpO2 100%   Physical Exam Vitals and nursing note reviewed.  Constitutional:      General: He is not in acute distress.    Appearance: Normal appearance. He is well-developed and well-nourished.  HENT:     Head: Normocephalic and atraumatic.  Eyes:     Extraocular Movements: Extraocular movements intact.     Conjunctiva/sclera: Conjunctivae normal.     Pupils: Pupils are equal, round, and reactive to light.  Cardiovascular:  Rate and Rhythm: Normal rate and regular rhythm.     Heart sounds: No murmur heard.   Pulmonary:     Effort: Pulmonary effort is normal. No respiratory distress.     Breath sounds: Normal breath sounds.  Abdominal:     General: There is no distension.     Palpations: Abdomen is soft. There is no mass.     Tenderness: There is no abdominal tenderness. There is no guarding.  Musculoskeletal:        General: No swelling or edema. Normal range of motion.     Cervical back: Neck supple.  Skin:    General: Skin is warm and dry.  Neurological:     Mental Status: He is alert and oriented to person, place, and time.     Cranial Nerves: No cranial nerve deficit.     Sensory: No sensory deficit.     Motor: No weakness.  Psychiatric:        Mood and Affect: Mood and affect normal.     ED Results / Procedures / Treatments   Labs (all labs ordered are listed, but only abnormal results are displayed) Labs Reviewed  LIPASE, BLOOD - Abnormal; Notable for the following components:      Result Value   Lipase 335 (*)    All other components within normal limits  COMPREHENSIVE METABOLIC PANEL - Abnormal; Notable  for the following components:   Sodium 134 (*)    Glucose, Bld 131 (*)    All other components within normal limits  CBC - Abnormal; Notable for the following components:   WBC 10.6 (*)    All other components within normal limits  URINALYSIS, ROUTINE W REFLEX MICROSCOPIC - Abnormal; Notable for the following components:   Protein, ur 30 (*)    All other components within normal limits    EKG None  Radiology CT Abdomen Pelvis W Contrast  Result Date: 04/02/2020 CLINICAL DATA:  Abdominal pain and recent fall, initial encounter EXAM: CT ABDOMEN AND PELVIS WITH CONTRAST TECHNIQUE: Multidetector CT imaging of the abdomen and pelvis was performed using the standard protocol following bolus administration of intravenous contrast. CONTRAST:  OMNIPAQUE IOHEXOL 300 MG/ML  SOLN COMPARISON:  11/14/2012, 06/06/2019 FINDINGS: Lower chest: Lung bases demonstrate no focal infiltrate or sizable effusion. Tiny nodular densities are noted stable from the prior CT examination in 2014 consistent with a benign etiology. Hepatobiliary: No focal liver abnormality is seen. Status post cholecystectomy. No biliary dilatation. Pancreas: Pancreas is diffusely prominent with edematous changes and peripancreatic fluid. No areas of pancreatic necrosis are seen. These changes are consistent with acute pancreatitis. Spleen: Normal in size without focal abnormality. Adrenals/Urinary Tract: Adrenal glands are within normal limits. Kidneys demonstrate a normal enhancement pattern bilaterally. Nonobstructing 3 mm stone is noted in the lower pole on the left. This is new from the prior exam of 2014. No obstructive changes are seen. The bladder is decompressed. Stomach/Bowel: The colon shows scattered retained fecal material which may represent some mild constipation. The appendix is within normal limits. No inflammatory changes are seen. Mild likely transient ileal intussusception is noted in the right lower quadrant best seen on  images 68 of series 2 and image number 40 of series 5. No associated obstructive changes are noted. 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 Vascular/Lymphatic: Aortic atherosclerosis. No enlarged abdominal or pelvic lymph nodes. Reproductive: Prostate is unremarkable. Other: No abdominal wall hernia or abnormality. No abdominopelvic ascites. Musculoskeletal: Degenerative changes  of lumbar spine are noted. Spinal stimulator is seen. Surgical fixation device at L2-3 posteriorly is noted IMPRESSION: Pancreatic edema and peripancreatic fluid consistent with acute pancreatitis. No changes of necrosis are seen. Postsurgical changes in the stomach with hiatal hernia. Likely transient ileal intussusception. No obstructive changes are noted which speaks to the transient nature. Nonobstructing left renal stone. Scattered nodules in the lung bases stable from 2014 consistent with a benign etiology. Electronically Signed   By: Alcide Clever M.D.   On: 04/02/2020 22:56    Procedures Procedures   Medications Ordered in ED Medications  iohexol (OMNIPAQUE) 300 MG/ML solution 100 mL (100 mLs Intravenous Contrast Given 04/02/20 2236)  HYDROcodone-acetaminophen (NORCO/VICODIN) 5-325 MG per tablet 1 tablet (1 tablet Oral Given 04/02/20 2359)    ED Course  I have reviewed the triage vital signs and the nursing notes.  Pertinent labs & imaging results that were available during my care of the patient were reviewed by me and considered in my medical decision making (see chart for details).    MDM Rules/Calculators/A&P                          Labs showed a significant elevation in the lipase at 335.  No significant hepatic function abnormalities.  Or any significant electrolyte abnormalities.  Blood cell count slightly elevated at 10.6.  Based on this we decided to do CT abdomen and that would look at the musculoskeletal part of the lumbar back.  No bony abnormalities  other than chronic findings on that.  But it was consistent with pancreatitis.  With everything around the liver area looked fine.  Patient is not having any persistent nausea or vomiting.  To should be stable for discharge home with a clear liquid diet.  And then advancing to bland diet follow-up with regular doctor.  CT did raise some question of a transient ileal intussusception.  Patient symptoms do not seem to fit that.  And did mention transient.  Attempt to discussed that with on-call general surgery.  But cannot get them to call back.  Patient wanting to go home.  I do not feel that there is any significant intussusception ongoing precautions provided to the patient.  Patient will return for any new or worse symptoms   Final Clinical Impression(s) / ED Diagnoses Final diagnoses:  Acute pancreatitis, unspecified complication status, unspecified pancreatitis type  Strain of lumbar region, initial encounter    Rx / DC Orders ED Discharge Orders    None       Vanetta Mulders, MD 04/03/20 [phone]

## 2020-05-14 ENCOUNTER — Other Ambulatory Visit: Payer: Self-pay | Admitting: Orthopaedic Surgery

## 2020-05-14 DIAGNOSIS — S0093XA Contusion of unspecified part of head, initial encounter: Secondary | ICD-10-CM

## 2020-05-18 ENCOUNTER — Other Ambulatory Visit: Payer: Self-pay | Admitting: Orthopaedic Surgery

## 2020-05-18 DIAGNOSIS — M47896 Other spondylosis, lumbar region: Secondary | ICD-10-CM

## 2020-05-19 ENCOUNTER — Other Ambulatory Visit: Payer: Self-pay

## 2020-05-19 ENCOUNTER — Ambulatory Visit
Admission: RE | Admit: 2020-05-19 | Discharge: 2020-05-19 | Disposition: A | Discharge status: Home / Self Care | Payer: Federal, State, Local not specified - PPO | Source: Ambulatory Visit | Attending: Orthopaedic Surgery | Admitting: Orthopaedic Surgery

## 2020-05-19 DIAGNOSIS — S0093XA Contusion of unspecified part of head, initial encounter: Secondary | ICD-10-CM

## 2020-05-19 IMAGING — CT CT HEAD W/O CM
1 series · 16 of 30 positions shown, 20 images · non-contrast
Comparison: CT head [DATE]

CLINICAL DATA: Status post fall, hit head.  Knot on back of head

EXAM:
CT HEAD WITHOUT CONTRAST
TECHNIQUE: Contiguous axial images were obtained from the base of the skull
through the vertex without intravenous contrast.

[Series 2: head w/(date) · axial · 0.49mm/px · z∈[+382,+537]mm · 16 of 35 slices shown, 20 images]
[im 2/35  brain]
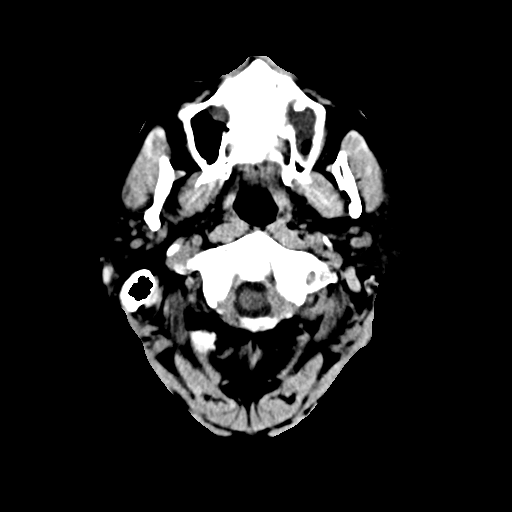
[im 2/35  bone]
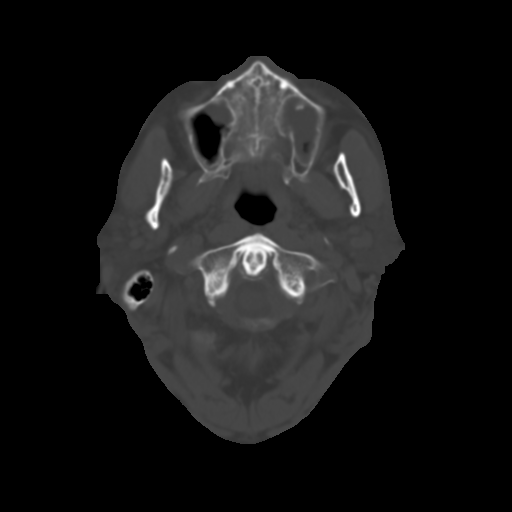
[im 4/35  brain]
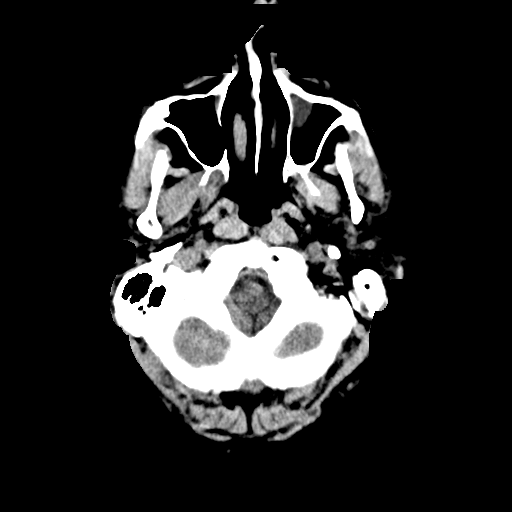
[im 6/35  brain]
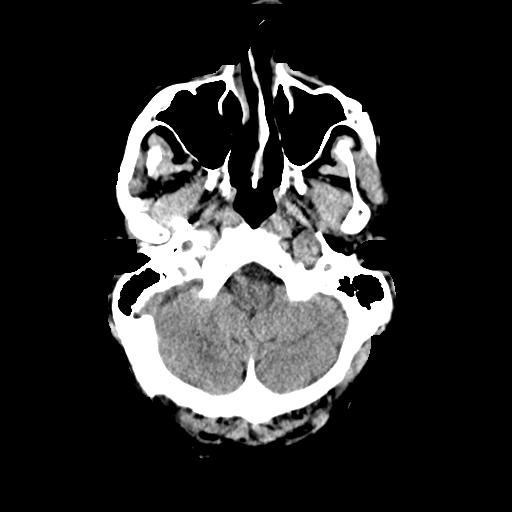
[im 9/35  brain]
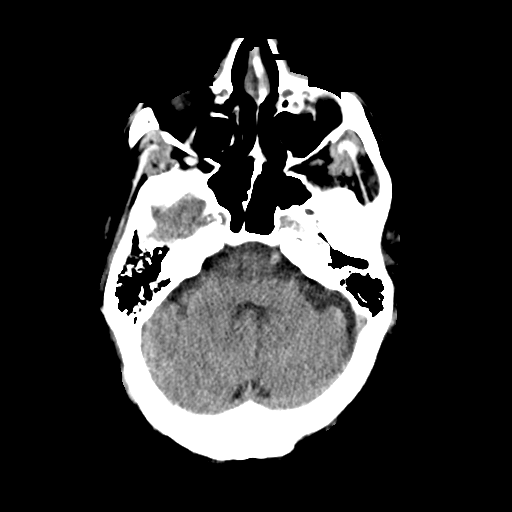
[im 10/35  brain]
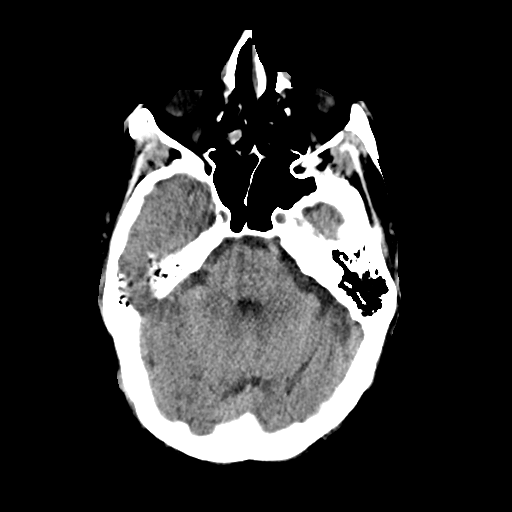
[im 10/35  bone]
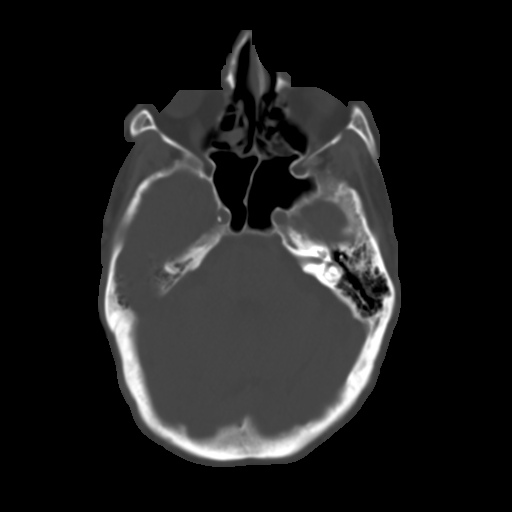
[im 12/35  brain]
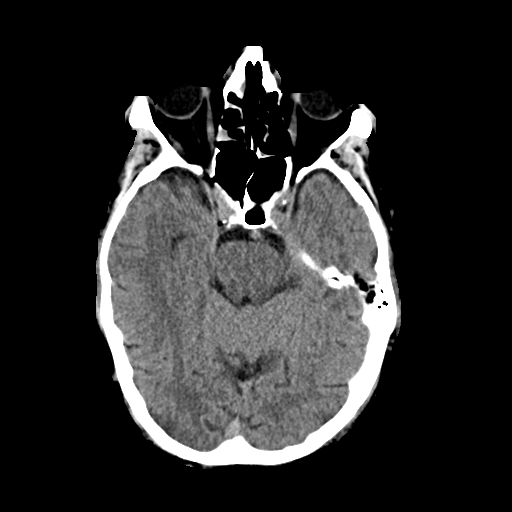
[im 15/35  brain]
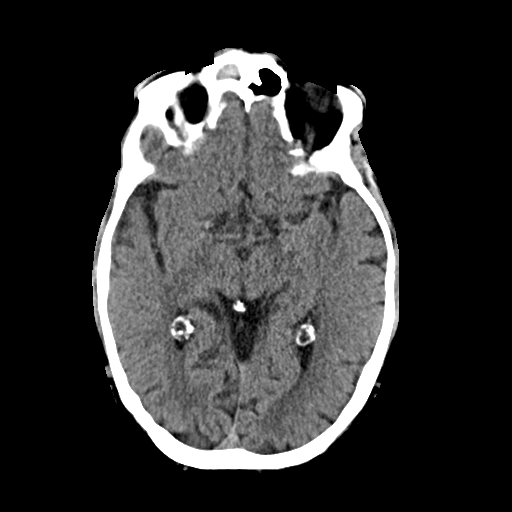
[im 17/35  brain]
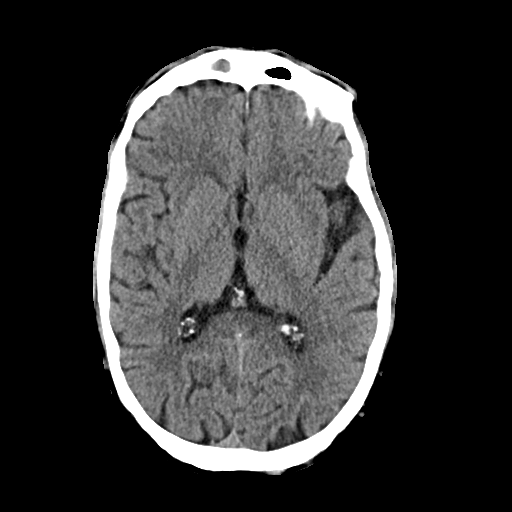
[im 18/35  brain]
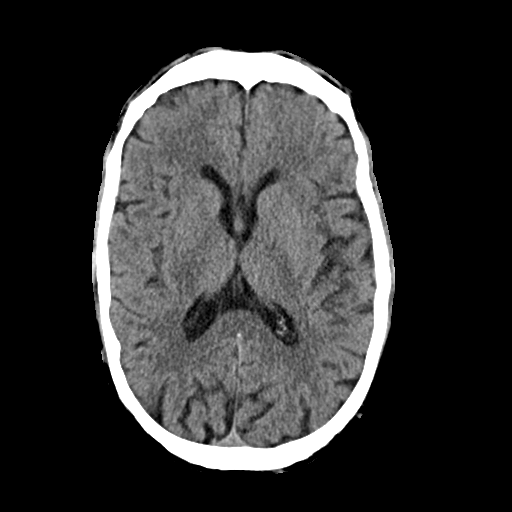
[im 18/35  bone]
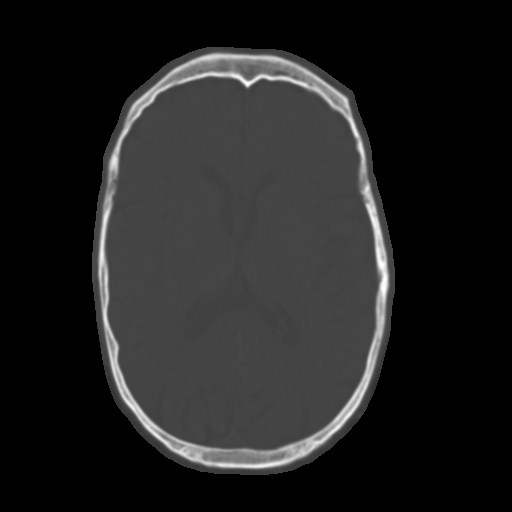
[im 20/35  brain]
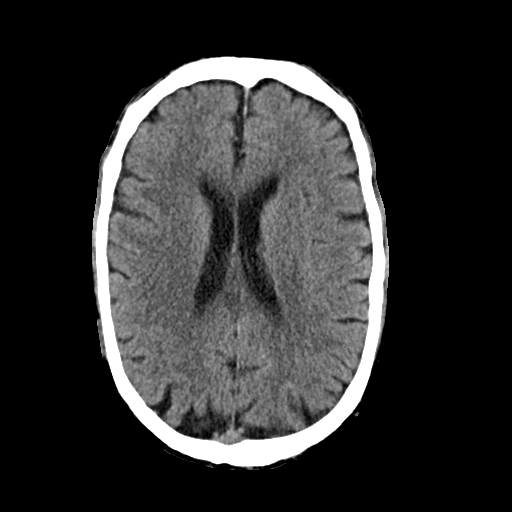
[im 23/35  brain]
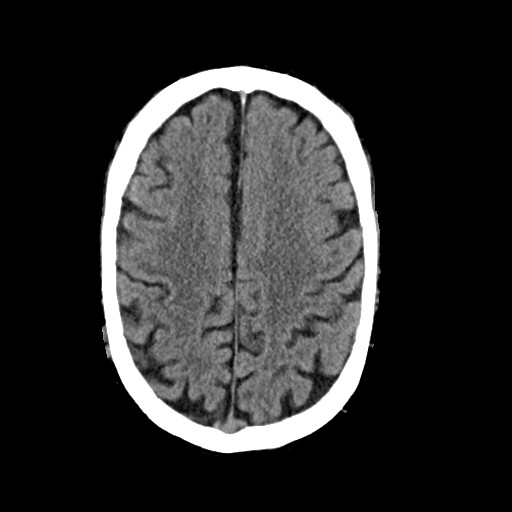
[im 25/35  brain]
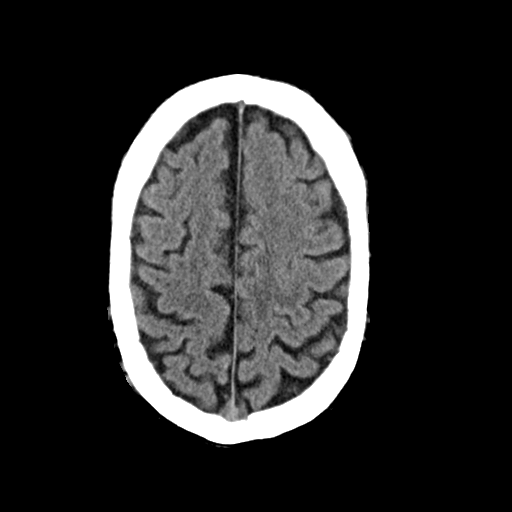
[im 26/35  brain]
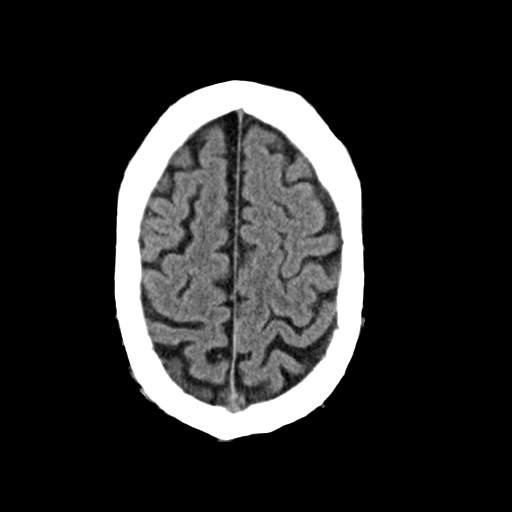
[im 26/35  bone]
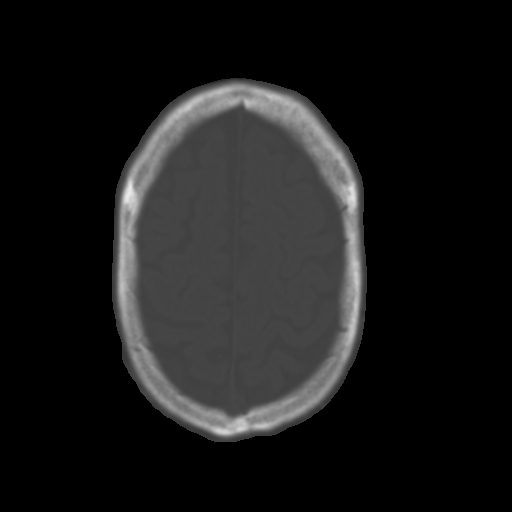
[im 29/35  brain]
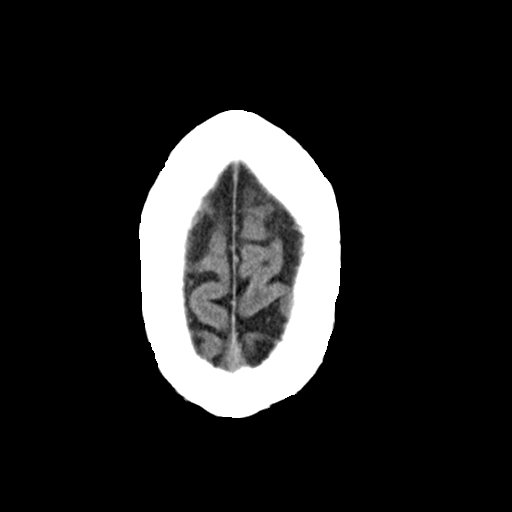
[im 31/35  brain]
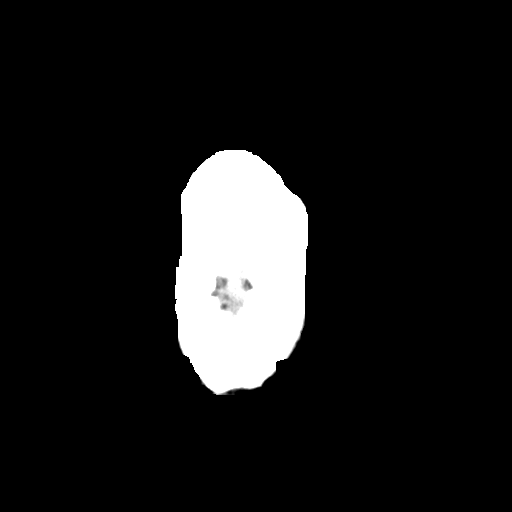
[im 33/35  brain]
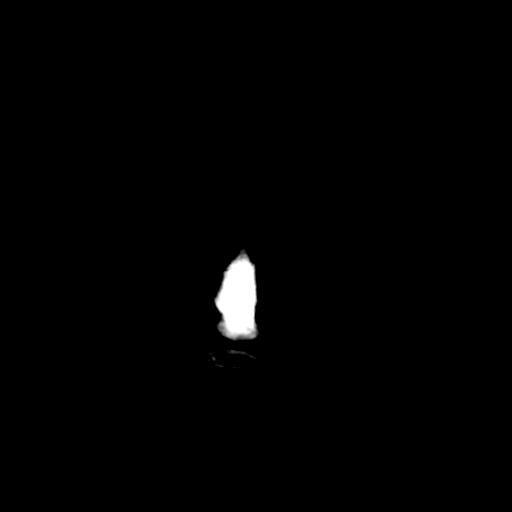

[16 of 30 positions shown; findings below may reference images not displayed]

FINDINGS: Brain:

No evidence of large-territorial acute infarction. No parenchymal
hemorrhage. No mass lesion. No extra-axial collection.

No mass effect or midline shift. No hydrocephalus. Basilar cisterns
are patent.

Vascular: No hyperdense vessel.

Skull: No acute fracture or focal lesion.

Sinuses/Orbits: Right maxillary sinus and left maxillary sinus
mucosal thickening. Bilateral ethmoid and frontal mucosal
thickening. Paranasal sinuses and mastoid air cells are clear. The
orbits are unremarkable.

Other: None.
IMPRESSION: No acute intracranial abnormality.

## 2020-06-14 ENCOUNTER — Ambulatory Visit
Admission: RE | Admit: 2020-06-14 | Discharge: 2020-06-14 | Disposition: A | Discharge status: Home / Self Care | Payer: Federal, State, Local not specified - PPO | Source: Ambulatory Visit | Attending: Orthopaedic Surgery | Admitting: Orthopaedic Surgery

## 2020-06-14 ENCOUNTER — Other Ambulatory Visit: Payer: Self-pay

## 2020-06-14 DIAGNOSIS — M47896 Other spondylosis, lumbar region: Secondary | ICD-10-CM

## 2020-06-14 IMAGING — MR MR LUMBAR SPINE W/O CM
4 of 5 series · 24 of 48 positions shown · non-contrast
Comparison: None.

CLINICAL DATA: Back pain and bilateral buttock pain after fall.

EXAM:
MRI LUMBAR SPINE WITHOUT CONTRAST
TECHNIQUE: Multiplanar, multisequence MR imaging of the lumbar spine was
performed. No intravenous contrast was administered.

[Series 3: T2 · sagittal · 4.0mm · 0.55mm/px · 7 of 16 slices shown (1 of 2)]
[im 1/16]
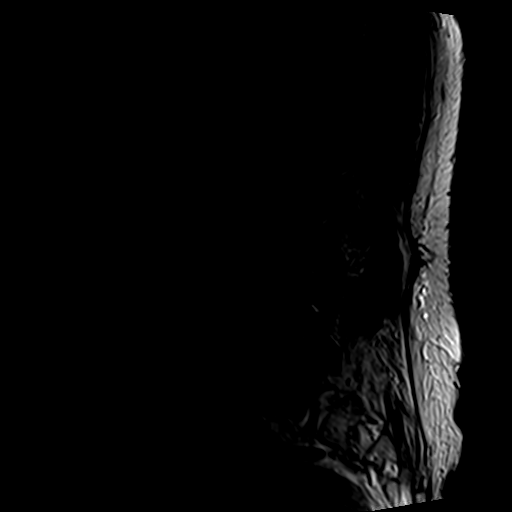
[im 3/16]
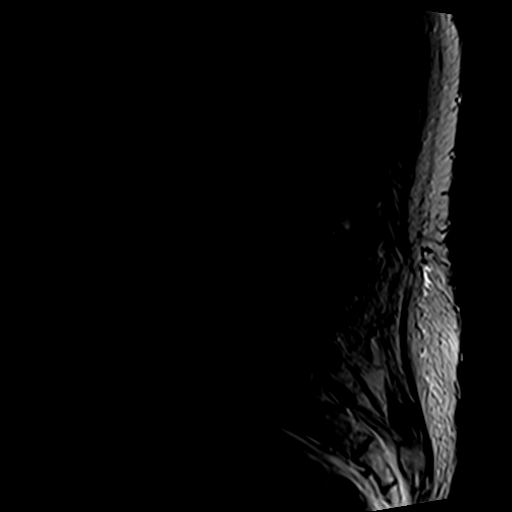
[im 6/16]
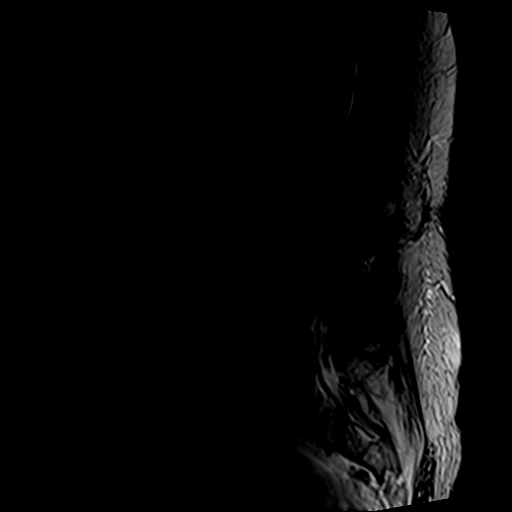
[im 8/16]
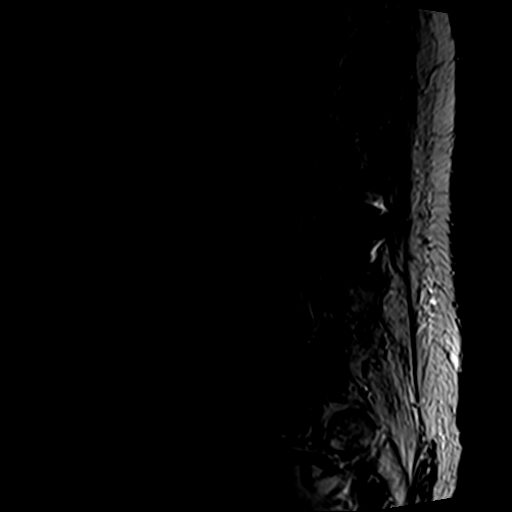
[im 11/16]
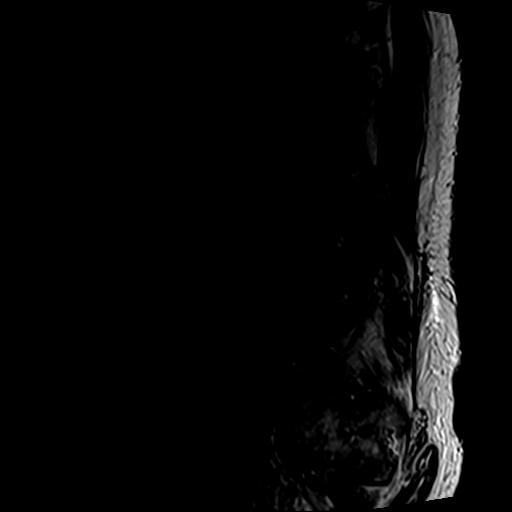
[im 13/16]
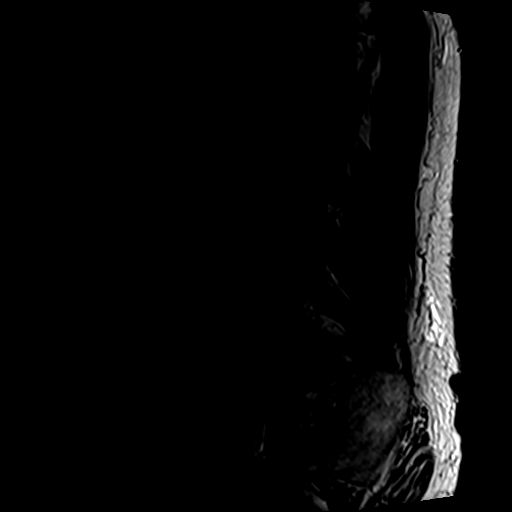
[im 16/16]
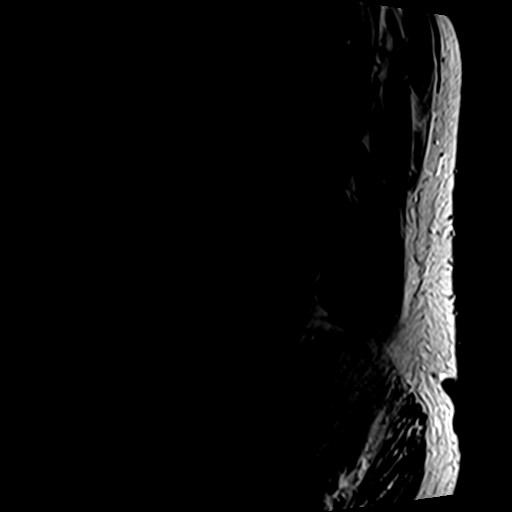

[Series 5: T1 · sagittal · 4.0mm · 0.55mm/px · 6 of 16 slices shown (1 of 2)]
[im 1/16]
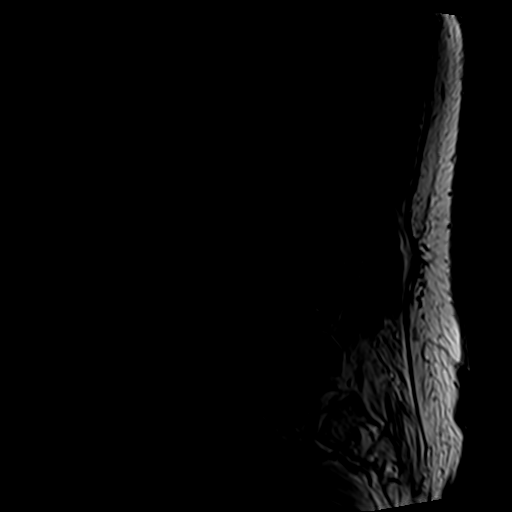
[im 4/16]
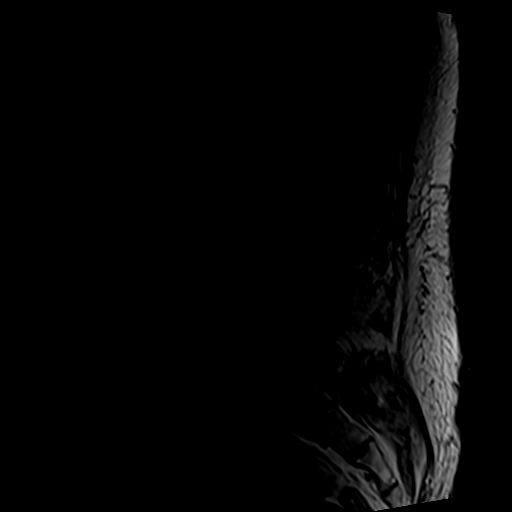
[im 7/16]
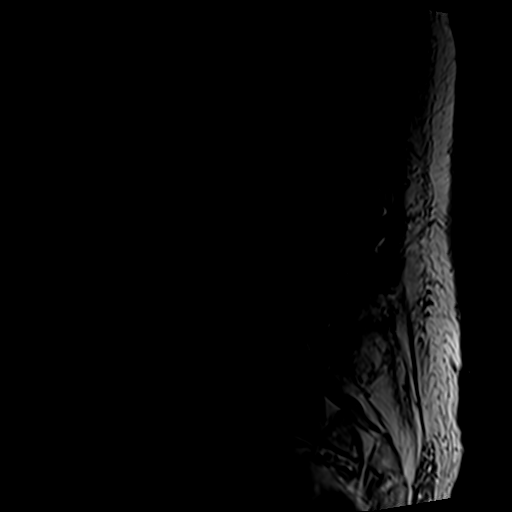
[im 10/16]
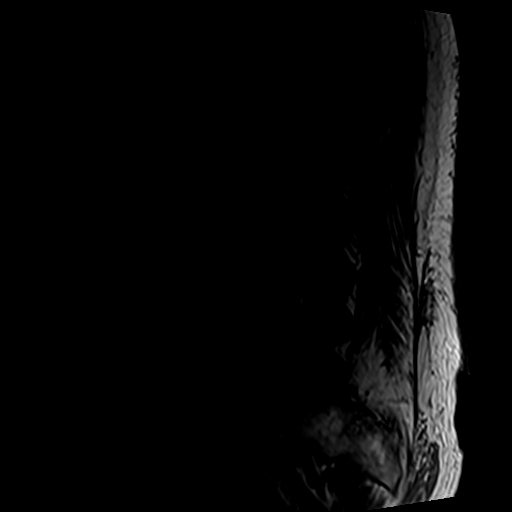
[im 13/16]
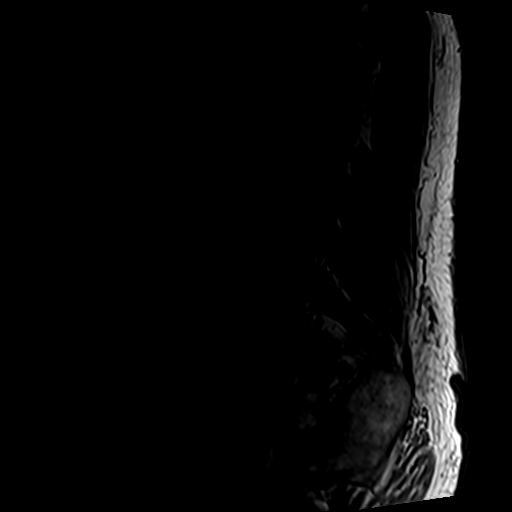
[im 16/16]
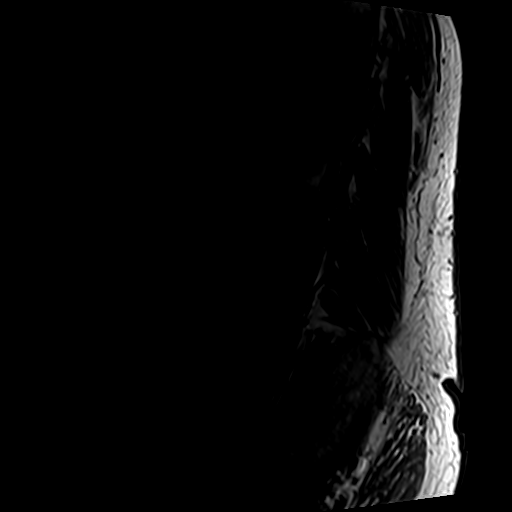

[Series 6: T2 · axial · 4.0mm · 0.70mm/px · z∈[-65,+109]mm · 8 of 36 slices shown (2 of 2)]
[im 1/36]
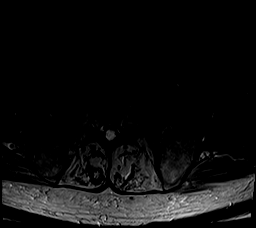
[im 6/36]
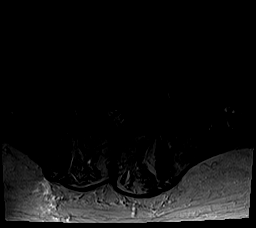
[im 11/36]
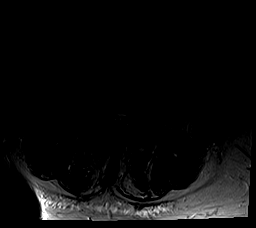
[im 17/36]
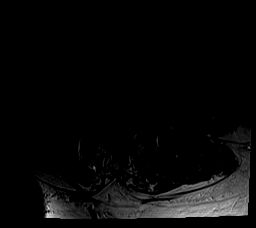
[im 19/36]
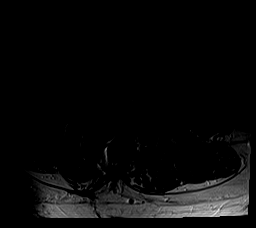
[im 25/36]
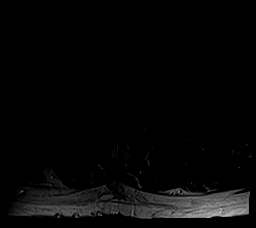
[im 30/36]
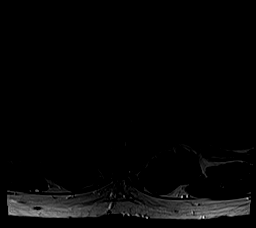
[im 36/36]
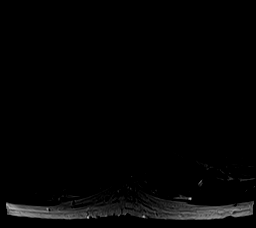

[Series 7: T1 · axial · 4.0mm · 0.35mm/px · z∈[-39,+78]mm · 3 of 36 slices shown (2 of 2)]
[im 6/36]
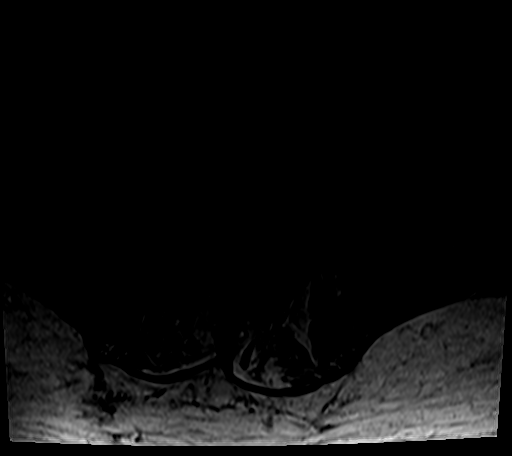
[im 19/36]
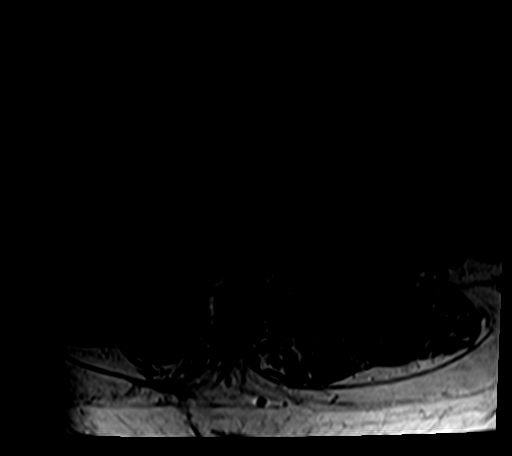
[im 30/36]
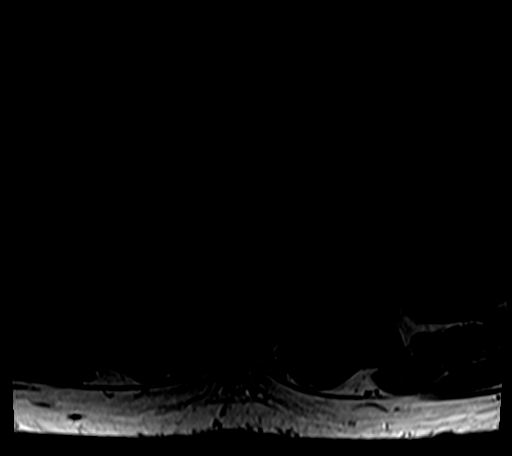

[24 of 48 positions shown; findings below may reference images not displayed]

FINDINGS: Segmentation: Standard segmentation is assumed. The inferior-most
fully formed intervertebral disc is labeled L5-S1.

Alignment: Levocurvature. Slight (grade 1) retrolisthesis of L1 on
L2 and anterolisthesis of L3 on L4.

Vertebrae: Left a centric discogenic/degenerative endplate signal
changes. Vertebral body heights are maintained. No specific evidence
of acute fracture, discitis/osteomyelitis, or suspicious bone
lesion.

Conus medullaris and cauda equina: Conus extends to the L1-L2 level.
Conus appears normal.

Paraspinal and other soft tissues: Unremarkable.

Disc levels:

Motion limited evaluation.  Within this limitation:

T12-L1: Small disc bulge and mild facet hypertrophy without
significant canal or foraminal stenosis.

L1-L2: Slight retrolisthesis of L1 on L2. broad disc bulge with mild
to moderate bilateral foraminal narrowing, not substantially
changed. No significant canal stenosis.

L2-L3: Broad disc bulge with progressive right greater than left
facet hypertrophy. Resulting moderate canal stenosis, similar to
prior. Moderate to severe right foraminal stenosis, progressed from
prior. Mild left foraminal stenosis.

L3-L4: Severe right eccentric disc height loss and desiccation.
Slight (grade 1) anterolisthesis of L3 on L4. Previous left
laminotomy. Right greater than left facet hypertrophy. Mild to
moderate canal stenosis, improved from prior. Mild-to-moderate right
foraminal stenosis, progressed from prior. No significant left
foraminal stenosis.

L4-L5: Severe right eccentric disc height loss and desiccation.
Suspected prior bilateral laminotomies. Disc bulge and posterior
endplate spurring. Mild canal stenosis, improved from prior.
Bilateral facet hypertrophy with moderate right foraminal stenosis,
progressed from prior. No significant left foraminal stenosis.

L5-S1: Prior left laminectomy. Broad disc bulge with bilateral facet
hypertrophy. Moderate left foraminal stenosis, likely similar to
prior. Mild canal stenosis.
IMPRESSION: 1. Advanced multilevel degenerative change with progression of
foraminal stenosis at multiple levels, as detailed above. Motion
limits evaluation; however, there is suspected moderate to severe
foraminal stenosis on the right at L2-L3 and moderate foraminal
stenosis on the right at L4-L5 and the left at L5-S1.
2. Similar moderate canal stenosis at L2-L3. Mild to moderate canal
stenosis at L3-L4 and mild canal stenosis at at L4-L5 and L5-S1,
improved from the prior.

## 2020-08-22 ENCOUNTER — Ambulatory Visit: Payer: Medicare Other | Admitting: Physical Therapy

## 2020-08-23 ENCOUNTER — Ambulatory Visit: Payer: Medicare Other | Attending: Orthopaedic Surgery

## 2020-08-23 ENCOUNTER — Other Ambulatory Visit: Payer: Self-pay

## 2020-08-23 DIAGNOSIS — M6281 Muscle weakness (generalized): Secondary | ICD-10-CM | POA: Diagnosis present

## 2020-08-23 DIAGNOSIS — M47816 Spondylosis without myelopathy or radiculopathy, lumbar region: Secondary | ICD-10-CM | POA: Insufficient documentation

## 2020-08-23 DIAGNOSIS — R296 Repeated falls: Secondary | ICD-10-CM | POA: Diagnosis present

## 2020-08-23 DIAGNOSIS — R262 Difficulty in walking, not elsewhere classified: Secondary | ICD-10-CM | POA: Diagnosis present

## 2020-08-23 NOTE — Therapy (Signed)
[facility] [address] [place], [place], [address] Phone: [phone]   Fax:  [phone]  Physical Therapy Evaluation  Patient Details  Name: Antonio Lucas MRN: [id number] Date of Birth: 11/18/1955 Referring Provider (PT): Max Noel Gerold   Encounter Date: 08/23/2020   PT End of Session - 08/23/20 1152     Visit Number 1    Number of Visits 12    Date for PT Re-Evaluation 10/04/20    PT Start Time 0815    PT Stop Time 0907    PT Time Calculation (min) 52 min    Activity Tolerance Patient tolerated treatment well;Patient limited by pain    Behavior During Therapy [facility] for tasks assessed/performed             Past Medical History:  Diagnosis Date   Arthritis    COVID-19    GERD (gastroesophageal reflux disease)    Hyperlipemia    Hypertension    Pneumonia 1995    Past Surgical History:  Procedure Laterality Date   ANTERIOR CERVICAL DECOMP/DISCECTOMY FUSION     BACK SURGERY     02.2011- HE HAD SOME NECK PROBLEMS WITH C7 .   CARDIAC CATHETERIZATION N/A 12/04/2014   Procedure: Left Heart Cath and Coronary Angiography;  Surgeon: Lennette Bihari, MD;  [facility];  Service: Cardiovascular;  Laterality: N/A;   CARPAL TUNNEL RELEASE     both arms   COLON SURGERY     COLONOSCOPY  02/16/2012   Procedure: COLONOSCOPY;  Surgeon: Malissa Hippo, MD;  [facility];  Service: Endoscopy;  Laterality: N/A;  730   ESOPHAGOGASTRODUODENOSCOPY N/A 12/14/2013   Procedure: ESOPHAGOGASTRODUODENOSCOPY (EGD);  Surgeon: Malissa Hippo, MD;  [facility];  Service: Endoscopy;  Laterality: N/A;  1030   ESOPHAGOGASTRODUODENOSCOPY (EGD) WITH ESOPHAGEAL DILATION  01/07/2012   Procedure: ESOPHAGOGASTRODUODENOSCOPY (EGD) WITH ESOPHAGEAL DILATION;  Surgeon: Malissa Hippo, MD;  [facility];  Service: Endoscopy;  Laterality: N/A;  145   MALONEY DILATION N/A 12/14/2013   Procedure: Elease Hashimoto DILATION;   Surgeon: Malissa Hippo, MD;  [facility];  Service: Endoscopy;  Laterality: N/A;   TONSILLECTOMY      There were no vitals filed for this visit.    Subjective Assessment - 08/23/20 0821     Subjective Patient states that he has had several surgeries and issues in the past few months and things have been worse with pain. Since september last year, he has had abdominal and colon surgery. He also had a  In February he had a cervical fusion. His R leg with or without pain some times it will simply give out on him. He cannot predict it but he does fall often. He holds on going up and down stairs. He is very active needing to be able to get around his yard and garden. He avoids lifting greater than 25#  due to fear of causign more problems in the back. He states that going down hill andup i more challenging. But at times he has  had difficulty going to the bathroom.    Pertinent History vertiflex, lumbar stimulator, previous laminectiomy, bulging discs, ACDF C                [facility] PT Assessment - 08/23/20 0001       Assessment   Medical Diagnosis Lumbar Spondylosis    Referring Provider (PT) Max Cohen    Onset Date/Surgical Date 04/12/20   (  patient to bring medical/surgical history next visit)   Next MD Visit 08/29/20    Prior Therapy Yes      Precautions   Precautions Other (comment);Fall;Cervical    Precaution Comments Lumbar stimulaor      Balance Screen   Has the patient fallen in the past 6 months Yes    How many times? 12    Has the patient had a decrease in activity level because of a fear of falling?  No      Home Environment   Living Environment Private residence      Observation/Other Assessments   Other Surveys  Modified Oswestry    Modified Oswestry 19 / 50 = 38.0 %      Sensation   Light Touch Appears Intact      Functional Tests   Functional tests Other      Other:   Other/ Comments TUG : 25 sec - staggered stance to turn around cone; near LOB  - revoceverd indep      Posture/Postural Control   Posture/Postural Control Postural limitations    Postural Limitations Rounded Shoulders;Forward head;Flexed trunk;Weight shift left      ROM / Strength   AROM / PROM / Strength AROM;Strength      AROM   Overall AROM  Deficits;Due to pain    AROM Assessment Site Hip    Right/Left Hip Right;Left      Strength   Overall Strength Deficits;Due to pain    Strength Assessment Site Hip;Knee    Right/Left Hip Right;Left    Right Hip Flexion 3/5    Right Hip ABduction 3-/5    Left Hip Flexion 3/5    Left Hip ABduction 3-/5    Right/Left Knee Right;Left    Right Knee Flexion 3/5    Right Knee Extension 5/5    Left Knee Flexion 3/5    Left Knee Extension 4-/5      Flexibility   Soft Tissue Assessment /Muscle Length yes    Hamstrings limited Bil with pain in the low back when assessed.      Special Tests    Special Tests Lumbar    Lumbar Tests Slump Test;FABER test      FABER test   findings Positive    Side Right    Comment pain in back and hip      Slump test   Findings Positive    Comment bil      Bed Mobility   Bed Mobility Rolling Right;Rolling Left    Rolling Right Supervision/verbal cueing    Rolling Left Supervision/Verbal cueing      Transfers   Transfers Sit to Stand    Sit to Stand 6: Modified independent (Device/Increase time)    Comments 30 sec sit to stand : 7 times      Ambulation/Gait   Ambulation/Gait Yes    Ambulation/Gait Assistance 6: Modified independent (Device/Increase time)    Gait Pattern Decreased arm swing - right;Step-through pattern;Decreased stride length;Antalgic                        Objective measurements completed on examination: See above findings.       [facility] Adult PT Treatment/Exercise - 08/23/20 0001       Exercises   Exercises Other Exercises    Other Exercises  primal push up                    PT Education -  08/23/20 1143     Education  Details patient educated to do the primal push up in quadruped    Person(s) Educated Patient    Methods Demonstration;Explanation    Comprehension Verbalized understanding;Returned demonstration              PT Short Term Goals - 08/23/20 1148       PT SHORT TERM GOAL #1   Title Indep with initial HEP    Time 2    Period Weeks    Status New    Target Date 09/06/20               PT Long Term Goals - 08/23/20 1149       PT LONG TERM GOAL #1   Title Indep with proressive HEP    Time 6    Period Weeks    Status New    Target Date 10/04/20      PT LONG TERM GOAL #2   Title Patient will be able to demonstrate indpenedent log rolling for bed mobility.    Baseline supervision - pain    Time 3    Period Weeks    Status New    Target Date 09/13/20      PT LONG TERM GOAL #3   Title Patient will be able to demonstrate improved TUG to less than 15sec to indicate improved fred mobility    Time 6    Period Weeks    Status New    Target Date 10/04/20      PT LONG TERM GOAL #4   Title Patient will demonstrates greater than 12 sit to stands in 30seconds to indicate improved endurance and ease of transfers.    Time 6    Period Weeks    Status New    Target Date 10/04/20      PT LONG TERM GOAL #5   Title Patient will demonstrates greater than 4/5 LE strength bilaterally.    Time 6    Period Weeks    Status New    Target Date 10/04/20                    Plan - 08/23/20 1144     Clinical Impression Statement Antonio Lucas is a pleasant 65 yo male with extensive history for cervical and lumbar spondylosis. Due to history of radicular symptoms, unresolved, he has a lumbar stimulator he reoport is at T7. He reports a significant history of falls with a previous episode that resulted in a head contusion. Patient demonstrates antalgic gait with impairments associated with weakness in the RLE. Pt recommended to encourage gait and balance improvement to promote  safety with ADLs and transfers.    Personal Factors and Comorbidities Comorbidity 1;Comorbidity 2;Comorbidity 3+;Past/Current Experience;Time since onset of injury/illness/exacerbation    Examination-Activity Limitations Transfers;Squat;Bend;Locomotion Level    Examination-Participation Restrictions Occupation;Community Activity    Stability/Clinical Decision Making Unstable/Unpredictable    Clinical Decision Making Moderate    Rehab Potential Fair    PT Frequency 2x / week    PT Duration 6 weeks    PT Treatment/Interventions ADLs/Self Care Home Management;Therapeutic activities;Therapeutic exercise;Balance training;Neuromuscular re-education;Manual techniques    PT Home Exercise Plan see pt instructions    Recommended Other Services May be appropriate for aquatic therapy    Consulted and Agree with Plan of Care Patient             Patient will benefit from skilled therapeutic intervention in order to improve the  following deficits and impairments:  Abnormal gait, Decreased activity tolerance, Decreased endurance, Decreased strength, Improper body mechanics, Pain, Postural dysfunction, Difficulty walking, Decreased mobility, Decreased coordination, Decreased balance, Decreased range of motion  Visit Diagnosis: Lumbar spondylosis - Plan: PT plan of care cert/re-cert  Difficulty in walking, not elsewhere classified - Plan: PT plan of care cert/re-cert  Repeated falls - Plan: PT plan of care cert/re-cert  Muscle weakness (generalized) - Plan: PT plan of care cert/re-cert     Problem List Patient Active Problem List   Diagnosis Date Noted   Essential hypertension    Unstable angina (HCC)    Morbid obesity (HCC) 12/27/2011   Edema 12/27/2011   GERD (gastroesophageal reflux disease) 12/23/2011   GI bleed 12/23/2011   Hypertension 12/23/2011   High cholesterol 12/23/2011    Levonne Spiller PT, DPT 08/23/2020, 11:59 AM  [facility] [address] [place], [place], [address] Phone: [phone]   Fax:  [phone]  Name: Antonio Lucas MRN: [id number] Date of Birth: August 08, 1955

## 2020-08-28 ENCOUNTER — Ambulatory Visit: Payer: Medicare Other

## 2020-08-28 ENCOUNTER — Other Ambulatory Visit: Payer: Self-pay

## 2020-08-28 DIAGNOSIS — M47816 Spondylosis without myelopathy or radiculopathy, lumbar region: Secondary | ICD-10-CM

## 2020-08-28 DIAGNOSIS — R296 Repeated falls: Secondary | ICD-10-CM

## 2020-08-28 DIAGNOSIS — R262 Difficulty in walking, not elsewhere classified: Secondary | ICD-10-CM

## 2020-08-28 DIAGNOSIS — M6281 Muscle weakness (generalized): Secondary | ICD-10-CM

## 2020-08-28 NOTE — Therapy (Signed)
[facility] [address] [place], [place], [address] Phone: [phone]   Fax:  [phone]  Physical Therapy Treatment  Patient Details  Name: Antonio Lucas MRN: [id number] Date of Birth: 1955/07/12 Referring Provider (PT): Max Noel Gerold   Encounter Date: 08/28/2020   PT End of Session - 08/28/20 1131     Visit Number 2    Number of Visits 12    Date for PT Re-Evaluation 10/04/20    PT Start Time 0815    PT Stop Time 0905    PT Time Calculation (min) 50 min    Activity Tolerance Patient tolerated treatment well;Patient limited by pain    Behavior During Therapy [facility] for tasks assessed/performed             Past Medical History:  Diagnosis Date   Arthritis    COVID-19    GERD (gastroesophageal reflux disease)    Hyperlipemia    Hypertension    Pneumonia 1995    Past Surgical History:  Procedure Laterality Date   ANTERIOR CERVICAL DECOMP/DISCECTOMY FUSION     C3-C4   BACK SURGERY     02.2011- HE HAD SOME NECK PROBLEMS WITH C7 .   CARDIAC CATHETERIZATION N/A 12/04/2014   Procedure: Left Heart Cath and Coronary Angiography;  Surgeon: Lennette Bihari, MD;  [facility];  Service: Cardiovascular;  Laterality: N/A;   CARPAL TUNNEL RELEASE     both arms   CHOLECYSTECTOMY     COLON SURGERY     COLONOSCOPY  02/16/2012   Procedure: COLONOSCOPY;  Surgeon: Malissa Hippo, MD;  [facility];  Service: Endoscopy;  Laterality: N/A;  730   ESOPHAGOGASTRODUODENOSCOPY N/A 12/14/2013   Procedure: ESOPHAGOGASTRODUODENOSCOPY (EGD);  Surgeon: Malissa Hippo, MD;  [facility];  Service: Endoscopy;  Laterality: N/A;  1030   ESOPHAGOGASTRODUODENOSCOPY (EGD) WITH ESOPHAGEAL DILATION  01/07/2012   Procedure: ESOPHAGOGASTRODUODENOSCOPY (EGD) WITH ESOPHAGEAL DILATION;  Surgeon: Malissa Hippo, MD;  [facility];  Service: Endoscopy;  Laterality: N/A;  145   MALONEY DILATION N/A 12/14/2013    Procedure: Elease Hashimoto DILATION;  Surgeon: Malissa Hippo, MD;  [facility];  Service: Endoscopy;  Laterality: N/A;   TONSILLECTOMY      There were no vitals filed for this visit.   Subjective Assessment - 08/28/20 0824     Subjective COVID-19 screening performed upon arrival. Patient states that he had a pretty good day after last session. He states that standing for 5-17minutes becomes uncomfortable. He says he knows he is ready to sit down after the first verse of a song due to the pain in his back. Patient states that he was happy that he was able to walk a few blocks with less pain than normal. He wants written handout for HEP as he is concerened that he has forgotten some.    Pertinent History vertiflex, lumbar stimulator, previous laminectiomy, bulging discs, ACDF    How long can you sit comfortably? in a recliner - for quite a while    How long can you stand comfortably? less than 5 mins at most 10 minutes    How long can you walk comfortably? a few blocks    Patient Stated Goals To be able to get around with less pain in the back and less frequency of falls.    Currently in Pain? Yes    Pain Score 3     Pain Location Back  Pain Orientation Lower    Pain Onset More than a month ago    Pain Frequency Several days a week    Aggravating Factors  standing in one place    Pain Relieving Factors rest, walking    Effect of Pain on Daily Activities "slows me down"                               [facility] Adult PT Treatment/Exercise - 08/28/20 0822       Transfers   Transfers Sit to Stand    Sit to Stand 6: Modified independent (Device/Increase time)      Ambulation/Gait   Gait Pattern Decreased arm swing - right;Step-through pattern;Decreased stride length;Antalgic    Ambulation Surface Level      Posture/Postural Control   Posture/Postural Control Postural limitations    Postural Limitations Rounded Shoulders;Forward head;Flexed trunk;Weight shift  left      Exercises   Exercises Other Exercises;Lumbar    Other Exercises  primal push up      Lumbar Exercises: Stretches   Active Hamstring Stretch Limitations seated HS stretch 3 x 20 sec    Figure 4 Stretch Limitations seated 3 x 20 sec    Other Lumbar Stretch Exercise seated physioball roll forwards/laterals    Other Lumbar Stretch Exercise standing physioball roll up the wall      Lumbar Exercises: Standing   Other Standing Lumbar Exercises standing press down 1 x 10 ; standing press down wht knee drives to the ball x 10 eac      Lumbar Exercises: Seated   Other Seated Lumbar Exercises seated press down of physioball for core strengthl      Lumbar Exercises: Quadruped   Other Quadruped Lumbar Exercises primal push up - knees raised off of the table  in quad 3 x 5 (progressed to 8-10 at end of session)      Manual Therapy   Manual Therapy Joint mobilization;Soft tissue mobilization    Joint Mobilization CPA gr II into the R innominate    Soft tissue mobilization STM to the lumbar paraspinals and glut med region                      PT Short Term Goals - 08/23/20 1148       PT SHORT TERM GOAL #1   Title Indep with initial HEP    Time 2    Period Weeks    Status New    Target Date 09/06/20               PT Long Term Goals - 08/23/20 1149       PT LONG TERM GOAL #1   Title Indep with proressive HEP    Time 6    Period Weeks    Status New    Target Date 10/04/20      PT LONG TERM GOAL #2   Title Patient will be able to demonstrate indpenedent log rolling for bed mobility.    Baseline supervision - pain    Time 3    Period Weeks    Status New    Target Date 09/13/20      PT LONG TERM GOAL #3   Title Patient will be able to demonstrate improved TUG to less than 15sec to indicate improved fred mobility    Time 6    Period Weeks    Status New  Target Date 10/04/20      PT LONG TERM GOAL #4   Title Patient will demonstrates greater than  12 sit to stands in 30seconds to indicate improved endurance and ease of transfers.    Time 6    Period Weeks    Status New    Target Date 10/04/20      PT LONG TERM GOAL #5   Title Patient will demonstrates greater than 4/5 LE strength bilaterally.    Time 6    Period Weeks    Status New    Target Date 10/04/20                   Plan - 08/28/20 1136     Clinical Impression Statement Patient arrives to clinic with minimal irritability of symptoms today . He was able to participate in PT this session with reports of decreased pain at the lumbar and hip region. Patient benefits from core strength activities and will also participate n posterior chain strenght to reduce strain in lumbar spine. Skilled PT recommended to continue to reduce patient risk for falls. Disccussed with patient the option of possible external brace for LE if continued LE buckling and weakness occur.    Personal Factors and Comorbidities Comorbidity 1;Comorbidity 2;Comorbidity 3+;Past/Current Experience;Time since onset of injury/illness/exacerbation    Examination-Activity Limitations Transfers;Squat;Bend;Locomotion Level    Examination-Participation Restrictions Occupation;Community Activity    Stability/Clinical Decision Making Unstable/Unpredictable    Clinical Decision Making Moderate    Rehab Potential Fair    PT Frequency 2x / week    PT Duration 6 weeks    PT Treatment/Interventions ADLs/Self Care Home Management;Therapeutic activities;Therapeutic exercise;Balance training;Neuromuscular re-education;Manual techniques    Consulted and Agree with Plan of Care Patient             Patient will benefit from skilled therapeutic intervention in order to improve the following deficits and impairments:  Abnormal gait, Decreased activity tolerance, Decreased endurance, Decreased strength, Improper body mechanics, Pain, Postural dysfunction, Difficulty walking, Decreased mobility, Decreased coordination,  Decreased balance, Decreased range of motion  Visit Diagnosis: Lumbar spondylosis  Difficulty in walking, not elsewhere classified  Repeated falls  Muscle weakness (generalized)     Problem List Patient Active Problem List   Diagnosis Date Noted   Essential hypertension    Unstable angina (HCC)    Morbid obesity (HCC) 12/27/2011   Edema 12/27/2011   GERD (gastroesophageal reflux disease) 12/23/2011   GI bleed 12/23/2011   Hypertension 12/23/2011   High cholesterol 12/23/2011    Levonne Spiller 08/28/2020, 11:40 AM  [facility] [address] [place], [place], [address] Phone: [phone]   Fax:  [phone]  Name: Antonio Lucas MRN: [id number] Date of Birth: 03-Nov-1955

## 2020-08-30 ENCOUNTER — Ambulatory Visit: Payer: Medicare Other

## 2020-08-30 ENCOUNTER — Other Ambulatory Visit: Payer: Self-pay

## 2020-08-30 DIAGNOSIS — M6281 Muscle weakness (generalized): Secondary | ICD-10-CM

## 2020-08-30 DIAGNOSIS — R296 Repeated falls: Secondary | ICD-10-CM

## 2020-08-30 DIAGNOSIS — M47816 Spondylosis without myelopathy or radiculopathy, lumbar region: Secondary | ICD-10-CM

## 2020-08-30 DIAGNOSIS — R262 Difficulty in walking, not elsewhere classified: Secondary | ICD-10-CM

## 2020-08-30 NOTE — Therapy (Signed)
[facility] [address] [place], [place], [address] Phone: [phone]   Fax:  [phone]  Physical Therapy Treatment  Patient Details  Name: Antonio Lucas MRN: [id number] Date of Birth: August 05, 1955 Referring Provider (PT): Max Noel Gerold   Encounter Date: 08/30/2020   PT End of Session - 08/30/20 1600     Visit Number 3    Number of Visits 12    Date for PT Re-Evaluation 10/04/20    PT Start Time 0815    PT Stop Time 0903    PT Time Calculation (min) 48 min    Activity Tolerance Patient tolerated treatment well    Behavior During Therapy [facility] for tasks assessed/performed             Past Medical History:  Diagnosis Date   Arthritis    COVID-19    GERD (gastroesophageal reflux disease)    Hyperlipemia    Hypertension    Pneumonia 1995    Past Surgical History:  Procedure Laterality Date   ANTERIOR CERVICAL DECOMP/DISCECTOMY FUSION     C3-C4   BACK SURGERY     02.2011- HE HAD SOME NECK PROBLEMS WITH C7 .   CARDIAC CATHETERIZATION N/A 12/04/2014   Procedure: Left Heart Cath and Coronary Angiography;  Surgeon: Lennette Bihari, MD;  [facility];  Service: Cardiovascular;  Laterality: N/A;   CARPAL TUNNEL RELEASE     both arms   CHOLECYSTECTOMY     COLON SURGERY     COLONOSCOPY  02/16/2012   Procedure: COLONOSCOPY;  Surgeon: Malissa Hippo, MD;  [facility];  Service: Endoscopy;  Laterality: N/A;  730   ESOPHAGOGASTRODUODENOSCOPY N/A 12/14/2013   Procedure: ESOPHAGOGASTRODUODENOSCOPY (EGD);  Surgeon: Malissa Hippo, MD;  [facility];  Service: Endoscopy;  Laterality: N/A;  1030   ESOPHAGOGASTRODUODENOSCOPY (EGD) WITH ESOPHAGEAL DILATION  01/07/2012   Procedure: ESOPHAGOGASTRODUODENOSCOPY (EGD) WITH ESOPHAGEAL DILATION;  Surgeon: Malissa Hippo, MD;  [facility];  Service: Endoscopy;  Laterality: N/A;  145   MALONEY DILATION N/A 12/14/2013   Procedure: Elease Hashimoto  DILATION;  Surgeon: Malissa Hippo, MD;  [facility];  Service: Endoscopy;  Laterality: N/A;   TONSILLECTOMY      There were no vitals filed for this visit.   Subjective Assessment - 08/30/20 1546     Subjective COVID-19 screening performed upon arrival. Patient states that he is having a tough time today. He was a little sore after last visit. He sdenies recent falls. 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    Currently in Pain? Yes    Pain Score 4     Pain Location Back                               [facility] Adult PT Treatment/Exercise - 08/30/20 0815       Ambulation/Gait   Ambulation/Gait Yes    Ambulation/Gait Assistance 6: Modified independent (Device/Increase time)    Ambulation Distance (Feet) 230 Feet    Assistive device Straight cane  Gait Pattern Step-to pattern    Ambulation Surface Level    Gait velocity slow and guarded    Gait Comments 2 LOB and self recovery with UE at the wall      Therapeutic Activites    Therapeutic Activities Other Therapeutic Activities   Education and discussion of possible external orthotic support for preventing falls     Exercises   Exercises Other Exercises;Lumbar      Lumbar Exercises: Stretches   Active Hamstring Stretch Limitations seated HS stretch 3 x 20 sec    Figure 4 Stretch Limitations seated 3 x 20 sec      Lumbar Exercises: Aerobic   Tread Mill 2 mins   diminished gait stance - LE buckling and limited stepping tolerance noted ( discontinued and ambulate level surface in clinic)     Lumbar Exercises: Machines for Strengthening   Leg Press approx 3  mins - (1 min without resistance; eccentric control to knee and hip flexion; 2 mins on lvl 2 with emphasis on strength of R quad)      Lumbar Exercises: Standing   Other Standing Lumbar Exercises latisimus dorsi stretch with elbows to wall    Other Standing Lumbar Exercises press down into physioball with single leg knee drive to physioball - PT blockng at R knee      Lumbar Exercises: Seated   Other Seated Lumbar Exercises seated press down physioball      Lumbar Exercises: Quadruped   Other Quadruped Lumbar Exercises primal push up - knees raised off of the table - approx 2 x 10 reps                    PT Education - 08/30/20 1557     Education Details [email or web address]   - handout provided    Person(s) Educated Patient    Methods Explanation    Comprehension Verbalized understanding              PT Short Term Goals - 08/23/20 1148       PT SHORT TERM GOAL #1   Title Indep with initial HEP    Time 2    Period Weeks    Status New    Target Date 09/06/20               PT Long Term Goals - 08/23/20 1149       PT LONG TERM GOAL #1   Title Indep with proressive HEP    Time 6    Period Weeks    Status New    Target Date 10/04/20      PT LONG TERM GOAL #2   Title Patient will be able to demonstrate indpenedent log rolling for bed mobility.    Baseline supervision - pain    Time 3    Period Weeks    Status New    Target Date 09/13/20      PT LONG TERM GOAL #3   Title Patient will be able to demonstrate improved TUG to less than 15sec to indicate improved fred mobility    Time 6    Period Weeks    Status New    Target Date 10/04/20      PT LONG TERM GOAL #4   Title Patient will demonstrates greater than 12 sit to stands in 30seconds to indicate improved endurance and ease of transfers.    Time 6    Period Weeks    Status New    Target  Date 10/04/20      PT LONG TERM GOAL #5   Title  Patient will demonstrates greater than 4/5 LE strength bilaterally.    Time 6    Period Weeks    Status New    Target Date 10/04/20                   Plan - 08/30/20 1600     Clinical Impression Statement Patient with limited participation due to fear of falling. He has been doing well with strengthening and independence with his self management of symptoms. However, patient symptoms moderately irritable and demonstrates 2 LOB with recovery (no falls) this visit. Patien ambulates with SPC well throughout clinic otherwise. Unable to tolerate treadmill walking safely thus deffered. Patient does well with leg press strengthening this visit. Skilled PT recommended to continue.    Personal Factors and Comorbidities Comorbidity 1;Comorbidity 2;Comorbidity 3+;Past/Current Experience;Time since onset of injury/illness/exacerbation    Examination-Activity Limitations Transfers;Squat;Bend;Locomotion Level    Examination-Participation Restrictions Occupation;Community Activity    Stability/Clinical Decision Making Unstable/Unpredictable    Clinical Decision Making Moderate    Rehab Potential Fair    PT Frequency 2x / week    PT Duration 6 weeks    PT Treatment/Interventions ADLs/Self Care Home Management;Therapeutic activities;Therapeutic exercise;Balance training;Neuromuscular re-education;Manual techniques    PT Home Exercise Plan see pt instructions    Consulted and Agree with Plan of Care Patient             Patient will benefit from skilled therapeutic intervention in order to improve the following deficits and impairments:  Abnormal gait, Decreased activity tolerance, Decreased endurance, Decreased strength, Improper body mechanics, Pain, Postural dysfunction, Difficulty walking, Decreased mobility, Decreased coordination, Decreased balance, Decreased range of motion  Visit Diagnosis: Lumbar spondylosis  Difficulty in walking, not elsewhere classified  Repeated falls  Muscle  weakness (generalized)     Problem List Patient Active Problem List   Diagnosis Date Noted   Essential hypertension    Unstable angina (HCC)    Morbid obesity (HCC) 12/27/2011   Edema 12/27/2011   GERD (gastroesophageal reflux disease) 12/23/2011   GI bleed 12/23/2011   Hypertension 12/23/2011   High cholesterol 12/23/2011    Levonne Spiller 08/30/2020, 4:05 PM  [facility] [address] [place], [place], [address] Phone: [phone]   Fax:  [phone]  Name: Antonio Lucas MRN: [id number] Date of Birth: [birth year]

## 2020-09-04 ENCOUNTER — Ambulatory Visit: Payer: Medicare Other

## 2020-09-06 ENCOUNTER — Other Ambulatory Visit: Payer: Self-pay

## 2020-09-06 ENCOUNTER — Ambulatory Visit: Payer: Medicare Other | Attending: Orthopaedic Surgery

## 2020-09-06 DIAGNOSIS — M6281 Muscle weakness (generalized): Secondary | ICD-10-CM | POA: Diagnosis present

## 2020-09-06 DIAGNOSIS — M47816 Spondylosis without myelopathy or radiculopathy, lumbar region: Secondary | ICD-10-CM | POA: Insufficient documentation

## 2020-09-06 DIAGNOSIS — R296 Repeated falls: Secondary | ICD-10-CM | POA: Diagnosis present

## 2020-09-06 DIAGNOSIS — R262 Difficulty in walking, not elsewhere classified: Secondary | ICD-10-CM | POA: Insufficient documentation

## 2020-09-06 NOTE — Therapy (Signed)
[facility] [address] [place], [place], [address] Phone: [phone]   Fax:  [phone]  Physical Therapy Treatment  Patient Details  Name: Antonio Lucas MRN: [id number] Date of Birth: [birth year] Referring Provider (PT): Max Noel Gerold   Encounter Date: 09/06/2020   PT End of Session - 09/06/20 1325     Visit Number 4    Number of Visits 12    Date for PT Re-Evaluation 10/04/20    PT Start Time 1031    PT Stop Time 1130    PT Time Calculation (min) 59 min    Activity Tolerance Patient tolerated treatment well    Behavior During Therapy [facility] for tasks assessed/performed             Past Medical History:  Diagnosis Date   Arthritis    COVID-19    GERD (gastroesophageal reflux disease)    Hyperlipemia    Hypertension    Pneumonia 1995    Past Surgical History:  Procedure Laterality Date   ANTERIOR CERVICAL DECOMP/DISCECTOMY FUSION     C3-C4   BACK SURGERY     02.2011- HE HAD SOME NECK PROBLEMS WITH C7 .   CARDIAC CATHETERIZATION N/A 12/04/2014   Procedure: Left Heart Cath and Coronary Angiography;  Surgeon: Lennette Bihari, MD;  [facility];  Service: Cardiovascular;  Laterality: N/A;   CARPAL TUNNEL RELEASE     both arms   CHOLECYSTECTOMY     COLON SURGERY     COLONOSCOPY  02/16/2012   Procedure: COLONOSCOPY;  Surgeon: Malissa Hippo, MD;  [facility];  Service: Endoscopy;  Laterality: N/A;  730   ESOPHAGOGASTRODUODENOSCOPY N/A 12/14/2013   Procedure: ESOPHAGOGASTRODUODENOSCOPY (EGD);  Surgeon: Malissa Hippo, MD;  [facility];  Service: Endoscopy;  Laterality: N/A;  1030   ESOPHAGOGASTRODUODENOSCOPY (EGD) WITH ESOPHAGEAL DILATION  01/07/2012   Procedure: ESOPHAGOGASTRODUODENOSCOPY (EGD) WITH ESOPHAGEAL DILATION;  Surgeon: Malissa Hippo, MD;  [facility];  Service: Endoscopy;  Laterality: N/A;  145   MALONEY DILATION N/A 12/14/2013   Procedure: Elease Hashimoto  DILATION;  Surgeon: Malissa Hippo, MD;  [facility];  Service: Endoscopy;  Laterality: N/A;   TONSILLECTOMY      There were no vitals filed for this visit.   Subjective Assessment - 09/06/20 1318     Subjective COVID-19 screening performed upon arrival. Patient states that he has been having a few great days in a row with no assistive device. (He was instructed that for safety, it is greatly recommended that he continues to use a cane). He states that he is willing and prepared to meet with orthotist and representative for candidacy for fitting into a custom orthoses that would reduce his risk for falls.    Pertinent History vertiflex, lumbar stimulator, previous laminectiomy, bulging discs, ACDF    Limitations Lifting;Walking    Patient Stated Goals To be able to get around with less pain in the back and less frequency of falls.    Currently in Pain? --   using stimulator (3-combo) this session                              [facility] Adult PT Treatment/Exercise - 09/06/20 0001       Therapeutic Activites    Therapeutic Activities Other Therapeutic Activities    Other Therapeutic Activities Assessment and trial of [employer] C-Brace from representative and liscensed  orthotist. Gait assessment and training with device. Hip and core strength review of nfluence                      PT Short Term Goals - 08/23/20 1148       PT SHORT TERM GOAL #1   Title Indep with initial HEP    Time 2    Period Weeks    Status New    Target Date 09/06/20               PT Long Term Goals - 08/23/20 1149       PT LONG TERM GOAL #1   Title Indep with proressive HEP    Time 6    Period Weeks    Status New    Target Date 10/04/20      PT LONG TERM GOAL #2   Title Patient will be able to demonstrate indpenedent log rolling for bed mobility.    Baseline supervision - pain    Time 3    Period Weeks    Status New    Target Date 09/13/20      PT  LONG TERM GOAL #3   Title Patient will be able to demonstrate improved TUG to less than 15sec to indicate improved fred mobility    Time 6    Period Weeks    Status New    Target Date 10/04/20      PT LONG TERM GOAL #4   Title Patient will demonstrates greater than 12 sit to stands in 30seconds to indicate improved endurance and ease of transfers.    Time 6    Period Weeks    Status New    Target Date 10/04/20      PT LONG TERM GOAL #5   Title Patient will demonstrates greater than 4/5 LE strength bilaterally.    Time 6    Period Weeks    Status New    Target Date 10/04/20                   Plan - 09/06/20 1328     Clinical Impression Statement Today's therapy session included visit from orthotisit and representative of [employer] for orthoses recommendations. Gait aseesesment with and without device was provided. Patient with noted weaker R quad strength indicated by limited terminal knee extension with push off at terminal stance. He initiates swing phase with imprved quad control with use of trial brace. . During use of trial brace, patient was able to accomplish terminal knee extension, hip extension, and controlled hipknee flexion to ambulate with progressively increased equal weightbearing. He reports feeling safer and independent with the use of the device. Emphasis on continued strengthening and reactionary response training to improve gait and balance discussed. Plan to continue skilled PT at this time.    Personal Factors and Comorbidities Comorbidity 1;Comorbidity 2;Comorbidity 3+;Past/Current Experience;Time since onset of injury/illness/exacerbation    Examination-Activity Limitations Transfers;Squat;Bend;Locomotion Level    Examination-Participation Restrictions Occupation;Community Activity    Stability/Clinical Decision Making Unstable/Unpredictable    Clinical Decision Making Moderate    Rehab Potential Fair    PT Frequency 2x / week    PT Duration 6 weeks     PT Treatment/Interventions ADLs/Self Care Home Management;Therapeutic activities;Therapeutic exercise;Balance training;Neuromuscular re-education;Manual techniques             Patient will benefit from skilled therapeutic intervention in order to improve the following deficits and impairments:  Abnormal gait, Decreased activity tolerance,  Decreased endurance, Decreased strength, Improper body mechanics, Pain, Postural dysfunction, Difficulty walking, Decreased mobility, Decreased coordination, Decreased balance, Decreased range of motion  Visit Diagnosis: Lumbar spondylosis  Difficulty in walking, not elsewhere classified  Repeated falls  Muscle weakness (generalized)     Problem List Patient Active Problem List   Diagnosis Date Noted   Essential hypertension    Unstable angina (HCC)    Morbid obesity (HCC) 12/27/2011   Edema 12/27/2011   GERD (gastroesophageal reflux disease) 12/23/2011   GI bleed 12/23/2011   Hypertension 12/23/2011   High cholesterol 12/23/2011    Levonne Spiller  PT, DPT 09/06/2020, 1:39 PM  [facility] [address] [place], [place], [address] Phone: [phone]   Fax:  [phone]  Name: DUELL HOLDREN MRN: [id number] Date of Birth: Jul 22, 1955

## 2020-09-11 ENCOUNTER — Other Ambulatory Visit: Payer: Self-pay

## 2020-09-11 ENCOUNTER — Ambulatory Visit: Payer: Medicare Other

## 2020-09-11 DIAGNOSIS — M6281 Muscle weakness (generalized): Secondary | ICD-10-CM

## 2020-09-11 DIAGNOSIS — M47816 Spondylosis without myelopathy or radiculopathy, lumbar region: Secondary | ICD-10-CM | POA: Diagnosis not present

## 2020-09-11 DIAGNOSIS — R262 Difficulty in walking, not elsewhere classified: Secondary | ICD-10-CM

## 2020-09-11 DIAGNOSIS — R296 Repeated falls: Secondary | ICD-10-CM

## 2020-09-11 NOTE — Therapy (Signed)
[facility] [address] [place], [place], [address] Phone: [phone]   Fax:  [phone]  Physical Therapy Treatment  Patient Details  Name: Antonio Lucas MRN: [id number] Date of Birth: 15-Jul-1955 Referring Provider (PT): Max Noel Gerold   Encounter Date: 09/11/2020   PT End of Session - 09/11/20 1702     Visit Number 5    Number of Visits 12    Date for PT Re-Evaluation 10/04/20    PT Start Time 0815    PT Stop Time 0900    PT Time Calculation (min) 45 min             Past Medical History:  Diagnosis Date   Arthritis    COVID-19    GERD (gastroesophageal reflux disease)    Hyperlipemia    Hypertension    Pneumonia 1995    Past Surgical History:  Procedure Laterality Date   ANTERIOR CERVICAL DECOMP/DISCECTOMY FUSION     C3-C4   BACK SURGERY     02.2011- HE HAD SOME NECK PROBLEMS WITH C7 .   CARDIAC CATHETERIZATION N/A 12/04/2014   Procedure: Left Heart Cath and Coronary Angiography;  Surgeon: Lennette Bihari, MD;  [facility];  Service: Cardiovascular;  Laterality: N/A;   CARPAL TUNNEL RELEASE     both arms   CHOLECYSTECTOMY     COLON SURGERY     COLONOSCOPY  02/16/2012   Procedure: COLONOSCOPY;  Surgeon: Malissa Hippo, MD;  [facility];  Service: Endoscopy;  Laterality: N/A;  730   ESOPHAGOGASTRODUODENOSCOPY N/A 12/14/2013   Procedure: ESOPHAGOGASTRODUODENOSCOPY (EGD);  Surgeon: Malissa Hippo, MD;  [facility];  Service: Endoscopy;  Laterality: N/A;  1030   ESOPHAGOGASTRODUODENOSCOPY (EGD) WITH ESOPHAGEAL DILATION  01/07/2012   Procedure: ESOPHAGOGASTRODUODENOSCOPY (EGD) WITH ESOPHAGEAL DILATION;  Surgeon: Malissa Hippo, MD;  [facility];  Service: Endoscopy;  Laterality: N/A;  145   MALONEY DILATION N/A 12/14/2013   Procedure: Elease Hashimoto DILATION;  Surgeon: Malissa Hippo, MD;  [facility];  Service: Endoscopy;  Laterality: N/A;   TONSILLECTOMY       There were no vitals filed for this visit.   Subjective Assessment - 09/11/20 0825     Subjective COVID-19 screening performed upon arrival. Patient reports that he has been feleing a bit low today. He was told that he does not look like he is doing well by family/friends. He reports he is prepared for PT.    Pertinent History vertiflex, lumbar stimulator, previous laminectiomy, bulging discs, ACDF    Limitations Lifting;Walking    How long can you sit comfortably? in a recliner - for quite a while    How long can you stand comfortably? less than 5 mins at most 10 minutes    How long can you walk comfortably? a few blocks    Patient Stated Goals To be able to get around with less pain in the back and less frequency of falls.    Currently in Pain? Yes    Pain Score 4     Pain Location Back    Pain Orientation Lower                               [facility] Adult PT Treatment/Exercise - 09/11/20 0825       Transfers   Transfers Sit to Stand    Comments with red tband at the knees;  and 10# at the core      Exercises   Exercises Other Exercises;Lumbar    Other Exercises  quadruped bird dog, qudrpd LE extension; supine/hooklying press down (hold 10 sec)      Lumbar Exercises: Stretches   Active Hamstring Stretch Limitations seated HS stretch 3 x 20 sec    Quad Stretch Limitations childs pose per tolerance      Lumbar Exercises: Aerobic   Nustep 10 mins ; lvl 3      Lumbar Exercises: Seated   Other Seated Lumbar Exercises seated press down physioball      Lumbar Exercises: Supine   Other Supine Lumbar Exercises physioball press down to the knees in hooklying (attempted leg elevation but unable t oaccomplish without back pain      Lumbar Exercises: Quadruped   Opposite Arm/Leg Raise Left arm/Right leg;Right arm/Left leg;10 reps    Other Quadruped Lumbar Exercises primal push up - knees raised off of the table - approx 2 x 10 reps                       PT Short Term Goals - 08/23/20 1148       PT SHORT TERM GOAL #1   Title Indep with initial HEP    Time 2    Period Weeks    Status New    Target Date 09/06/20               PT Long Term Goals - 08/23/20 1149       PT LONG TERM GOAL #1   Title Indep with proressive HEP    Time 6    Period Weeks    Status New    Target Date 10/04/20      PT LONG TERM GOAL #2   Title Patient will be able to demonstrate indpenedent log rolling for bed mobility.    Baseline supervision - pain    Time 3    Period Weeks    Status New    Target Date 09/13/20      PT LONG TERM GOAL #3   Title Patient will be able to demonstrate improved TUG to less than 15sec to indicate improved fred mobility    Time 6    Period Weeks    Status New    Target Date 10/04/20      PT LONG TERM GOAL #4   Title Patient will demonstrates greater than 12 sit to stands in 30seconds to indicate improved endurance and ease of transfers.    Time 6    Period Weeks    Status New    Target Date 10/04/20      PT LONG TERM GOAL #5   Title Patient will demonstrates greater than 4/5 LE strength bilaterally.    Time 6    Period Weeks    Status New    Target Date 10/04/20                   Plan - 09/11/20 1703     Clinical Impression Statement Antonio Lucas participated with excellent effort and endurnace with this session. He was able to progress a series of core strength this visit without pain. He ambulates without an AD (he states that he kept it in the car) . Patient was educated and strongly encouraged by therapist to carry cane regardless of the distance given the unprediictable nature related to the risk of falls. He demonstrates indep with gait intermittently  at end of session performing a furniture walk assist. No falls nor increased pain occuring this visit. Skilled PT recommended to continue per plan of care to encourage greater core and proximal hip strength to encourage offloading  lumbar spine. He performs glut med isolated sit  to stands with red tband and 10# at heart center without difficulty. Progression to be encouraged next visit.    Personal Factors and Comorbidities Comorbidity 1;Comorbidity 2;Comorbidity 3+;Past/Current Experience;Time since onset of injury/illness/exacerbation    Examination-Activity Limitations Transfers;Squat;Bend;Locomotion Level    Examination-Participation Restrictions Occupation;Community Activity    Stability/Clinical Decision Making Unstable/Unpredictable    Clinical Decision Making Moderate    Rehab Potential Fair    PT Frequency 2x / week    PT Duration 6 weeks    PT Treatment/Interventions ADLs/Self Care Home Management;Therapeutic activities;Therapeutic exercise;Balance training;Neuromuscular re-education;Manual techniques    PT Next Visit Plan lateral walking with resistnace band - single leg step ups (in // bars) ;    Consulted and Agree with Plan of Care Patient             Patient will benefit from skilled therapeutic intervention in order to improve the following deficits and impairments:  Abnormal gait, Decreased activity tolerance, Decreased endurance, Decreased strength, Improper body mechanics, Pain, Postural dysfunction, Difficulty walking, Decreased mobility, Decreased coordination, Decreased balance, Decreased range of motion  Visit Diagnosis: Lumbar spondylosis  Difficulty in walking, not elsewhere classified  Repeated falls  Muscle weakness (generalized)     Problem List Patient Active Problem List   Diagnosis Date Noted   Essential hypertension    Unstable angina (HCC)    Morbid obesity (HCC) 12/27/2011   Edema 12/27/2011   GERD (gastroesophageal reflux disease) 12/23/2011   GI bleed 12/23/2011   Hypertension 12/23/2011   High cholesterol 12/23/2011    Levonne Spiller PT, DPT  09/11/2020, 5:08 PM  [facility] [address] [place], [place],  [address] Phone: [phone]   Fax:  [phone]  Name: Antonio Lucas MRN: [id number] Date of Birth: Jul 14, 1955

## 2020-09-13 ENCOUNTER — Ambulatory Visit: Payer: Medicare Other

## 2020-09-13 ENCOUNTER — Other Ambulatory Visit: Payer: Self-pay

## 2020-09-13 VITALS — BP 138/89

## 2020-09-13 DIAGNOSIS — M6281 Muscle weakness (generalized): Secondary | ICD-10-CM

## 2020-09-13 DIAGNOSIS — M47816 Spondylosis without myelopathy or radiculopathy, lumbar region: Secondary | ICD-10-CM

## 2020-09-13 DIAGNOSIS — R296 Repeated falls: Secondary | ICD-10-CM

## 2020-09-13 DIAGNOSIS — R262 Difficulty in walking, not elsewhere classified: Secondary | ICD-10-CM

## 2020-09-13 NOTE — Therapy (Signed)
[facility] [address] [place], [place], [address] Phone: [phone]   Fax:  [phone]  Physical Therapy Treatment  Patient Details  Name: Antonio Lucas MRN: [id number] Date of Birth: 10-Sep-1955 Referring Provider (PT): Max Noel Gerold   Encounter Date: 09/13/2020   PT End of Session - 09/13/20 1038     Visit Number 6    Number of Visits 12    Date for PT Re-Evaluation 10/04/20    PT Start Time 0815    PT Stop Time 0910    PT Time Calculation (min) 55 min    Activity Tolerance Patient tolerated treatment well    Behavior During Therapy [facility] for tasks assessed/performed             Past Medical History:  Diagnosis Date   Arthritis    COVID-19    GERD (gastroesophageal reflux disease)    Hyperlipemia    Hypertension    Pneumonia 1995    Past Surgical History:  Procedure Laterality Date   ANTERIOR CERVICAL DECOMP/DISCECTOMY FUSION     C3-C4   BACK SURGERY     02.2011- HE HAD SOME NECK PROBLEMS WITH C7 .   CARDIAC CATHETERIZATION N/A 12/04/2014   Procedure: Left Heart Cath and Coronary Angiography;  Surgeon: Lennette Bihari, MD;  [facility];  Service: Cardiovascular;  Laterality: N/A;   CARPAL TUNNEL RELEASE     both arms   CHOLECYSTECTOMY     COLON SURGERY     COLONOSCOPY  02/16/2012   Procedure: COLONOSCOPY;  Surgeon: Malissa Hippo, MD;  [facility]: [facility];  Service: Endoscopy;  Laterality: N/A;  730   ESOPHAGOGASTRODUODENOSCOPY N/A 12/14/2013   Procedure: ESOPHAGOGASTRODUODENOSCOPY (EGD);  Surgeon: Malissa Hippo, MD;  [facility]: [facility];  Service: Endoscopy;  Laterality: N/A;  1030   ESOPHAGOGASTRODUODENOSCOPY (EGD) WITH ESOPHAGEAL DILATION  01/07/2012   Procedure: ESOPHAGOGASTRODUODENOSCOPY (EGD) WITH ESOPHAGEAL DILATION;  Surgeon: Malissa Hippo, MD;  [facility];  Service: Endoscopy;  Laterality: N/A;  145   MALONEY DILATION N/A 12/14/2013   Procedure: Elease Hashimoto  DILATION;  Surgeon: Malissa Hippo, MD;  [facility]: [facility];  Service: Endoscopy;  Laterality: N/A;   TONSILLECTOMY      Vitals:   09/13/20 0815  BP: 138/89     Subjective Assessment - 09/13/20 0815     Subjective COVID-19 screening performed upon arrival. Patient reports that he has been so tired because he is not sleeping well at night. He states that he gets pain in the R shoulder/ numbness and tingling especially when he lays on that side. And he has constant back pain - sometimes no matter which direction he may lay. He states that though the pain is not excrutiating, it keeps him up at night. He has a follow up appt with surgeon  next Wednesday 09/19/20.    Pertinent History vertiflex, lumbar stimulator, previous laminectiomy, bulging discs, ACDF    Limitations Lifting;Walking    Patient Stated Goals To be able to get around with less pain in the back and less frequency of falls.    Pain Location Back    Pain Orientation Lower    Pain Onset More than a month ago                               [facility] Adult PT Treatment/Exercise - 09/13/20 0815       Ambulation/Gait  Ambulation/Gait Yes    Ambulation Distance (Feet) 82 Feet    Assistive device Straight cane    Ambulation Surface Level    Gait Comments 3 reps 1 (fwd and retro) 2 (alt fwd/retro with quick change in direction) 3 (fwd/retro with orange [place] band for resistance)   1 LOB retro and self correct - no falls, therapist with hand on gait belt but provided minA-CGA for recovery     Posture/Postural Control   Posture/Postural Control Postural limitations    Postural Limitations Rounded Shoulders;Forward head;Flexed trunk;Weight shift left      High Level Balance   High Level Balance Activities Direction changes;Backward walking;Sudden stops    High Level Balance Comments excellent participaiton - 1 LOB and self recovery noted   additional ex :  stand on foam with fwd/retr/lat perturbations,   standing on foam with drbbling ball to promote standing perturbations,      Exercises   Exercises Other Exercises;Lumbar    Other Exercises  sit to stand with R leg retro x 10; StS "" with 3# ball toss x 8; """" with 6# ball toss, STS with hands      Lumbar Exercises: Stretches   Active Hamstring Stretch Limitations seated HS stretch 1 x 20 sec      Lumbar Exercises: Standing   Other Standing Lumbar Exercises physioball roll up the wall      Manual Therapy   Manual Therapy Joint mobilization;Soft tissue mobilization    Manual therapy comments patient reports feeling good with intervention    Joint Mobilization minimal gr I-II in modified prone to T6-T12    Soft tissue mobilization STM to latisimus dorsi, and lumbar paraspinals                      PT Short Term Goals - 08/23/20 1148       PT SHORT TERM GOAL #1   Title Indep with initial HEP    Time 2    Period Weeks    Status New    Target Date 09/06/20               PT Long Term Goals - 08/23/20 1149       PT LONG TERM GOAL #1   Title Indep with proressive HEP    Time 6    Period Weeks    Status New    Target Date 10/04/20      PT LONG TERM GOAL #2   Title Patient will be able to demonstrate indpenedent log rolling for bed mobility.    Baseline supervision - pain    Time 3    Period Weeks    Status New    Target Date 09/13/20      PT LONG TERM GOAL #3   Title Patient will be able to demonstrate improved TUG to less than 15sec to indicate improved fred mobility    Time 6    Period Weeks    Status New    Target Date 10/04/20      PT LONG TERM GOAL #4   Title Patient will demonstrates greater than 12 sit to stands in 30seconds to indicate improved endurance and ease of transfers.    Time 6    Period Weeks    Status New    Target Date 10/04/20      PT LONG TERM GOAL #5   Title Patient will demonstrates greater than 4/5 LE strength bilaterally.    Time 6  Period Weeks    Status New     Target Date 10/04/20                   Plan - 09/13/20 1038     Clinical Impression Statement Patient participated well in session today without any significant difficulty. He demonstrates excellent ankle and hip strategy to recover from perturbations while stnading on foam. Patient reports persistant low back pain but reports improvement with symptoms following manual therapy. Resisted walking completed well with one LOB staggering backwards but patient self recovered well. He presents with excellent strength as noted with staggered stance sit to stand without limiation.  Skilled PT recommended to continue per plan of care/    Personal Factors and Comorbidities Comorbidity 1;Comorbidity 2;Comorbidity 3+;Past/Current Experience;Time since onset of injury/illness/exacerbation    Examination-Activity Limitations Transfers;Squat;Bend;Locomotion Level    Examination-Participation Restrictions Occupation;Community Activity    Stability/Clinical Decision Making Unstable/Unpredictable    Clinical Decision Making Moderate    Rehab Potential Fair    PT Frequency 2x / week    PT Duration 6 weeks    PT Treatment/Interventions ADLs/Self Care Home Management;Therapeutic activities;Therapeutic exercise;Balance training;Neuromuscular re-education;Manual techniques    PT Next Visit Plan lateral walking with resistnace band - single leg step ups (in // bars) ;    Consulted and Agree with Plan of Care Patient             Patient will benefit from skilled therapeutic intervention in order to improve the following deficits and impairments:  Abnormal gait, Decreased activity tolerance, Decreased endurance, Decreased strength, Improper body mechanics, Pain, Postural dysfunction, Difficulty walking, Decreased mobility, Decreased coordination, Decreased balance, Decreased range of motion  Visit Diagnosis: Lumbar spondylosis  Difficulty in walking, not elsewhere classified  Repeated  falls  Muscle weakness (generalized)     Problem List Patient Active Problem List   Diagnosis Date Noted   Essential hypertension    Unstable angina (HCC)    Morbid obesity (HCC) 12/27/2011   Edema 12/27/2011   GERD (gastroesophageal reflux disease) 12/23/2011   GI bleed 12/23/2011   Hypertension 12/23/2011   High cholesterol 12/23/2011    Levonne Spiller PT, DPT 09/13/2020, 10:46 AM  [facility] [address] [place], [place], [address] Phone: [phone]   Fax:  [phone]  Name: Antonio Lucas MRN: [id number] Date of Birth: [birth year]

## 2020-09-18 ENCOUNTER — Other Ambulatory Visit: Payer: Self-pay

## 2020-09-18 ENCOUNTER — Ambulatory Visit: Payer: Medicare Other

## 2020-09-18 DIAGNOSIS — M47816 Spondylosis without myelopathy or radiculopathy, lumbar region: Secondary | ICD-10-CM

## 2020-09-18 DIAGNOSIS — M6281 Muscle weakness (generalized): Secondary | ICD-10-CM

## 2020-09-18 DIAGNOSIS — R296 Repeated falls: Secondary | ICD-10-CM

## 2020-09-18 DIAGNOSIS — R262 Difficulty in walking, not elsewhere classified: Secondary | ICD-10-CM

## 2020-09-18 NOTE — Therapy (Signed)
[facility] [address] [place], [place], [address] Phone: [phone]   Fax:  [phone]  Physical Therapy Treatment  Patient Details  Name: Antonio Lucas MRN: [id number] Date of Birth: 02-Apr-1955 Referring Provider (PT): Max Noel Gerold   Encounter Date: 09/18/2020   PT End of Session - 09/18/20 0905     Visit Number 7    Number of Visits 12    Date for PT Re-Evaluation 10/04/20    PT Start Time 0815    PT Stop Time 0901    PT Time Calculation (min) 46 min    Activity Tolerance Patient tolerated treatment well    Behavior During Therapy [facility] for tasks assessed/performed             Past Medical History:  Diagnosis Date   Arthritis    COVID-19    GERD (gastroesophageal reflux disease)    Hyperlipemia    Hypertension    Pneumonia 1995    Past Surgical History:  Procedure Laterality Date   ANTERIOR CERVICAL DECOMP/DISCECTOMY FUSION     C3-C4   BACK SURGERY     02.2011- HE HAD SOME NECK PROBLEMS WITH C7 .   CARDIAC CATHETERIZATION N/A 12/04/2014   Procedure: Left Heart Cath and Coronary Angiography;  Surgeon: Lennette Bihari, MD;  [facility]: [facility];  Service: Cardiovascular;  Laterality: N/A;   CARPAL TUNNEL RELEASE     both arms   CHOLECYSTECTOMY     COLON SURGERY     COLONOSCOPY  02/16/2012   Procedure: COLONOSCOPY;  Surgeon: Malissa Hippo, MD;  [facility];  Service: Endoscopy;  Laterality: N/A;  730   ESOPHAGOGASTRODUODENOSCOPY N/A 12/14/2013   Procedure: ESOPHAGOGASTRODUODENOSCOPY (EGD);  Surgeon: Malissa Hippo, MD;  [facility];  Service: Endoscopy;  Laterality: N/A;  1030   ESOPHAGOGASTRODUODENOSCOPY (EGD) WITH ESOPHAGEAL DILATION  01/07/2012   Procedure: ESOPHAGOGASTRODUODENOSCOPY (EGD) WITH ESOPHAGEAL DILATION;  Surgeon: Malissa Hippo, MD;  [facility];  Service: Endoscopy;  Laterality: N/A;  145   MALONEY DILATION N/A 12/14/2013   Procedure: Elease Hashimoto  DILATION;  Surgeon: Malissa Hippo, MD;  [facility];  Service: Endoscopy;  Laterality: N/A;   TONSILLECTOMY      There were no vitals filed for this visit.   Subjective Assessment - 09/18/20 0822     Subjective COVID-19 screening performed upon arrival. Patient reports he feels a little under it today as well. He has a n appt with his surgeon tomorrow and will ask about continuing PT. He states that he feels the therapys has helped him a good bit but he did have a rough morning today with feeling weak in his R leg. He states that he did not sleep well last night but doesnt like taking medication. His R shoulder is what bothers him most right now.    Pertinent History vertiflex, lumbar stimulator, previous laminectiomy, bulging discs, ACDF    Limitations Lifting;Walking    How long can you sit comfortably? in a recliner - for quite a while    How long can you stand comfortably? 10 minutes    How long can you walk comfortably? a few blocks    Patient Stated Goals To be able to get around with less pain in the back and less frequency of falls.    Currently in Pain? Yes    Pain Score 4     Pain Location Back    Pain Orientation Lower  [facility] Adult PT Treatment/Exercise - 09/18/20 0001       Exercises   Exercises Other Exercises;Lumbar      Lumbar Exercises: Stretches   Piriformis Stretch 5 reps;10 seconds    Piriformis Stretch Limitations Hamstring Stretch supine push in and out  Standing with hands at the wall      Lumbar Exercises: Aerobic   Nustep 10 mins ; lvl 3      Lumbar Exercises: Standing   Other Standing Lumbar Exercises resisted walking fwd/retro against blue tband      Lumbar Exercises: Seated   Sit to Stand 10 reps;Other (comment)   with resistance at the hips for eccentric control     Lumbar Exercises: Supine   Ab Set 10 reps    AB Set Limitations supine physioball press down into knees (in deadbug  position)    Bridge 15 reps    Bridge Limitations with ball squeeze at knees      Manual Therapy   Manual Therapy Soft tissue mobilization    Manual therapy comments patient reports feeling good with intervention    Soft tissue mobilization IASTM to lumbar paraspinals                      PT Short Term Goals - 08/23/20 1148       PT SHORT TERM GOAL #1   Title Indep with initial HEP    Time 2    Period Weeks    Status New    Target Date 09/06/20               PT Long Term Goals - 08/23/20 1149       PT LONG TERM GOAL #1   Title Indep with proressive HEP    Time 6    Period Weeks    Status New    Target Date 10/04/20      PT LONG TERM GOAL #2   Title Patient will be able to demonstrate indpenedent log rolling for bed mobility.    Baseline supervision - pain    Time 3    Period Weeks    Status New    Target Date 09/13/20      PT LONG TERM GOAL #3   Title Patient will be able to demonstrate improved TUG to less than 15sec to indicate improved fred mobility    Time 6    Period Weeks    Status New    Target Date 10/04/20      PT LONG TERM GOAL #4   Title Patient will demonstrates greater than 12 sit to stands in 30seconds to indicate improved endurance and ease of transfers.    Time 6    Period Weeks    Status New    Target Date 10/04/20      PT LONG TERM GOAL #5   Title Patient will demonstrates greater than 4/5 LE strength bilaterally.    Time 6    Period Weeks    Status New    Target Date 10/04/20                   Plan - 09/18/20 0905     Clinical Impression Statement Patient participated well in PT with improvement in bed mobility and transfers noted. He reports feeling really good at  the end of the session but wants to take a break this week as he feels he needs more rest. Continued emphasis on lumbopelvic support nad posterior  chain strength. Skilled PT recommended to continue per plan of care.    Personal Factors and  Comorbidities Comorbidity 1;Comorbidity 2;Comorbidity 3+;Past/Current Experience;Time since onset of injury/illness/exacerbation    Examination-Activity Limitations Transfers;Squat;Bend;Locomotion Level    Examination-Participation Restrictions Occupation;Community Activity    Stability/Clinical Decision Making Unstable/Unpredictable    Clinical Decision Making Moderate    Rehab Potential Fair    PT Frequency 2x / week    PT Duration 6 weeks    PT Treatment/Interventions ADLs/Self Care Home Management;Therapeutic activities;Therapeutic exercise;Balance training;Neuromuscular re-education;Manual techniques    PT Next Visit Plan lateral walking with resistnace band - single leg step ups (in // bars) ;             Patient will benefit from skilled therapeutic intervention in order to improve the following deficits and impairments:  Abnormal gait, Decreased activity tolerance, Decreased endurance, Decreased strength, Improper body mechanics, Pain, Postural dysfunction, Difficulty walking, Decreased mobility, Decreased coordination, Decreased balance, Decreased range of motion  Visit Diagnosis: Lumbar spondylosis  Difficulty in walking, not elsewhere classified  Repeated falls  Muscle weakness (generalized)     Problem List Patient Active Problem List   Diagnosis Date Noted   Essential hypertension    Unstable angina (HCC)    Morbid obesity (HCC) 12/27/2011   Edema 12/27/2011   GERD (gastroesophageal reflux disease) 12/23/2011   GI bleed 12/23/2011   Hypertension 12/23/2011   High cholesterol 12/23/2011    Levonne Spiller 09/18/2020, 9:10 AM  [facility] [address] [place], [place], [address] Phone: [phone]   Fax:  [phone]  Name: Antonio Lucas MRN: [id number] Date of Birth: 30-Nov-1955

## 2020-10-24 ENCOUNTER — Telehealth: Payer: Self-pay

## 2020-10-24 NOTE — Telephone Encounter (Signed)
Wellness call to follow up with patient. He anticipates having lumbar fusions in september. He will contact MD to assess if he is appropriate to start new orders for threapy prior to or after surgery.   Henrene Dodge PT, DPT

## 2021-02-14 ENCOUNTER — Other Ambulatory Visit: Payer: Self-pay | Admitting: Orthopaedic Surgery

## 2021-02-14 DIAGNOSIS — M4722 Other spondylosis with radiculopathy, cervical region: Secondary | ICD-10-CM

## 2021-02-14 DIAGNOSIS — M542 Cervicalgia: Secondary | ICD-10-CM

## 2021-03-07 ENCOUNTER — Other Ambulatory Visit: Payer: Self-pay

## 2021-03-07 ENCOUNTER — Other Ambulatory Visit: Payer: Self-pay | Admitting: Orthopaedic Surgery

## 2021-03-07 ENCOUNTER — Ambulatory Visit
Admission: RE | Admit: 2021-03-07 | Discharge: 2021-03-07 | Disposition: A | Discharge status: Home / Self Care | Payer: Medicare Other | Source: Ambulatory Visit | Attending: Orthopaedic Surgery | Admitting: Orthopaedic Surgery

## 2021-03-07 DIAGNOSIS — M4722 Other spondylosis with radiculopathy, cervical region: Secondary | ICD-10-CM

## 2021-03-07 DIAGNOSIS — M542 Cervicalgia: Secondary | ICD-10-CM

## 2021-03-14 ENCOUNTER — Ambulatory Visit
Admission: RE | Admit: 2021-03-14 | Discharge: 2021-03-14 | Disposition: A | Discharge status: Home / Self Care | Payer: Federal, State, Local not specified - PPO | Source: Ambulatory Visit | Attending: Orthopaedic Surgery | Admitting: Orthopaedic Surgery

## 2021-03-14 IMAGING — MR MR CERVICAL SPINE W/O CM
5 series · 33 of 48 positions shown · non-contrast
Comparison: Cervical spine MRI [DATE], cervical spine
radiographs [DATE]

CLINICAL DATA: Neck and right arm pain for over a year history of
fusion in [DATE]

EXAM:
MRI CERVICAL SPINE WITHOUT CONTRAST
TECHNIQUE: Multiplanar, multisequence MR imaging of the cervical spine was
performed. No intravenous contrast was administered.

[Series 2: T2 · sagittal · 3.0mm · 0.41mm/px · 8 of 17 slices shown (1 of 2)]
[im 1/17]
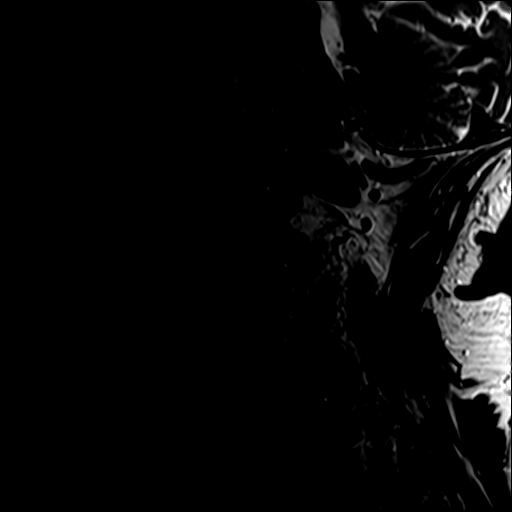
[im 3/17]
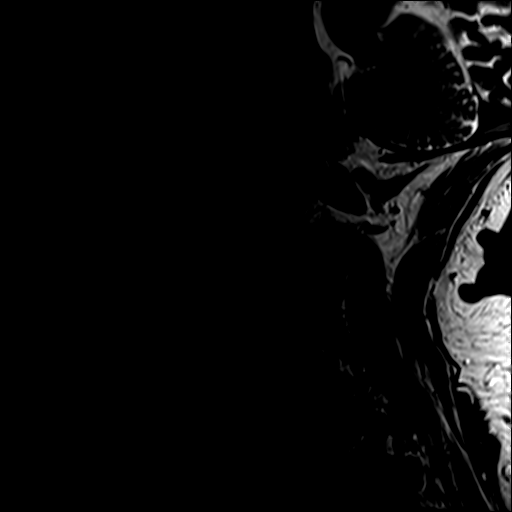
[im 5/17]
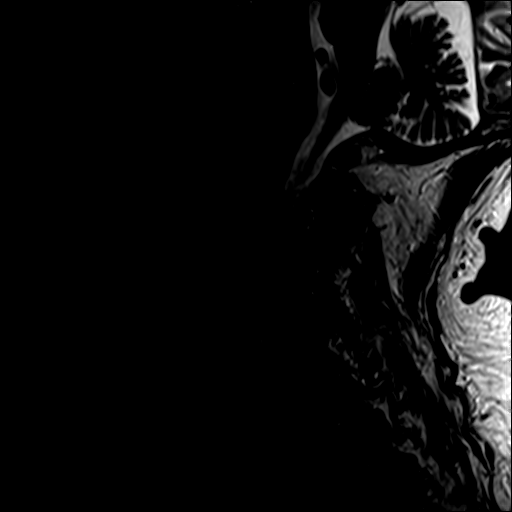
[im 7/17]
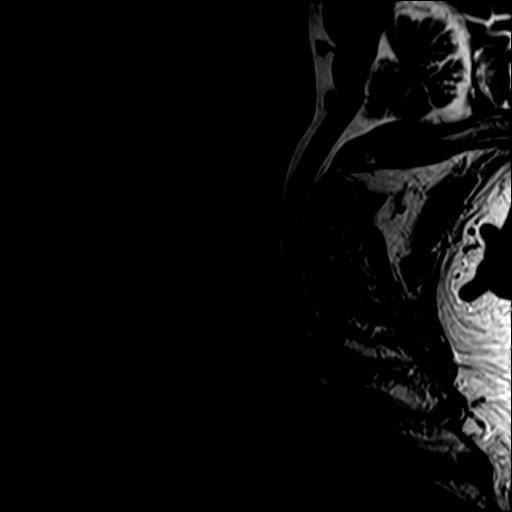
[im 10/17]
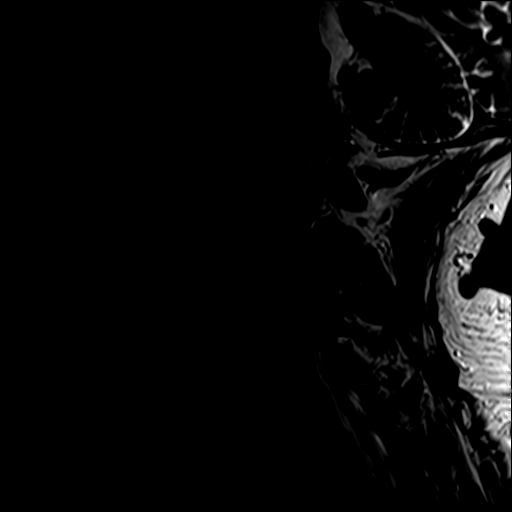
[im 12/17]
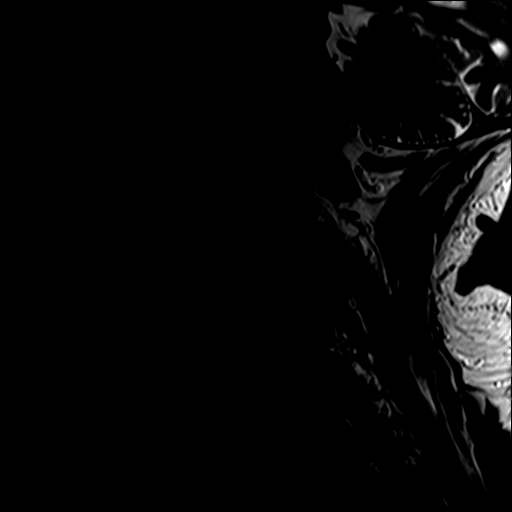
[im 14/17]
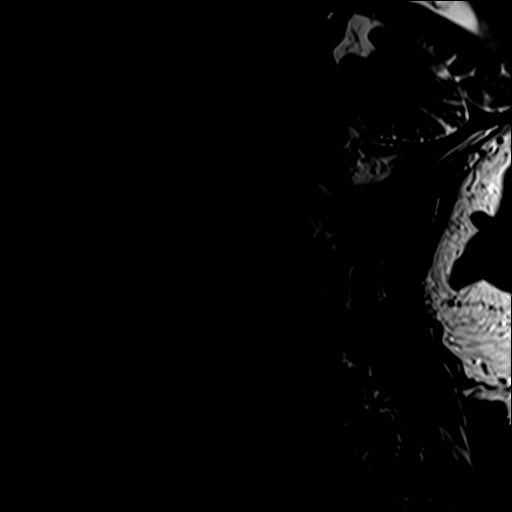
[im 17/17]
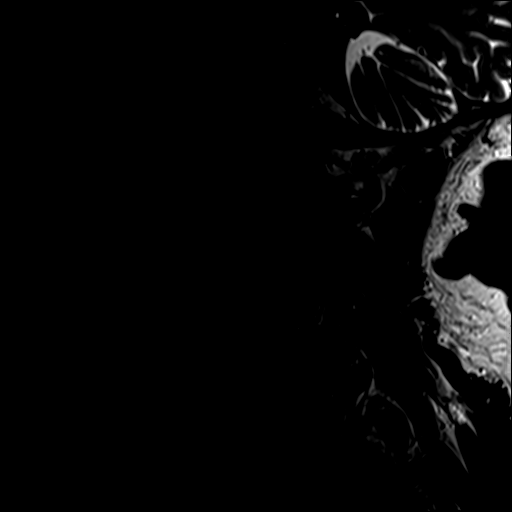

[Series 3: STIR · sagittal · 3.0mm · 0.82mm/px · 7 of 17 slices shown]
[im 1/17]
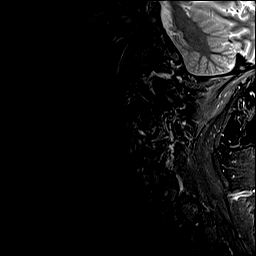
[im 3/17]
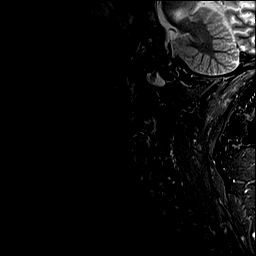
[im 6/17]
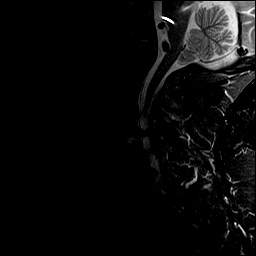
[im 9/17]
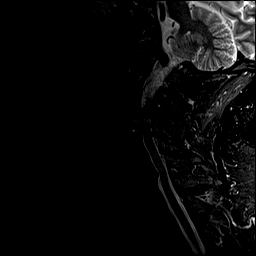
[im 11/17]
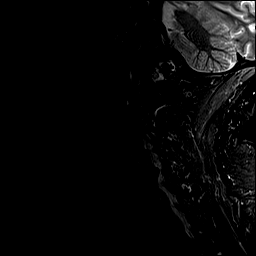
[im 14/17]
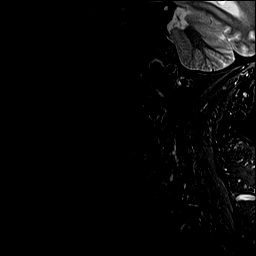
[im 17/17]
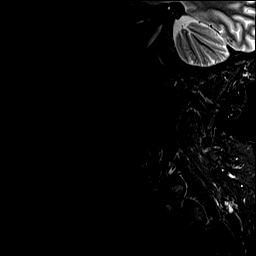

[Series 4: T1 · sagittal · 3.0mm · 0.82mm/px · 7 of 17 slices shown]
[im 1/17]
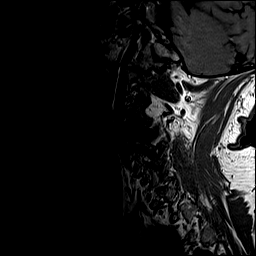
[im 3/17]
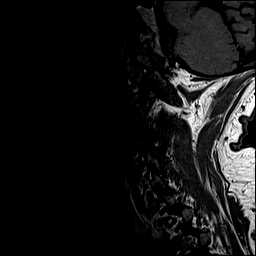
[im 6/17]
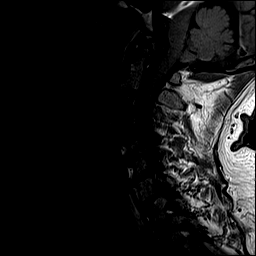
[im 9/17]
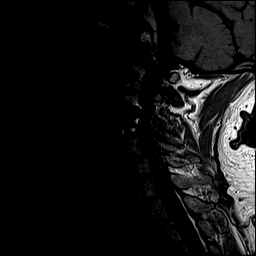
[im 11/17]
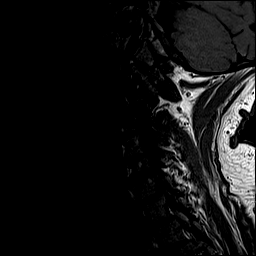
[im 14/17]
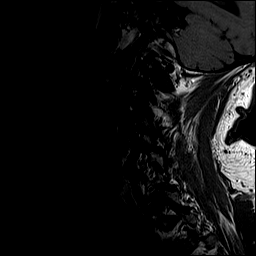
[im 17/17]
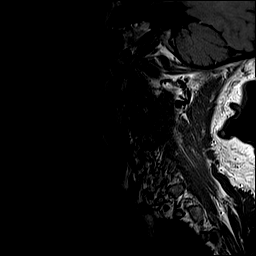

[Series 5: T2 · axial · 3.0mm · 0.70mm/px · z∈[-68,+32]mm · 9 of 29 slices shown (2 of 2)]
[im 1/29]
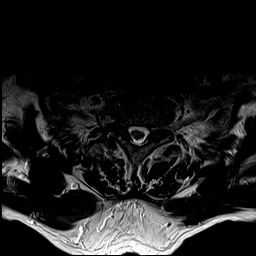
[im 5/29]
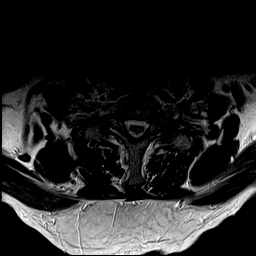
[im 10/29]
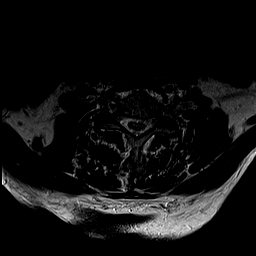
[im 12/29]
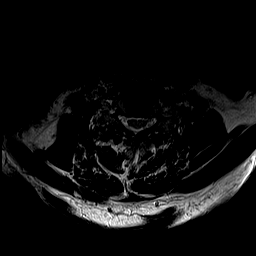
[im 15/29]
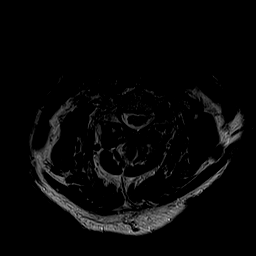
[im 17/29]
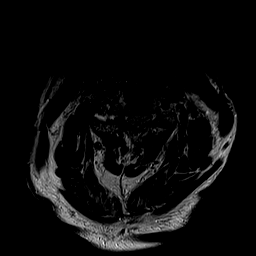
[im 19/29]
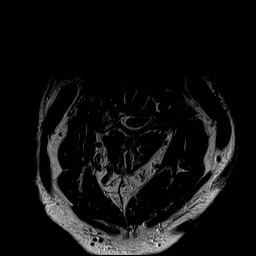
[im 24/29]
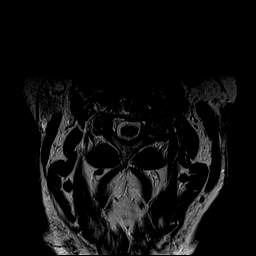
[im 29/29]
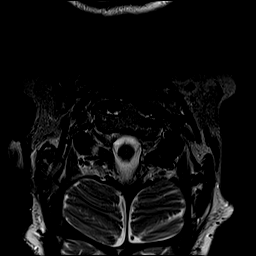

[Series 6: GRE · axial · 3.0mm · 0.35mm/px · z∈[-68,-53]mm · 2 of 29 slices shown]
[im 1/29]
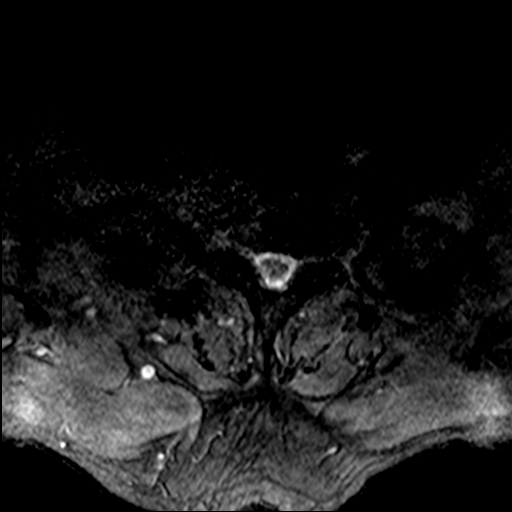
[im 5/29]
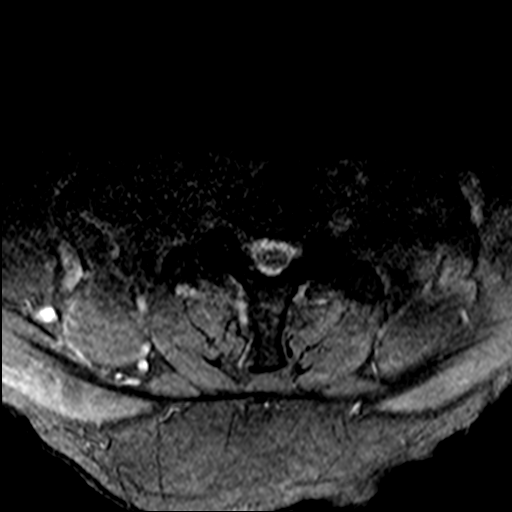

[33 of 48 positions shown; findings below may reference images not displayed]

FINDINGS: Alignment: Normal.

Vertebrae: The patient is status post C3-C4 ACDF. Alignment is
grossly within expected limits, without evidence of complication.
Vertebral body heights are preserved. There is no suspicious marrow
signal abnormality. There is trace marrow edema in the right C4 and
C5 posterior elements, likely reactive/degenerative.

Cord: There are small foci of cord signal abnormality at C3-C4, also
present in [XH] and consistent with myelomalacia from prior
compression. There is no other cord signal abnormality.

Posterior Fossa, vertebral arteries, paraspinal tissues: The imaged
posterior fossa is normal. The vertebral artery flow voids are
present. The paraspinal soft tissues are unremarkable.

Disc levels:

There is advanced disc desiccation and narrowing at C6-C7,
unchanged. The disc heights at the nonsurgical levels are otherwise
overall preserved.

There is a right facet joint effusion at C4-C5 without osseous
destruction to suggest septic arthritis. There is mild perifacetal
soft tissue edema. There is trace reactive marrow edema in the right
C4 and C5 posterior elements.

C2-C3: There is left worse than right facet arthropathy resulting in
mild left and no significant right neural foraminal stenosis and no
significant spinal canal stenosis.

C3-C4: Status post anterior fusion without residual spinal canal
stenosis or cord compression. There is uncovertebral and right worse
than left facet arthropathy resulting in moderate to severe left and
mild right neural foraminal stenosis. The right neural foraminal
stenosis has also improved compared to the preoperative study.

C4-C5: There is a posterior disc osteophyte complex, ligamentum
flavum thickening, and uncovertebral and bilateral facet arthropathy
resulting in moderate spinal canal stenosis with effacement of the
dorsal and ventral thecal sac and severe right and moderate left
neural foraminal stenosis. The spinal canal stenosis is minimally
worsened, while the neural foraminal stenosis have not significantly
changed.

C5-C6: There is a mild posterior disc osteophyte complex and
uncovertebral and right worse than left facet arthropathy resulting
in severe right and mild left neural foraminal stenosis without
significant spinal canal stenosis. The right neural foraminal
stenosis has slightly worsened since [XH].

C6-C7: There is a prominent posterior disc osteophyte complex,
bilateral uncovertebral ridging, and mild bilateral facet
arthropathy resulting in severe left and moderate right neural
foraminal stenosis and mild spinal canal stenosis with indentation
of the ventral cord. Findings are overall not significantly changed.

C7-T1: There is bilateral facet arthropathy resulting in moderate to
severe bilateral neural foraminal stenosis.
IMPRESSION: 1. Status post C3-C4 ACDF with resolved spinal canal stenosis. Cord
signal abnormality at this level is consistent with compressive
myelomalacia. Right-sided neural foraminal stenosis has also
improved this level, but there remains moderate to severe left
neural foraminal stenosis.
2. Right-sided facet arthropathy with an associated effusion and
trace reactive marrow edema at C4-C5 which could reflect a source of
pain. No osseous destruction to suggest septic arthritis.
3. Moderate spinal canal stenosis and severe right and moderate left
neural foraminal stenosis at C4-C5. The spinal canal stenosis has
minimally worsened since [XH] while the neural foraminal stenoses
have not significantly changed.
4. Severe right neural foraminal stenosis at C5-C6, slightly
worsened since [DATE]. Severe left and moderate right neural foraminal stenosis at C6-C7
and moderate to severe bilateral neural foraminal stenosis at C7-T1,
not significantly changed.

## 2021-03-17 ENCOUNTER — Other Ambulatory Visit: Payer: Medicare Other

## 2021-04-24 ENCOUNTER — Ambulatory Visit: Payer: Medicare Other | Admitting: Physical Therapy

## 2022-01-20 ENCOUNTER — Encounter (INDEPENDENT_AMBULATORY_CARE_PROVIDER_SITE_OTHER): Payer: Self-pay | Admitting: Gastroenterology

## 2022-02-12 ENCOUNTER — Other Ambulatory Visit: Payer: Self-pay | Admitting: Orthopaedic Surgery

## 2022-02-12 DIAGNOSIS — M4326 Fusion of spine, lumbar region: Secondary | ICD-10-CM

## 2022-03-04 ENCOUNTER — Ambulatory Visit
Admission: RE | Admit: 2022-03-04 | Discharge: 2022-03-04 | Disposition: A | Discharge status: Home / Self Care | Payer: Medicare Other | Source: Ambulatory Visit | Attending: Orthopaedic Surgery | Admitting: Orthopaedic Surgery

## 2022-03-04 ENCOUNTER — Ambulatory Visit
Admission: RE | Admit: 2022-03-04 | Discharge: 2022-03-04 | Disposition: A | Discharge status: Home / Self Care | Payer: Federal, State, Local not specified - PPO | Source: Ambulatory Visit | Attending: Orthopaedic Surgery | Admitting: Orthopaedic Surgery

## 2022-03-04 DIAGNOSIS — M4326 Fusion of spine, lumbar region: Secondary | ICD-10-CM

## 2022-03-04 MED ORDER — IOPAMIDOL (ISOVUE-M 200) INJECTION 41%
15.0000 mL | Freq: Once | INTRAMUSCULAR | Status: AC
  Administered 2022-03-04: 15 mL via EPIDURAL

## 2022-03-04 MED ORDER — ONDANSETRON HCL 4 MG/2ML IJ SOLN: 4.0000 mg | Freq: Once | INTRAMUSCULAR | Status: DC | PRN

## 2022-03-04 MED ORDER — MEPERIDINE HCL 50 MG/ML IJ SOLN: 50.0000 mg | Freq: Once | INTRAMUSCULAR | Status: DC | PRN

## 2022-03-04 MED ORDER — DIAZEPAM 5 MG PO TABS
5.0000 mg | ORAL_TABLET | Freq: Once | ORAL | Status: AC
  Administered 2022-03-04: 5 mg via ORAL

## 2022-03-04 NOTE — Progress Notes (Signed)
Pt has spinal cord stimulator and reports he has turned it off for her CT myelogram procedure.  

## 2022-03-04 NOTE — Discharge Instructions (Signed)

## 2023-07-10 ENCOUNTER — Emergency Department (HOSPITAL_COMMUNITY)

## 2023-07-10 ENCOUNTER — Inpatient Hospital Stay (HOSPITAL_COMMUNITY)
Admission: EM | Admit: 2023-07-10 | Discharge: 2023-07-14 | DRG: 641 | Disposition: A | Discharge status: Home Health | Attending: Family Medicine | Admitting: Family Medicine

## 2023-07-10 ENCOUNTER — Encounter (HOSPITAL_COMMUNITY): Payer: Self-pay | Admitting: *Deleted

## 2023-07-10 ENCOUNTER — Other Ambulatory Visit: Payer: Self-pay

## 2023-07-10 DIAGNOSIS — R410 Disorientation, unspecified: Secondary | ICD-10-CM | POA: Diagnosis present

## 2023-07-10 DIAGNOSIS — R5381 Other malaise: Secondary | ICD-10-CM | POA: Diagnosis present

## 2023-07-10 DIAGNOSIS — K7581 Nonalcoholic steatohepatitis (NASH): Secondary | ICD-10-CM | POA: Diagnosis present

## 2023-07-10 DIAGNOSIS — E876 Hypokalemia: Secondary | ICD-10-CM | POA: Diagnosis not present

## 2023-07-10 DIAGNOSIS — E039 Hypothyroidism, unspecified: Secondary | ICD-10-CM | POA: Diagnosis present

## 2023-07-10 DIAGNOSIS — Z6826 Body mass index (BMI) 26.0-26.9, adult: Secondary | ICD-10-CM | POA: Diagnosis not present

## 2023-07-10 DIAGNOSIS — Z885 Allergy status to narcotic agent status: Secondary | ICD-10-CM

## 2023-07-10 DIAGNOSIS — Z888 Allergy status to other drugs, medicaments and biological substances status: Secondary | ICD-10-CM

## 2023-07-10 DIAGNOSIS — E46 Unspecified protein-calorie malnutrition: Secondary | ICD-10-CM

## 2023-07-10 DIAGNOSIS — R627 Adult failure to thrive: Principal | ICD-10-CM | POA: Diagnosis present

## 2023-07-10 DIAGNOSIS — I10 Essential (primary) hypertension: Secondary | ICD-10-CM | POA: Diagnosis present

## 2023-07-10 DIAGNOSIS — S12110D Anterior displaced Type II dens fracture, subsequent encounter for fracture with routine healing: Secondary | ICD-10-CM

## 2023-07-10 DIAGNOSIS — E8809 Other disorders of plasma-protein metabolism, not elsewhere classified: Secondary | ICD-10-CM | POA: Diagnosis present

## 2023-07-10 DIAGNOSIS — Z79899 Other long term (current) drug therapy: Secondary | ICD-10-CM | POA: Diagnosis not present

## 2023-07-10 DIAGNOSIS — Z7189 Other specified counseling: Secondary | ICD-10-CM | POA: Diagnosis not present

## 2023-07-10 DIAGNOSIS — Z87891 Personal history of nicotine dependence: Secondary | ICD-10-CM

## 2023-07-10 DIAGNOSIS — E7800 Pure hypercholesterolemia, unspecified: Secondary | ICD-10-CM | POA: Diagnosis present

## 2023-07-10 DIAGNOSIS — Z8701 Personal history of pneumonia (recurrent): Secondary | ICD-10-CM | POA: Diagnosis not present

## 2023-07-10 DIAGNOSIS — R296 Repeated falls: Secondary | ICD-10-CM | POA: Diagnosis present

## 2023-07-10 DIAGNOSIS — Z8249 Family history of ischemic heart disease and other diseases of the circulatory system: Secondary | ICD-10-CM | POA: Diagnosis not present

## 2023-07-10 DIAGNOSIS — R531 Weakness: Secondary | ICD-10-CM

## 2023-07-10 DIAGNOSIS — S129XXA Fracture of neck, unspecified, initial encounter: Secondary | ICD-10-CM

## 2023-07-10 DIAGNOSIS — K7460 Unspecified cirrhosis of liver: Secondary | ICD-10-CM | POA: Diagnosis present

## 2023-07-10 DIAGNOSIS — K219 Gastro-esophageal reflux disease without esophagitis: Secondary | ICD-10-CM | POA: Diagnosis present

## 2023-07-10 DIAGNOSIS — E440 Moderate protein-calorie malnutrition: Secondary | ICD-10-CM | POA: Diagnosis present

## 2023-07-10 DIAGNOSIS — Z515 Encounter for palliative care: Secondary | ICD-10-CM | POA: Diagnosis not present

## 2023-07-10 DIAGNOSIS — S2242XA Multiple fractures of ribs, left side, initial encounter for closed fracture: Secondary | ICD-10-CM | POA: Diagnosis present

## 2023-07-10 DIAGNOSIS — Z8616 Personal history of COVID-19: Secondary | ICD-10-CM | POA: Diagnosis not present

## 2023-07-10 DIAGNOSIS — Z87442 Personal history of urinary calculi: Secondary | ICD-10-CM

## 2023-07-10 DIAGNOSIS — Z9181 History of falling: Secondary | ICD-10-CM

## 2023-07-10 DIAGNOSIS — S2249XA Multiple fractures of ribs, unspecified side, initial encounter for closed fracture: Secondary | ICD-10-CM | POA: Diagnosis present

## 2023-07-10 DIAGNOSIS — Z79890 Hormone replacement therapy (postmenopausal): Secondary | ICD-10-CM

## 2023-07-10 DIAGNOSIS — G8911 Acute pain due to trauma: Secondary | ICD-10-CM | POA: Diagnosis present

## 2023-07-10 DIAGNOSIS — W19XXXD Unspecified fall, subsequent encounter: Secondary | ICD-10-CM | POA: Diagnosis present

## 2023-07-10 LAB — CBC WITH DIFFERENTIAL/PLATELET
Abs Immature Granulocytes: 0.07 10*3/uL (ref 0.00–0.07)
Basophils Absolute: 0 10*3/uL (ref 0.0–0.1)
Basophils Relative: 0 %
Eosinophils Absolute: 0 10*3/uL (ref 0.0–0.5)
Eosinophils Relative: 0 %
HCT: 31.1 % — ABNORMAL LOW (ref 39.0–52.0)
Hemoglobin: 10.5 g/dL — ABNORMAL LOW (ref 13.0–17.0)
Immature Granulocytes: 1 %
Lymphocytes Relative: 21 %
Lymphs Abs: 1.9 10*3/uL (ref 0.7–4.0)
MCH: 34.4 pg — ABNORMAL HIGH (ref 26.0–34.0)
MCHC: 33.8 g/dL (ref 30.0–36.0)
MCV: 102 fL — ABNORMAL HIGH (ref 80.0–100.0)
Monocytes Absolute: 0.7 10*3/uL (ref 0.1–1.0)
Monocytes Relative: 8 %
Neutro Abs: 6.2 10*3/uL (ref 1.7–7.7)
Neutrophils Relative %: 70 %
Platelets: 226 10*3/uL (ref 150–400)
RBC: 3.05 MIL/uL — ABNORMAL LOW (ref 4.22–5.81)
RDW: 20.8 % — ABNORMAL HIGH (ref 11.5–15.5)
WBC: 8.8 10*3/uL (ref 4.0–10.5)
nRBC: 0 % (ref 0.0–0.2)

## 2023-07-10 LAB — URINALYSIS, ROUTINE W REFLEX MICROSCOPIC
Bilirubin Urine: NEGATIVE
Glucose, UA: NEGATIVE mg/dL
Ketones, ur: 5 mg/dL — AB
Nitrite: POSITIVE — AB
Protein, ur: 30 mg/dL — AB
RBC / HPF: >50 RBC/hpf (ref 0–5)
Specific Gravity, Urine: 1.013 (ref 1.005–1.030)
WBC, UA: >50 WBC/hpf (ref 0–5)
pH: 5 (ref 5.0–8.0)

## 2023-07-10 LAB — COMPREHENSIVE METABOLIC PANEL WITH GFR
ALT: 44 U/L (ref 0–44)
AST: 73 U/L — ABNORMAL HIGH (ref 15–41)
Albumin: 1.5 g/dL — ABNORMAL LOW (ref 3.5–5.0)
Alkaline Phosphatase: 133 U/L — ABNORMAL HIGH (ref 38–126)
Anion gap: 8 (ref 5–15)
BUN: 20 mg/dL (ref 8–23)
CO2: 25 mmol/L (ref 22–32)
Calcium: 7.7 mg/dL — ABNORMAL LOW (ref 8.9–10.3)
Chloride: 99 mmol/L (ref 98–111)
Creatinine, Ser: 1.01 mg/dL (ref 0.61–1.24)
GFR, Estimated: >60 mL/min (ref >60)
Glucose, Bld: 87 mg/dL (ref 70–99)
Potassium: 4.4 mmol/L (ref 3.5–5.1)
Sodium: 132 mmol/L — ABNORMAL LOW (ref 135–145)
Total Bilirubin: 1 mg/dL (ref 0.0–1.2)
Total Protein: 4.7 g/dL — ABNORMAL LOW (ref 6.5–8.1)

## 2023-07-10 LAB — MAGNESIUM: Magnesium: 1.9 mg/dL (ref 1.7–2.4)

## 2023-07-10 LAB — MRSA NEXT GEN BY PCR, NASAL: MRSA by PCR Next Gen: NOT DETECTED

## 2023-07-10 LAB — PHOSPHORUS: Phosphorus: 3.4 mg/dL (ref 2.5–4.6)

## 2023-07-10 LAB — HIV ANTIBODY (ROUTINE TESTING W REFLEX): HIV Screen 4th Generation wRfx: NONREACTIVE

## 2023-07-10 LAB — LIPASE, BLOOD: Lipase: 20 U/L (ref 11–51)

## 2023-07-10 LAB — AMMONIA: Ammonia: 13 umol/L (ref 9–35)

## 2023-07-10 MED ORDER — HYDROMORPHONE HCL 1 MG/ML IJ SOLN
0.5000 mg | INTRAMUSCULAR | Status: DC | PRN
  Administered 2023-07-10: 1 mg via INTRAVENOUS
  Administered 2023-07-11 – 2023-07-12 (×2): 0.5 mg via INTRAVENOUS
  Administered 2023-07-12: 1 mg via INTRAVENOUS
  Administered 2023-07-13: 0.5 mg via INTRAVENOUS
  Filled 2023-07-10: qty 0.5
  Filled 2023-07-10 (×2): qty 1
  Filled 2023-07-10 (×2): qty 0.5

## 2023-07-10 MED ORDER — FUROSEMIDE 10 MG/ML IJ SOLN
40.0000 mg | Freq: Two times a day (BID) | INTRAMUSCULAR | Status: DC
  Administered 2023-07-10 – 2023-07-11 (×3): 40 mg via INTRAVENOUS
  Filled 2023-07-10 (×3): qty 4

## 2023-07-10 MED ORDER — SENNOSIDES-DOCUSATE SODIUM 8.6-50 MG PO TABS: 1.0000 | ORAL_TABLET | Freq: Every evening | ORAL | Status: DC | PRN

## 2023-07-10 MED ORDER — TRAZODONE HCL 50 MG PO TABS
100.0000 mg | ORAL_TABLET | Freq: Every evening | ORAL | Status: DC | PRN
  Administered 2023-07-11 – 2023-07-13 (×2): 100 mg via ORAL
  Filled 2023-07-10 (×2): qty 2

## 2023-07-10 MED ORDER — ONDANSETRON HCL 4 MG PO TABS: 4.0000 mg | ORAL_TABLET | Freq: Four times a day (QID) | ORAL | Status: DC | PRN

## 2023-07-10 MED ORDER — HYDRALAZINE HCL 20 MG/ML IJ SOLN: 10.0000 mg | INTRAMUSCULAR | Status: DC | PRN

## 2023-07-10 MED ORDER — ACETAMINOPHEN 325 MG PO TABS
650.0000 mg | ORAL_TABLET | Freq: Four times a day (QID) | ORAL | Status: DC | PRN
Start: 2023-07-10 — End: 2023-07-10

## 2023-07-10 MED ORDER — MEGESTROL ACETATE 400 MG/10ML PO SUSP
400.0000 mg | Freq: Every day | ORAL | Status: DC
  Administered 2023-07-11 – 2023-07-14 (×4): 400 mg via ORAL
  Filled 2023-07-10 (×5): qty 10

## 2023-07-10 MED ORDER — ACETAMINOPHEN 500 MG PO TABS
1000.0000 mg | ORAL_TABLET | Freq: Three times a day (TID) | ORAL | Status: DC
  Administered 2023-07-10: 1000 mg via ORAL
  Filled 2023-07-10: qty 2

## 2023-07-10 MED ORDER — SODIUM CHLORIDE 0.9% FLUSH
3.0000 mL | Freq: Two times a day (BID) | INTRAVENOUS | Status: DC
  Administered 2023-07-10 – 2023-07-14 (×9): 3 mL via INTRAVENOUS

## 2023-07-10 MED ORDER — LACTULOSE 10 GM/15ML PO SOLN: 20.0000 g | Freq: Three times a day (TID) | ORAL | Status: DC

## 2023-07-10 MED ORDER — FLEET ENEMA RE ENEM: 1.0000 | ENEMA | Freq: Once | RECTAL | Status: DC | PRN

## 2023-07-10 MED ORDER — IPRATROPIUM BROMIDE 0.02 % IN SOLN: 0.5000 mg | Freq: Four times a day (QID) | RESPIRATORY_TRACT | Status: DC | PRN

## 2023-07-10 MED ORDER — ADULT MULTIVITAMIN LIQUID CH: 5.0000 mL | Freq: Every day | ORAL | Status: DC

## 2023-07-10 MED ORDER — FAMOTIDINE 20 MG PO TABS
20.0000 mg | ORAL_TABLET | Freq: Two times a day (BID) | ORAL | Status: DC
  Administered 2023-07-10 – 2023-07-14 (×8): 20 mg via ORAL
  Filled 2023-07-10 (×8): qty 1

## 2023-07-10 MED ORDER — ONDANSETRON HCL 4 MG/2ML IJ SOLN: 4.0000 mg | Freq: Four times a day (QID) | INTRAMUSCULAR | Status: DC | PRN

## 2023-07-10 MED ORDER — SODIUM CHLORIDE 0.9 % IV BOLUS
1000.0000 mL | Freq: Once | INTRAVENOUS | Status: AC
  Administered 2023-07-10: 1000 mL via INTRAVENOUS

## 2023-07-10 MED ORDER — ADULT MULTIVITAMIN W/MINERALS CH
1.0000 | ORAL_TABLET | Freq: Every day | ORAL | Status: DC
  Administered 2023-07-11 – 2023-07-14 (×4): 1 via ORAL
  Filled 2023-07-10 (×4): qty 1

## 2023-07-10 MED ORDER — FAMOTIDINE 20 MG PO TABS: 40.0000 mg | ORAL_TABLET | Freq: Every day | ORAL | Status: DC

## 2023-07-10 MED ORDER — BISACODYL 5 MG PO TBEC: 5.0000 mg | DELAYED_RELEASE_TABLET | Freq: Every day | ORAL | Status: DC | PRN

## 2023-07-10 MED ORDER — TRAZODONE HCL 50 MG PO TABS: 25.0000 mg | ORAL_TABLET | Freq: Every evening | ORAL | Status: DC | PRN

## 2023-07-10 MED ORDER — IBUPROFEN 800 MG PO TABS
800.0000 mg | ORAL_TABLET | Freq: Three times a day (TID) | ORAL | Status: DC
  Administered 2023-07-10 – 2023-07-14 (×13): 800 mg via ORAL
  Filled 2023-07-10 (×12): qty 1
  Filled 2023-07-10: qty 2

## 2023-07-10 MED ORDER — CHLORHEXIDINE GLUCONATE CLOTH 2 % EX PADS
6.0000 | MEDICATED_PAD | Freq: Every day | CUTANEOUS | Status: DC
  Administered 2023-07-11: 6 via TOPICAL

## 2023-07-10 MED ORDER — HEPARIN SODIUM (PORCINE) 5000 UNIT/ML IJ SOLN
5000.0000 [IU] | Freq: Three times a day (TID) | INTRAMUSCULAR | Status: DC
  Administered 2023-07-10 – 2023-07-14 (×11): 5000 [IU] via SUBCUTANEOUS
  Filled 2023-07-10 (×10): qty 1

## 2023-07-10 MED ORDER — OXYCODONE HCL 5 MG PO TABS
5.0000 mg | ORAL_TABLET | ORAL | Status: DC | PRN
  Administered 2023-07-10: 5 mg via ORAL
  Filled 2023-07-10: qty 1

## 2023-07-10 MED ORDER — SODIUM CHLORIDE 0.9 % IV SOLN: INTRAVENOUS | Status: AC

## 2023-07-10 MED ORDER — ALBUMIN HUMAN 5 % IV SOLN
INTRAVENOUS | Status: AC
  Filled 2023-07-10: qty 500

## 2023-07-10 MED ORDER — LEVOTHYROXINE SODIUM 75 MCG PO TABS
37.5000 ug | ORAL_TABLET | Freq: Every day | ORAL | Status: DC
  Administered 2023-07-11 – 2023-07-14 (×4): 37.5 ug via ORAL
  Filled 2023-07-10 (×3): qty 1
  Filled 2023-07-10: qty 0.5

## 2023-07-10 MED ORDER — DULOXETINE HCL 20 MG PO CPEP
40.0000 mg | ORAL_CAPSULE | Freq: Every day | ORAL | Status: DC
  Administered 2023-07-10 – 2023-07-13 (×4): 40 mg via ORAL
  Filled 2023-07-10 (×5): qty 2

## 2023-07-10 MED ORDER — TESTOSTERONE CYPIONATE 200 MG/ML IM SOLN
100.0000 mg | INTRAMUSCULAR | Status: DC
Start: 2023-07-10 — End: 2023-07-10

## 2023-07-10 MED ORDER — METHOCARBAMOL 500 MG PO TABS
500.0000 mg | ORAL_TABLET | Freq: Three times a day (TID) | ORAL | Status: DC
  Administered 2023-07-10 – 2023-07-14 (×12): 500 mg via ORAL
  Filled 2023-07-10 (×13): qty 1

## 2023-07-10 MED ORDER — SODIUM CHLORIDE 0.9% FLUSH
3.0000 mL | Freq: Two times a day (BID) | INTRAVENOUS | Status: DC
  Administered 2023-07-10 – 2023-07-13 (×5): 3 mL via INTRAVENOUS

## 2023-07-10 MED ORDER — IBUPROFEN 400 MG PO TABS: 800.0000 mg | ORAL_TABLET | Freq: Four times a day (QID) | ORAL | Status: DC | PRN

## 2023-07-10 MED ORDER — LEVALBUTEROL HCL 0.63 MG/3ML IN NEBU: 0.6300 mg | INHALATION_SOLUTION | Freq: Four times a day (QID) | RESPIRATORY_TRACT | Status: DC | PRN

## 2023-07-10 MED ORDER — SODIUM CHLORIDE 0.9 % IV SOLN: INTRAVENOUS | Status: DC

## 2023-07-10 MED ORDER — HYDROCODONE-ACETAMINOPHEN 5-325 MG PO TABS
1.0000 | ORAL_TABLET | Freq: Once | ORAL | Status: AC
  Administered 2023-07-10: 1 via ORAL
  Filled 2023-07-10: qty 1

## 2023-07-10 MED ORDER — ACETAMINOPHEN 650 MG RE SUPP: 650.0000 mg | Freq: Four times a day (QID) | RECTAL | Status: DC | PRN

## 2023-07-10 MED ORDER — POLYETHYLENE GLYCOL 3350 17 G PO PACK: 17.0000 g | PACK | Freq: Every day | ORAL | Status: DC

## 2023-07-10 MED ORDER — ALBUMIN HUMAN 5 % IV SOLN
25.0000 g | Freq: Four times a day (QID) | INTRAVENOUS | Status: AC
  Administered 2023-07-10 (×2): 25 g via INTRAVENOUS
  Filled 2023-07-10 (×2): qty 500

## 2023-07-10 MED ORDER — LACTULOSE 10 GM/15ML PO SOLN
30.0000 g | Freq: Every day | ORAL | Status: DC
  Administered 2023-07-10 – 2023-07-14 (×5): 30 g via ORAL
  Filled 2023-07-10 (×5): qty 60

## 2023-07-10 MED ORDER — ACETAMINOPHEN 325 MG PO TABS: 650.0000 mg | ORAL_TABLET | Freq: Four times a day (QID) | ORAL | Status: DC | PRN

## 2023-07-10 NOTE — H&P (Signed)
 History and Physical   Patient: Antonio Lucas                            PCP: Zimmermann, Matt, PA-C                    DOB: 10/07/1955            DOA: 07/10/2023 QMV:[id number]             DOS: 07/10/2023, 2:36 PM  Willma Hartmann, PA-C  Patient coming from:   HOME  I have personally reviewed patient's medical records, in electronic medical records, including:  [place] link, and care everywhere.    Chief Complaint:   Chief Complaint  Patient presents with   Chest Pain    History of present illness:    Antonio Lucas is a 68 year old male with history of HTN, NASH, with ascites, hypothyroidism, progressive debility, weakness status post falls, sustaining cervical neck fracture, rib fractures.. Patient apparently had a fall in April 29 where he sustained an odontoid fracture.  Was treated with ASPEN Colar.  Was discharged home, had progressive of weakness at home with difficulty ambulation, had another fall where he sustained rib fractures.  His p.o. intake has been poor.  He has been losing weight.  He is progressively getting weaker, needing assist with all ADLs now. Also having multiple episodes of confusion despite being on lactulose.  Now she is unable to take care of him alone. His last fall was about a week ago on his left-sided chest where he sustained left lower anterior rib fracture.    ED evaluation/course:  Blood pressure 109/78, pulse 87, temperature 97.6 F (36.4 C), temperature source Oral, resp. rate 12, height 5\' 7"  (1.702 m), weight 77.1 kg, SpO2 100%.  Labs WBC 8.8, hemoglobin 10.5, sodium 132, BUN 20, creatinine 1.01, calcium  7.7, glucose 87,: AST 73, total protein 4.7, ammonia level 13  Chest x-ray: Mildly displaced fractures of the left 7-9th ribs laterally. No pneumothorax is noted. Small effusion is seen  Close the patient will be admitted for failure to thrive, pain management, PT OT evaluation, possible placement  Patient Denies having: Fever,  Chills, Cough, SOB, Chest Pain, Abd pain, N/V/D, headache, dizziness, lightheadedness,  Dysuria, Joint pain, rash, open wounds     Review of Systems: As per HPI, otherwise 10 point review of systems were negative.   ----------------------------------------------------------------------------------------------------------------------  Allergies  Allergen Reactions   Imdur  [Isosorbide  Nitrate] Nausea Only    Headache also    Isosorbide  Nausea And Vomiting and Nausea Only    Pt reports nausea and terrible headache    Headache also     Pt reports nausea and terrible headache Pt reports nausea and terrible headache Headache also  Headache also  Pt reports nausea and terrible headache  Pt reports nausea and terrible headache  Pt reports nausea and terrible headache  Headache also   Headache also   Pt reports nausea and terrible headache   Metoprolol Other (See Comments)    Severe joint weakness and felt drained   Fentanyl  Nausea And Vomiting    Home MEDs:  Prior to Admission medications   Medication Sig Start Date End Date Taking? Authorizing Provider  acetaminophen  (TYLENOL ) 325 MG tablet Take 325 mg by mouth. 07/01/23 07/11/23 Yes [provider]  CLINPRO 5000 1.1 % PSTE Place onto teeth 2 (two) times daily. 02/14/20  Yes [provider]  cyclobenzaprine (  FLEXERIL) 10 MG tablet Take 10 mg by mouth. 05/08/23  Yes [provider]  DULoxetine (CYMBALTA) 20 MG capsule Take 40 mg by mouth at bedtime. 05/13/23  Yes [provider]  famotidine (PEPCID) 40 MG tablet Take 40 mg by mouth at bedtime. 10/07/22  Yes [provider]  HYDROcodone -acetaminophen  (NORCO/VICODIN) 5-325 MG tablet Take 1-2 tablets by mouth every 6 (six) hours as needed. 02/15/20  Yes [provider]  lactulose, encephalopathy, (CHRONULAC) 10 GM/15ML SOLN Take 20 g by mouth 3 (three) times daily. 06/12/23  Yes [provider]  levothyroxine (SYNTHROID) 75 MCG  tablet Take 0.5 tablets by mouth daily before breakfast. 05/08/23  Yes [provider]  Multiple Vitamin (MULTIVITAMIN) LIQD Take 5 mLs by mouth daily.   Yes [provider]  omeprazole (PRILOSEC) 40 MG capsule Take 40 mg by mouth 2 (two) times daily.   Yes [provider]  ondansetron  (ZOFRAN ) 4 MG tablet Take 4 mg by mouth every 8 (eight) hours as needed.   Yes [provider]  polyethylene glycol powder (GLYCOLAX /MIRALAX ) 17 GM/SCOOP powder Take 17 g by mouth daily. 07/01/23  Yes [provider]  spironolactone (ALDACTONE) 100 MG tablet Take 200 mg by mouth. 06/12/23  Yes [provider]  tadalafil (CIALIS) 5 MG tablet Take 5 mg by mouth daily.   Yes [provider]  testosterone cypionate (DEPOTESTOTERONE CYPIONATE) 200 MG/ML injection Inject 0.5 mLs into the muscle every 14 (fourteen) days.  11/17/11  Yes [provider]  torsemide (DEMADEX) 20 MG tablet Take 20 mg by mouth 2 (two) times daily. 06/12/23  Yes [provider]  traZODone (DESYREL) 100 MG tablet Take 100 mg by mouth at bedtime as needed for sleep. 07/08/17  Yes [provider]  zolmitriptan (ZOMIG) 5 MG tablet Take 1 tablet by mouth every 2 (two) hours as needed for migraine.  10/29/12  Yes [provider]    PRN MEDs: bisacodyl, hydrALAZINE, HYDROmorphone  (DILAUDID ) injection, ibuprofen, ipratropium, levalbuterol, ondansetron  **OR** ondansetron  (ZOFRAN ) IV, oxyCODONE , traZODone  Past Medical History:  Diagnosis Date   Arthritis    COVID-19    GERD (gastroesophageal reflux disease)    Hyperlipemia    Hypertension    Pneumonia 1995    Past Surgical History:  Procedure Laterality Date   ANTERIOR CERVICAL DECOMP/DISCECTOMY FUSION     C3-C4   BACK SURGERY     02.2011- HE HAD SOME NECK PROBLEMS WITH C7 .   CARDIAC CATHETERIZATION N/A 12/04/2014   Procedure: Left Heart Cath and Coronary Angiography;  Surgeon: Millicent Ally, MD;   Location: [facility];  Service: Cardiovascular;  Laterality: N/A;   CARPAL TUNNEL RELEASE     both arms   CHOLECYSTECTOMY     COLON SURGERY     COLONOSCOPY  02/16/2012   Procedure: COLONOSCOPY;  Surgeon: Ruby Corporal, MD;  Location: [facility];  Service: Endoscopy;  Laterality: N/A;  730   ESOPHAGOGASTRODUODENOSCOPY N/A 12/14/2013   Procedure: ESOPHAGOGASTRODUODENOSCOPY (EGD);  Surgeon: Ruby Corporal, MD;  Location: [facility];  Service: Endoscopy;  Laterality: N/A;  1030   ESOPHAGOGASTRODUODENOSCOPY (EGD) WITH ESOPHAGEAL DILATION  01/07/2012   Procedure: ESOPHAGOGASTRODUODENOSCOPY (EGD) WITH ESOPHAGEAL DILATION;  Surgeon: Ruby Corporal, MD;  Location: [facility];  Service: Endoscopy;  Laterality: N/A;  145   MALONEY DILATION N/A 12/14/2013   Procedure: Londa Rival DILATION;  Surgeon: Ruby Corporal, MD;  Location: [facility];  Service: Endoscopy;  Laterality: N/A;  TONSILLECTOMY       reports that he quit smoking about 16 years ago. His smoking use included cigarettes. He started smoking about 61 years ago. He has a 90 pack-year smoking history. He has never used smokeless tobacco. He reports current alcohol use. He reports that he does not use drugs.   Family History  Problem Relation Age of Onset   Heart attack Father     Physical Exam:   Vitals:   07/10/23 1045 07/10/23 1115 07/10/23 1200 07/10/23 1329  BP: 125/88 123/82 109/78   Pulse: 86 86 87   Resp: 12 12 12    Temp:    97.6 F (36.4 C)  TempSrc:    Oral  SpO2: 98% 94% 100%   Weight:      Height:       Constitutional: NAD, calm, comfortable Eyes: PERRL, lids and conjunctivae normal ENMT: Mucous membranes are moist. Posterior pharynx clear of any exudate or lesions.Normal dentition.  Neck: normal, supple, no masses, no thyromegaly Respiratory: clear to auscultation bilaterally, no wheezing, no crackles. Normal respiratory effort. No accessory muscle use.  Cardiovascular: Regular rate and  rhythm, no murmurs / rubs / gallops. No extremity edema. 2+ pedal pulses. No carotid bruits.  Abdomen: no tenderness, no masses palpated. No hepatosplenomegaly. Bowel sounds positive.  Musculoskeletal: Global-generalized weakness no clubbing / cyanosis. No joint deformity upper and lower extremities. Good ROM, no contractures. Normal muscle tone.  Neurologic: CN II-XII grossly intact. Sensation intact, DTR normal. Strength 5/5 in all 4.  Psychiatric: Normal judgment and insight. Alert and oriented x 3. Normal mood.  Skin: no rashes, lesions, ulcers. No induration Decubitus/ulcers:  Wounds: per nursing documentation         Labs on admission:    I have personally reviewed following labs and imaging studies  CBC: Recent Labs  Lab 07/10/23 1043  WBC 8.8  NEUTROABS 6.2  HGB 10.5*  HCT 31.1*  MCV 102.0*  PLT 226   Basic Metabolic Panel: Recent Labs  Lab 07/10/23 1043  NA 132*  K 4.4  CL 99  CO2 25  GLUCOSE 87  BUN 20  CREATININE 1.01  CALCIUM  7.7*   GFR: Estimated Creatinine Clearance: 64.5 mL/min (by C-G formula based on SCr of 1.01 mg/dL). Liver Function Tests: Recent Labs  Lab 07/10/23 1043  AST 73*  ALT 44  ALKPHOS 133*  BILITOT 1.0  PROT 4.7*  ALBUMIN 1.5*   Recent Labs  Lab 07/10/23 1043  LIPASE 20   Recent Labs  Lab 07/10/23 1043  AMMONIA 13   Urine analysis:    Component Value Date/Time   COLORURINE YELLOW 04/02/2020 1825   APPEARANCEUR CLEAR 04/02/2020 1825   LABSPEC 1.020 04/02/2020 1825   PHURINE 6.0 04/02/2020 1825   GLUCOSEU NEGATIVE 04/02/2020 1825   HGBUR NEGATIVE 04/02/2020 1825   BILIRUBINUR NEGATIVE 04/02/2020 1825   KETONESUR NEGATIVE 04/02/2020 1825   PROTEINUR 30 (A) 04/02/2020 1825   UROBILINOGEN 0.2 11/14/2012 0920   NITRITE NEGATIVE 04/02/2020 1825   LEUKOCYTESUR NEGATIVE 04/02/2020 1825    Last A1C:  No results found for: "HGBA1C"   Radiologic Exams on Admission:   DG Ribs Unilateral W/Chest Left Result  Date: 07/10/2023 CLINICAL DATA:  Recent fall with left rib pain, initial encounter EXAM: LEFT RIBS AND CHEST - 3+ VIEW COMPARISON:  None Available. FINDINGS: Cardiac shadow is within normal limits. Spinal stimulator is seen. Scattered linear scarring is noted in the lungs bilaterally. No pneumothorax is noted. Small left effusion is noted.  Multiple left-sided rib fractures are noted involving the seventh through ninth ribs. Postsurgical changes are noted in the left upper quadrant. IMPRESSION: Mildly displaced fractures of the left seventh through ninth ribs laterally. No pneumothorax is noted. Small effusion is seen. Electronically Signed   By: Violeta Grey M.D.   On: 07/10/2023 11:29    EKG:   Independently reviewed.  Orders placed or performed during the hospital encounter of 07/10/23   ED EKG   ED EKG   EKG 12-Lead   EKG 12-Lead   Repeat EKG   Repeat EKG   EKG 12-Lead   EKG 12-Lead   EKG 12-Lead   ---------------------------------------------------------------------------------------------------------------------------------------    Assessment / Plan:   Principal Problem:   FTT (failure to thrive) in adult Active Problems:   Essential hypertension   Rib fractures   Cervical spine fracture (HCC)   GERD (gastroesophageal reflux disease)   High cholesterol   Hypertension   Hypothyroidism   Debility   NASH (nonalcoholic steatohepatitis)   Assessment and Plan: * FTT (failure to thrive) in adult Moderate malnutrition - Encouraging oral intake -Initiating and scheduling Megace -Consulting dietitian, adding dietary supplements Body mass index is 26.63 kg/m.   Cervical spine fracture (HCC) - No neurological findings, continue monitoring -Continue c-collar  Rib fractures - Status post falls, with rib fractures, and pain Chest x-ray: Mildly displaced fractures of the left 7-9th ribs laterally. No pneumothorax is noted. Small effusion is seen -Continue scheduled  Tylenol , as needed Dilaudid , ibuprofen -Continue scheduled muscle relaxant -Incentive spirometer  Essential hypertension - Currently stable,  - Review home medication will resume accordingly -As needed IV hydralazine  High cholesterol - On statins due to history of liver cirrhosis and NASH Monitoring LFTs, ammonia level  GERD (gastroesophageal reflux disease) - Continue PPI  NASH (nonalcoholic steatohepatitis) History of NASH with liver cirrhosis and ascites -No abdominal distention or fluid accumulation noted at this time, ammonia level normal at 13 -Continuing home dose lactulose -Monitoring closely, avoiding hepatotoxins  Debility - Severe generalized weakness, with debility, needing assist with all ADLs now, status post falls, sustaining cervical neck fracture, rib fractures -Consulting PT OT, fall precautions -Likely will need SNF placement  Hypothyroidism - Continue home dose Synthroid      Consults called: PT / OT / TOC /dietitian -------------------------------------------------------------------------------------------------------------------------------------------- DVT prophylaxis:  heparin  injection 5,000 Units Start: 07/10/23 2200 TED hose Start: 07/10/23 1337 SCDs Start: 07/10/23 1337   Code Status:   Code Status: Full Code   Admission status: Patient will be admitted as Inpatient, with a greater than 2 midnight length of stay. Level of care: Med-Surg   Family Communication:  none at bedside  (The above findings and plan of care has been discussed with patient in detail, the patient expressed understanding and agreement of above plan)  --------------------------------------------------------------------------------------------------------------------------------------------------  Disposition Plan: >3 days Status is: Inpatient Remains inpatient appropriate because: IV analgesic for pain management, evaluation for failure to  thrive,     ----------------------------------------------------------------------------------------------------------------------------------------------------  Time spent:  70  Min.  Was spent seeing and evaluating the patient, reviewing all medical records, drawn plan of care.  SIGNED: Bobbetta Burnet, MD, FHM. FAAFP. [place] [facility], Pager  (Please use [email or web address] to page/ or secure chat through epic) If 7PM-7AM, please contact night-coverage [email or web address],  07/10/2023, 2:36 PM

## 2023-07-10 NOTE — Progress Notes (Signed)
30 Day Note  To whom it May Concern: Please be advised that the above name patient will require a short-term nursing home stay- anticipated 30 days or less rehabilitation and strengthening. The plan is for return home.

## 2023-07-10 NOTE — NC FL2 (Signed)
 [place]  MEDICAID FL2 LEVEL OF CARE FORM     IDENTIFICATION  Patient Name: Antonio Lucas Birthdate: 02-05-1956 Sex: male Admission Date (Current Location): 07/10/2023  [facility] and [place] Number:  [employer] and Address:  [facility],  [address] [place] [address]      Provider Number: [id number]  Attending Physician Name and Address:  Bobbetta Burnet, MD  Relative Name and Phone Number:  Romel Gibas [phone] (wife)    Current Level of Care: Hospital Recommended Level of Care: Skilled Nursing Facility Prior Approval Number:    Date Approved/Denied:   PASRR Number: Pending  Discharge Plan: SNF    Current Diagnoses: Patient Active Problem List   Diagnosis Date Noted   FTT (failure to thrive) in adult 07/10/2023   Rib fractures 07/10/2023   Hypothyroidism 07/10/2023   Debility 07/10/2023   Cervical spine fracture (HCC) 07/10/2023   NASH (nonalcoholic steatohepatitis) 07/10/2023   Essential hypertension    Edema 12/27/2011   GERD (gastroesophageal reflux disease) 12/23/2011   GI bleed 12/23/2011   Hypertension 12/23/2011   High cholesterol 12/23/2011    Orientation RESPIRATION BLADDER Height & Weight     Self, Time, Situation, Place  Normal Continent Weight: 77.1 kg Height:  5\' 7"  (170.2 cm)  BEHAVIORAL SYMPTOMS/MOOD NEUROLOGICAL BOWEL NUTRITION STATUS      Continent Diet (see discharge summary)  AMBULATORY STATUS COMMUNICATION OF NEEDS Skin   Limited Assist Verbally Bruising, Skin abrasions                       Personal Care Assistance Level of Assistance  Dressing, Bathing Bathing Assistance: Limited assistance   Dressing Assistance: Limited assistance     Functional Limitations Info  Sight, Hearing, Speech Sight Info: Adequate Hearing Info: Adequate Speech Info: Adequate    SPECIAL CARE FACTORS FREQUENCY  PT (By licensed PT)     PT Frequency: 5  x weekly              Contractures  Contractures Info: Not present    Additional Factors Info  Code Status, Allergies Code Status Info: Full Allergies Info: Imdur  (Isosorbide  Nitrate), Isosorbide , Metoprolol, Fentanyl            Current Medications (07/10/2023):  This is the current hospital active medication list Current Facility-Administered Medications  Medication Dose Route Frequency Provider Last Rate Last Admin   0.9 %  sodium chloride  infusion   Intravenous Continuous Shahmehdi, Seyed A, MD 100 mL/hr at 07/10/23 1546 New Bag at 07/10/23 1546   acetaminophen  (TYLENOL ) tablet 650 mg  650 mg Oral Q6H PRN Shahmehdi, Seyed A, MD       albumin human 5 % solution 25 g  25 g Intravenous Q6H Shahmehdi, Seyed A, MD       bisacodyl (DULCOLAX) EC tablet 5 mg  5 mg Oral Daily PRN Shahmehdi, Seyed A, MD       [START ON 07/11/2023] Chlorhexidine  Gluconate Cloth 2 % PADS 6 each  6 each Topical Q0600 Shahmehdi, Seyed A, MD       DULoxetine (CYMBALTA) DR capsule 40 mg  40 mg Oral QHS Shahmehdi, Seyed A, MD       famotidine (PEPCID) tablet 20 mg  20 mg Oral BID Shahmehdi, Seyed A, MD       furosemide (LASIX) injection 40 mg  40 mg Intravenous BID Shahmehdi, Seyed A, MD       heparin  injection 5,000 Units  5,000 Units Subcutaneous  Q8H Shahmehdi, Seyed A, MD       hydrALAZINE (APRESOLINE) injection 10 mg  10 mg Intravenous Q4H PRN Shahmehdi, Seyed A, MD       HYDROmorphone  (DILAUDID ) injection 0.5-1 mg  0.5-1 mg Intravenous Q2H PRN Shahmehdi, Seyed A, MD   1 mg at 07/10/23 1401   ibuprofen (ADVIL) tablet 800 mg  800 mg Oral TID Shahmehdi, Seyed A, MD       ipratropium (ATROVENT) nebulizer solution 0.5 mg  0.5 mg Nebulization Q6H PRN Shahmehdi, Seyed A, MD       lactulose (CHRONULAC) 10 GM/15ML solution 30 g  30 g Oral Daily Shahmehdi, Seyed A, MD       levalbuterol (XOPENEX) nebulizer solution 0.63 mg  0.63 mg Nebulization Q6H PRN Shahmehdi, Constantino Demark, MD       [START ON 07/11/2023] levothyroxine (SYNTHROID) tablet 37.5 mcg  37.5 mcg Oral QAC  breakfast Shahmehdi, Seyed A, MD       megestrol (MEGACE) 400 MG/10ML suspension 400 mg  400 mg Oral Daily Shahmehdi, Seyed A, MD       methocarbamol (ROBAXIN) tablet 500 mg  500 mg Oral TID Bobbetta Burnet, MD       [START ON 07/11/2023] multivitamin with minerals tablet 1 tablet  1 tablet Oral Daily Shahmehdi, Seyed A, MD       ondansetron  (ZOFRAN ) tablet 4 mg  4 mg Oral Q6H PRN Shahmehdi, Seyed A, MD       Or   ondansetron  (ZOFRAN ) injection 4 mg  4 mg Intravenous Q6H PRN Shahmehdi, Seyed A, MD       oxyCODONE  (Oxy IR/ROXICODONE ) immediate release tablet 5 mg  5 mg Oral Q4H PRN Shahmehdi, Seyed A, MD       sodium chloride  flush (NS) 0.9 % injection 3 mL  3 mL Intravenous Q12H Shahmehdi, Seyed A, MD   3 mL at 07/10/23 1356   sodium chloride  flush (NS) 0.9 % injection 3 mL  3 mL Intravenous Q12H Shahmehdi, Seyed A, MD   3 mL at 07/10/23 1356   traZODone (DESYREL) tablet 100 mg  100 mg Oral QHS PRN Shahmehdi, Seyed A, MD         Discharge Medications: Please see discharge summary for a list of discharge medications.  Relevant Imaging Results:  Relevant Lab Results:   Additional Information SSN: [id number]  Geraldina Klinefelter, RN

## 2023-07-10 NOTE — Assessment & Plan Note (Signed)
 -  Continue home dose Synthroid

## 2023-07-10 NOTE — ED Provider Notes (Signed)
 [facility] AT [facility] Provider Note   CSN: [id number] Arrival date & time: 07/10/23  6962     History  Chief Complaint  Patient presents with   Chest Pain    Antonio Lucas is a 68 y.o. male with a history including Florentina Huntsman cirrhosis with ascites, history of kidney stones, hypertension, generalized failure to thrive who was discharged from [facility] on April 29 after he fell secondary to weakness and sustained a odontoid fracture, being treated with a Aspen collar, presents here for worsening generalized weakness and difficulty with ambulation.  Wife is his primary caregiver and states she is having lots of difficulty getting him to even get out of bed let alone try to ambulate.  He denies urinary retention despite initial chief complaint, he had incontinence of urine this morning in his bed as he was unable to get out of bed to get to the bathroom.  Wife endorses increasing episodes of confusion despite being treated with lactulose.  He has had poor p.o. intake chronically but worse over the past week.  He has no focal weakness, he has generalized weakness, wife also endorses increasing edema in his extremities.  He fell a week ago landing against his left chest and has persistent localized pain at his left lower anterior rib, denies shortness of breath but has significant pain with attempting to take a deep breath.  No nausea or vomiting, no fevers, no abdominal distention.  Wife had noticed a significant weight loss which continues to worsen.  She has reached out to [facility], she would like a hospital bed at home given her the ability to take care of him better, unfortunately she has an appointment the end of June to discuss this request, in the interim she is having great difficulty caring for him.  The history is provided by the patient and the spouse.       Home Medications Prior to Admission medications   Medication Sig Start Date End Date  Taking? Authorizing Provider  acetaminophen  (TYLENOL ) 325 MG tablet Take 325 mg by mouth. 07/01/23 07/11/23 Yes [provider]  cyclobenzaprine (FLEXERIL) 10 MG tablet Take 10 mg by mouth. 05/08/23  Yes [provider]  DULoxetine (CYMBALTA) 20 MG capsule Take 40 mg by mouth at bedtime. 05/13/23  Yes [provider]  famotidine (PEPCID) 40 MG tablet Take 40 mg by mouth at bedtime. 10/07/22  Yes [provider]  lactulose, encephalopathy, (CHRONULAC) 10 GM/15ML SOLN Take 20 g by mouth 3 (three) times daily. 06/12/23  Yes [provider]  levothyroxine (SYNTHROID) 75 MCG tablet Take 0.5 tablets by mouth daily before breakfast. 05/08/23  Yes [provider]  Multiple Vitamin (MULTIVITAMIN) LIQD Take 5 mLs by mouth daily.   Yes [provider]  polyethylene glycol powder (GLYCOLAX /MIRALAX ) 17 GM/SCOOP powder Take 17 g by mouth daily. 07/01/23  Yes [provider]  spironolactone (ALDACTONE) 100 MG tablet Take 200 mg by mouth. 06/12/23  Yes [provider]  torsemide (DEMADEX) 20 MG tablet Take 20 mg by mouth 2 (two) times daily. 06/12/23  Yes [provider]  CLINPRO 5000 1.1 % PSTE Place onto teeth 2 (two) times daily. 02/14/20  Yes [provider]  HYDROcodone -acetaminophen  (NORCO/VICODIN) 5-325 MG tablet Take 1-2 tablets by mouth every 6 (six) hours as needed. 02/15/20  Yes [provider]  omeprazole (PRILOSEC) 40 MG capsule Take 40 mg by mouth 2 (two) times daily.   Yes [provider]  ondansetron  (ZOFRAN ) 4 MG tablet Take 4 mg by mouth every 8 (eight) hours as needed.   Yes [provider]  tadalafil (CIALIS) 5 MG tablet Take 5 mg by mouth daily.   Yes [provider]  testosterone cypionate (DEPOTESTOTERONE CYPIONATE) 200 MG/ML injection Inject 0.5 mLs into the muscle every 14 (fourteen) days.  11/17/11  Yes [provider]  traZODone (DESYREL) 100 MG tablet Take 100  mg by mouth at bedtime as needed for sleep. 07/08/17  Yes [provider]  zolmitriptan (ZOMIG) 5 MG tablet Take 1 tablet by mouth every 2 (two) hours as needed for migraine.  10/29/12  Yes [provider]      Allergies    Imdur  [isosorbide  nitrate], Isosorbide , Metoprolol, and Fentanyl     Review of Systems   Review of Systems  Constitutional:  Positive for activity change, appetite change and fatigue. Negative for fever.  HENT:  Negative for congestion and sore throat.   Eyes: Negative.   Respiratory:  Negative for chest tightness and shortness of breath.   Cardiovascular:  Positive for chest pain and leg swelling.       Cp per hpi,  rib cage pain  Gastrointestinal:  Negative for abdominal distention, abdominal pain, nausea and vomiting.  Genitourinary: Negative.   Musculoskeletal:  Negative for arthralgias, joint swelling and neck pain.  Skin: Negative.  Negative for rash and wound.  Neurological:  Positive for weakness. Negative for dizziness, light-headedness, numbness and headaches.  Psychiatric/Behavioral: Negative.      Physical Exam Updated Vital Signs BP 109/78   Pulse 87   Temp 97.6 F (36.4 C) (Oral)   Resp 12   Ht 5\' 7"  (1.702 m)   Wt 77.1 kg   SpO2 100%   BMI 26.63 kg/m  Physical Exam Vitals and nursing note reviewed.  Constitutional:      Appearance: He is well-developed. He is ill-appearing.     Comments: Chronically ill appearing,  cachectic, awake,  but prefers keeping eyes closed. HOH  HENT:     Head: Normocephalic and atraumatic.  Eyes:     General: No scleral icterus.    Conjunctiva/sclera: Conjunctivae normal.  Cardiovascular:     Rate and Rhythm: Normal rate and regular rhythm.     Heart sounds: Normal heart sounds.  Pulmonary:     Effort: Pulmonary effort is normal.     Breath sounds: Normal breath sounds. No wheezing.  Chest:       Comments: Tender to palpation left anterior lower rib, no palpable deformity. Abdominal:      General: Bowel sounds are normal.     Palpations: Abdomen is soft.     Tenderness: There is no abdominal tenderness.  Musculoskeletal:        General: Normal range of motion.     Cervical back: Normal range of motion.  Skin:    General: Skin is warm and dry.  Neurological:     Mental Status: He is oriented to person, place, and time. He is lethargic.     Motor: Weakness present.     Comments: Generalized weakness with grip strength and ankle strength,  no unilateral weakness.     ED Results / Procedures / Treatments   Labs (all labs ordered are listed, but only abnormal results are displayed) Labs Reviewed  CBC WITH DIFFERENTIAL/PLATELET - Abnormal; Notable for the following components:      Result Value   RBC 3.05 (*)    Hemoglobin 10.5 (*)  HCT 31.1 (*)    MCV 102.0 (*)    MCH 34.4 (*)    RDW 20.8 (*)    All other components within normal limits  COMPREHENSIVE METABOLIC PANEL WITH GFR - Abnormal; Notable for the following components:   Sodium 132 (*)    Calcium  7.7 (*)    Total Protein 4.7 (*)    Albumin 1.5 (*)    AST 73 (*)    Alkaline Phosphatase 133 (*)    All other components within normal limits  EXPECTORATED SPUTUM ASSESSMENT W GRAM STAIN, RFLX TO RESP C  LIPASE, BLOOD  AMMONIA  URINALYSIS, ROUTINE W REFLEX MICROSCOPIC  HIV ANTIBODY (ROUTINE TESTING W REFLEX)  MAGNESIUM  PHOSPHORUS    EKG EKG Interpretation Date/Time:  Friday Jul 10 2023 10:40:43 EDT Ventricular Rate:  101 PR Interval:    QRS Duration:  146 QT Interval:  428 QTC Calculation: 521 R Axis:   236  Text Interpretation: Right bundle branch block Baseline wander in lead(s) V2  Interpretation limited secondary to artifact  Confirmed by Russella Courts ([id number]) on 07/10/2023 1:09:59 PM  Radiology DG Ribs Unilateral W/Chest Left Result Date: 07/10/2023 CLINICAL DATA:  Recent fall with left rib pain, initial encounter EXAM: LEFT RIBS AND CHEST - 3+ VIEW COMPARISON:  None Available. FINDINGS:  Cardiac shadow is within normal limits. Spinal stimulator is seen. Scattered linear scarring is noted in the lungs bilaterally. No pneumothorax is noted. Small left effusion is noted. Multiple left-sided rib fractures are noted involving the seventh through ninth ribs. Postsurgical changes are noted in the left upper quadrant. IMPRESSION: Mildly displaced fractures of the left seventh through ninth ribs laterally. No pneumothorax is noted. Small effusion is seen. Electronically Signed   By: Violeta Grey M.D.   On: 07/10/2023 11:29    Procedures Procedures    Medications Ordered in ED Medications  heparin  injection 5,000 Units (has no administration in time range)  sodium chloride  flush (NS) 0.9 % injection 3 mL (has no administration in time range)  sodium chloride  flush (NS) 0.9 % injection 3 mL (has no administration in time range)  acetaminophen  (TYLENOL ) tablet 650 mg (has no administration in time range)    Or  acetaminophen  (TYLENOL ) suppository 650 mg (has no administration in time range)  oxyCODONE  (Oxy IR/ROXICODONE ) immediate release tablet 5 mg (has no administration in time range)  HYDROmorphone  (DILAUDID ) injection 0.5-1 mg (has no administration in time range)  traZODone (DESYREL) tablet 25 mg (has no administration in time range)  senna-docusate (Senokot-S) tablet 1 tablet (has no administration in time range)  bisacodyl (DULCOLAX) EC tablet 5 mg (has no administration in time range)  sodium phosphate  (FLEET) enema 1 enema (has no administration in time range)  ondansetron  (ZOFRAN ) tablet 4 mg (has no administration in time range)    Or  ondansetron  (ZOFRAN ) injection 4 mg (has no administration in time range)  ipratropium (ATROVENT) nebulizer solution 0.5 mg (has no administration in time range)  levalbuterol (XOPENEX) nebulizer solution 0.63 mg (has no administration in time range)  hydrALAZINE (APRESOLINE) injection 10 mg (has no administration in time range)  megestrol  (MEGACE) 400 MG/10ML suspension 400 mg (has no administration in time range)  0.9 %  sodium chloride  infusion (has no administration in time range)  sodium chloride  0.9 % bolus 1,000 mL (0 mLs Intravenous Stopped 07/10/23 1210)  HYDROcodone -acetaminophen  (NORCO/VICODIN) 5-325 MG per tablet 1 tablet (1 tablet Oral Given 07/10/23 1059)    ED Course/ Medical Decision Making/ A&P  Medical Decision Making Patient with a history of Florentina Huntsman associated cirrhosis, hypertension, has had increasing generalized weakness along with poor appetite, weight loss, malnutrition multiple falls currently being treated for an odontoid fracture, additional fall since being home 1 week ago with persistent left rib pain.  Wife was unable to get him out of his bed this morning.  He does endorse combination of pain but also weakness as reason for inability to ambulate currently.  He is taking hydrocodone  which does not relieve his pain symptoms.  Labs and imaging obtained as outlined below.  With multiple rib fractures will plan admission for pain control, patient would also not be opposed to rehab placement short-term, a PT evaluation has been ordered.  Amount and/or Complexity of Data Reviewed Labs: ordered.    Details: Significant labs including hemoglobin of 10.5, he denies rectal bleeding, he has a total protein of 4.7 and albumin of 1.5.  His AST is 73, alk phos 133, mild hyponatremia at 132. Radiology: ordered.    Details: Chest x-ray obtained, slightly displaced left-sided rib fractures 7 through 9. ECG/medicine tests: ordered.    Details: Artifact, rate 101, right bundle branch block which is chronic. Discussion of management or test interpretation with external provider(s): Call placed to hospitalist for admission. Spoke with Dr. Eilene Grater who accepts pt for admission.  Risk Decision regarding hospitalization.           Final Clinical Impression(s) / ED Diagnoses Final  diagnoses:  Closed fracture of multiple ribs of left side, initial encounter  Failure to thrive in adult  Weakness  Protein-calorie malnutrition, unspecified severity [facility])    Rx / DC Orders ED Discharge Orders     None         Alyse July 07/10/23 1342    Teddi Favors, DO 07/11/23 [phone]

## 2023-07-10 NOTE — Assessment & Plan Note (Signed)
-   No neurological findings, continue monitoring -Continue c-collar

## 2023-07-10 NOTE — Assessment & Plan Note (Signed)
 History of NASH with liver cirrhosis and ascites -No abdominal distention or fluid accumulation noted at this time, ammonia level normal at 13 -Continuing home dose lactulose -Monitoring closely, avoiding hepatotoxins

## 2023-07-10 NOTE — Assessment & Plan Note (Addendum)
-   Status post falls, with rib fractures, and pain Chest x-ray: Mildly displaced fractures of the left 7-9th ribs laterally. No pneumothorax is noted. Small effusion is seen -Continue scheduled Tylenol , as needed Dilaudid , ibuprofen -Continue scheduled muscle relaxant -Incentive spirometer

## 2023-07-10 NOTE — Assessment & Plan Note (Signed)
-   On statins due to history of liver cirrhosis and NASH Monitoring LFTs, ammonia level

## 2023-07-10 NOTE — Plan of Care (Signed)
  Problem: Acute Rehab PT Goals(only PT should resolve) Goal: Pt Will Go Supine/Side To Sit Outcome: Progressing Flowsheets (Taken 07/10/2023 1611) Pt will go Supine/Side to Sit:  with contact guard assist  with minimal assist Goal: Patient Will Transfer Sit To/From Stand Outcome: Progressing Flowsheets (Taken 07/10/2023 1611) Patient will transfer sit to/from stand:  with minimal assist  with contact guard assist Goal: Pt Will Transfer Bed To Chair/Chair To Bed Outcome: Progressing Flowsheets (Taken 07/10/2023 1611) Pt will Transfer Bed to Chair/Chair to Bed:  with contact guard assist  with min assist Goal: Pt Will Ambulate Outcome: Progressing Flowsheets (Taken 07/10/2023 1611) Pt will Ambulate:  50 feet  with contact guard assist  with supervision  with rolling walker  with cane   4:12 PM, 07/10/23 Walton Guppy, MPT Physical Therapist with [facility] [phone] office [phone] mobile phone

## 2023-07-10 NOTE — Assessment & Plan Note (Signed)
-   Severe generalized weakness, with debility, needing assist with all ADLs now, status post falls, sustaining cervical neck fracture, rib fractures -Consulting PT OT, fall precautions -Likely will need SNF placement

## 2023-07-10 NOTE — ED Notes (Signed)
 Pt has left rib pain from falling this morning. Pt currently being treated for neck fx. Spouse at bedside and wants pt admitted because she is unable to handle him ate home until he can get a hospital bed. [facility] Pt is prescribed pain medications 0.5 PRN. Per spouse pt took one early this morning. PA made aware.

## 2023-07-10 NOTE — Assessment & Plan Note (Signed)
 Continue PPI ?

## 2023-07-10 NOTE — ED Triage Notes (Addendum)
 Pt brought in by [facility] from home with c/o difficulty urinating x 1 year. Pt reports his wife is having difficulty at home taking are of him. EMS reports condom cath in place and pt has had failure to thrive over the last month, confined to the bed x 1 month. Fall had a fall at [facility], c-collar in place d/t C2 fx. EMS also reports pt has tenderness to left lower flank with deep breathing. CBG 91, BP 114/68, NSR on monitor for EMS.

## 2023-07-10 NOTE — Hospital Course (Signed)
 Antonio Lucas is a 68 year old male with history of HTN, NASH, with ascites, hypothyroidism, progressive debility, weakness status post falls, sustaining cervical neck fracture, rib fractures.. Patient apparently had a fall in April 29 where he sustained an odontoid fracture.  Was treated with ASPEN Colar.  Was discharged home, had progressive of weakness at home with difficulty ambulation, had another fall where he sustained rib fractures.  His p.o. intake has been poor.  He has been losing weight.  He is progressively getting weaker, needing assist with all ADLs now. Also having multiple episodes of confusion despite being on lactulose.  Now she is unable to take care of him alone. His last fall was about a week ago on his left-sided chest where he sustained left lower anterior rib fracture.    ED evaluation/course:  Blood pressure 109/78, pulse 87, temperature 97.6 F (36.4 C), temperature source Oral, resp. rate 12, height 5\' 7"  (1.702 m), weight 77.1 kg, SpO2 100%.  Labs WBC 8.8, hemoglobin 10.5, sodium 132, BUN 20, creatinine 1.01, calcium  7.7, glucose 87,: AST 73, total protein 4.7, ammonia level 13  Chest x-ray: Mildly displaced fractures of the left 7-9th ribs laterally. No pneumothorax is noted. Small effusion is seen  Close the patient will be admitted for failure to thrive, pain management, PT OT evaluation, possible placement

## 2023-07-10 NOTE — Plan of Care (Signed)

## 2023-07-10 NOTE — Assessment & Plan Note (Signed)
 Moderate malnutrition - Encouraging oral intake -Initiating and scheduling Megace -Consulting dietitian, adding dietary supplements Body mass index is 26.63 kg/m.

## 2023-07-10 NOTE — ED Notes (Signed)
 Bladder scanner read 702

## 2023-07-10 NOTE — Assessment & Plan Note (Signed)
-   Currently stable,  - Review home medication will resume accordingly -As needed IV hydralazine

## 2023-07-10 NOTE — Evaluation (Signed)
 Physical Therapy Evaluation Patient Details Name: Antonio Lucas MRN: [id number] DOB: November 13, 1955 Today's Date: 07/10/2023  History of Present Illness  Antonio Lucas is a 68 year old male with history of HTN, NASH, with ascites, hypothyroidism, progressive debility, weakness status post falls, sustaining cervical neck fracture, rib fractures..  Patient apparently had a fall in April 29 where he sustained an odontoid fracture.  Was treated with ASPEN Colar.  Was discharged home, had progressive of weakness at home with difficulty ambulation, had another fall where he sustained rib fractures.  His p.o. intake has been poor.  He has been losing weight.  He is progressively getting weaker, needing assist with all ADLs now. Also having multiple episodes of confusion despite being on lactulose.  Now she is unable to take care of him alone.  His last fall was about a week ago on his left-sided chest where he sustained left lower anterior rib fracture.   Clinical Impression  Patient demonstrates slow labored movement for sitting up at bedside, labored movement for taking steps in room without loss of balance using RW and limited mostly due to fatigue. Patient tolerated sitting up in chair after therapy. Patient will benefit from continued skilled physical therapy in hospital and recommended venue below to increase strength, balance, endurance for safe ADLs and gait.           If plan is discharge home, recommend the following: A little help with bathing/dressing/bathroom;Help with stairs or ramp for entrance;Assistance with cooking/housework;A little help with walking and/or transfers   Can travel by private vehicle   Yes    Equipment Recommendations None recommended by PT  Recommendations for Other Services       Functional Status Assessment Patient has had a recent decline in their functional status and demonstrates the ability to make significant improvements in function in a reasonable and  predictable amount of time.     Precautions / Restrictions Precautions Precautions: Fall Restrictions Weight Bearing Restrictions Per Provider Order: No      Mobility  Bed Mobility Overal bed mobility: Needs Assistance Bed Mobility: Supine to Sit     Supine to sit: Min assist     General bed mobility comments: slow labored movement    Transfers Overall transfer level: Needs assistance Equipment used: Rolling walker (2 wheels) Transfers: Sit to/from Stand, Bed to chair/wheelchair/BSC Sit to Stand: Min assist   Step pivot transfers: Min assist       General transfer comment: increased time, labored movement    Ambulation/Gait Ambulation/Gait assistance: Min assist Gait Distance (Feet): 16 Feet Assistive device: Rolling walker (2 wheels) Gait Pattern/deviations: Decreased step length - right, Decreased step length - left, Decreased stride length Gait velocity: decreased     General Gait Details: slow labored movement without loss of balance, limited mostly due to fatigue  Stairs            Wheelchair Mobility     Tilt Bed    Modified Rankin (Stroke Patients Only)       Balance Overall balance assessment: Needs assistance Sitting-balance support: Feet supported, No upper extremity supported Sitting balance-Leahy Scale: Fair Sitting balance - Comments: fair/good seated at EOB   Standing balance support: During functional activity, Bilateral upper extremity supported Standing balance-Leahy Scale: Fair Standing balance comment: using RW                             Pertinent Vitals/Pain Pain Assessment Pain Assessment: No/denies  pain    Home Living Family/patient expects to be discharged to:: Private residence Living Arrangements: Spouse/significant other Available Help at Discharge: Family;Available PRN/intermittently Type of Home: House Home Access: Stairs to enter Entrance Stairs-Rails: Right Entrance Stairs-Number of Steps:  3   Home Layout: One level Home Equipment: Agricultural consultant (2 wheels);Cane - single point;Shower seat;Grab bars - tub/shower      Prior Function Prior Level of Function : Needs assist       Physical Assist : Mobility (physical);ADLs (physical) Mobility (physical): Bed mobility;Transfers;Gait;Stairs   Mobility Comments: household and short distanced community ambulation using SPC ADLs Comments: Assisted by family     Extremity/Trunk Assessment   Upper Extremity Assessment Upper Extremity Assessment: Defer to OT evaluation    Lower Extremity Assessment Lower Extremity Assessment: Generalized weakness    Cervical / Trunk Assessment Cervical / Trunk Assessment: Kyphotic;Other exceptions Cervical / Trunk Exceptions: wearing neck brace due to odontoid fx  Communication   Communication Communication: No apparent difficulties    Cognition Arousal: Alert Behavior During Therapy: WFL for tasks assessed/performed, Anxious   PT - Cognitive impairments: No apparent impairments                         Following commands: Intact       Cueing Cueing Techniques: Verbal cues     General Comments      Exercises     Assessment/Plan    PT Assessment Patient needs continued PT services  PT Problem List Decreased strength;Decreased activity tolerance;Decreased balance;Decreased mobility       PT Treatment Interventions DME instruction;Gait training;Stair training;Functional mobility training;Therapeutic activities;Therapeutic exercise;Balance training;Patient/family education    PT Goals (Current goals can be found in the Care Plan section)  Acute Rehab PT Goals Patient Stated Goal: return home after rehab PT Goal Formulation: With patient Time For Goal Achievement: 07/24/23 Potential to Achieve Goals: Good    Frequency Min 3X/week     Co-evaluation               AM-PAC PT "6 Clicks" Mobility  Outcome Measure Help needed turning from your back to  your side while in a flat bed without using bedrails?: A Little Help needed moving from lying on your back to sitting on the side of a flat bed without using bedrails?: A Little Help needed moving to and from a bed to a chair (including a wheelchair)?: A Little Help needed standing up from a chair using your arms (e.g., wheelchair or bedside chair)?: A Little Help needed to walk in hospital room?: A Little Help needed climbing 3-5 steps with a railing? : A Lot 6 Click Score: 17    End of Session   Activity Tolerance: Patient tolerated treatment well;Patient limited by fatigue Patient left: in chair;with call bell/phone within reach Nurse Communication: Mobility status PT Visit Diagnosis: Unsteadiness on feet (R26.81);Other abnormalities of gait and mobility (R26.89);Muscle weakness (generalized) (M62.81)    Time: 1535-1601 PT Time Calculation (min) (ACUTE ONLY): 26 min   Charges:   PT Evaluation $PT Eval Moderate Complexity: 1 Mod PT Treatments $Therapeutic Activity: 23-37 mins PT General Charges $$ ACUTE PT VISIT: 1 Visit         4:10 PM, 07/10/23 Walton Guppy, MPT Physical Therapist with [facility] [phone] office [phone] mobile phone

## 2023-07-11 DIAGNOSIS — R627 Adult failure to thrive: Secondary | ICD-10-CM | POA: Diagnosis not present

## 2023-07-11 LAB — BASIC METABOLIC PANEL WITH GFR
Anion gap: 11 (ref 5–15)
BUN: 19 mg/dL (ref 8–23)
CO2: 22 mmol/L (ref 22–32)
Calcium: 7.4 mg/dL — ABNORMAL LOW (ref 8.9–10.3)
Chloride: 102 mmol/L (ref 98–111)
Creatinine, Ser: 0.95 mg/dL (ref 0.61–1.24)
GFR, Estimated: >60 mL/min (ref >60)
Glucose, Bld: 54 mg/dL — ABNORMAL LOW (ref 70–99)
Potassium: 4.3 mmol/L (ref 3.5–5.1)
Sodium: 135 mmol/L (ref 135–145)

## 2023-07-11 LAB — CBC
HCT: 28.6 % — ABNORMAL LOW (ref 39.0–52.0)
Hemoglobin: 9.9 g/dL — ABNORMAL LOW (ref 13.0–17.0)
MCH: 35.4 pg — ABNORMAL HIGH (ref 26.0–34.0)
MCHC: 34.6 g/dL (ref 30.0–36.0)
MCV: 102.1 fL — ABNORMAL HIGH (ref 80.0–100.0)
Platelets: 201 10*3/uL (ref 150–400)
RBC: 2.8 MIL/uL — ABNORMAL LOW (ref 4.22–5.81)
RDW: 21.1 % — ABNORMAL HIGH (ref 11.5–15.5)
WBC: 5.7 10*3/uL (ref 4.0–10.5)
nRBC: 0 % (ref 0.0–0.2)

## 2023-07-11 LAB — GLUCOSE, CAPILLARY
Glucose-Capillary: 55 mg/dL — ABNORMAL LOW (ref 70–99)
Glucose-Capillary: 60 mg/dL — ABNORMAL LOW (ref 70–99)

## 2023-07-11 LAB — LIPID PANEL
Cholesterol: 88 mg/dL (ref 0–200)
HDL: 30 mg/dL — ABNORMAL LOW (ref >40)
LDL Cholesterol: 46 mg/dL (ref 0–99)
Total CHOL/HDL Ratio: 2.9 ratio
Triglycerides: 60 mg/dL (ref <150)
VLDL: 12 mg/dL (ref 0–40)

## 2023-07-11 MED ORDER — ALBUMIN HUMAN 25 % IV SOLN
25.0000 g | Freq: Four times a day (QID) | INTRAVENOUS | Status: AC
  Administered 2023-07-11 (×2): 25 g via INTRAVENOUS
  Filled 2023-07-11 (×2): qty 100

## 2023-07-11 NOTE — Plan of Care (Signed)

## 2023-07-11 NOTE — Progress Notes (Addendum)
 PROGRESS NOTE    Patient: Antonio Lucas                            PCP: Zimmermann, Matt, PA-C                    DOB: 25-Apr-1955            DOA: 07/10/2023 [id number]             DOS: 07/11/2023, 9:01 AM   LOS: 1 day   Date of Service: The patient was seen and examined on 07/11/2023  Subjective:   The patient was seen and examined this morning. Hemodynamically stable. No issues overnight .  Brief Narrative:   Antonio Lucas is a 68 year old male with history of HTN, NASH, with ascites, hypothyroidism, progressive debility, weakness status post falls, sustaining cervical neck fracture, rib fractures.. Patient apparently had a fall in April 29 where he sustained an odontoid fracture.  Was treated with ASPEN Colar.  Was discharged home, had progressive of weakness at home with difficulty ambulation, had another fall where he sustained rib fractures.  His p.o. intake has been poor.  He has been losing weight.  He is progressively getting weaker, needing assist with all ADLs now. Also having multiple episodes of confusion despite being on lactulose.  Now she is unable to take care of him alone. His last fall was about a week ago on his left-sided chest where he sustained left lower anterior rib fracture.    ED evaluation/course:  Blood pressure 109/78, pulse 87, temperature 97.6 F (36.4 C), temperature source Oral, resp. rate 12, height 5\' 7"  (1.702 m), weight 77.1 kg, SpO2 100%.  Labs WBC 8.8, hemoglobin 10.5, sodium 132, BUN 20, creatinine 1.01, calcium  7.7, glucose 87,: AST 73, total protein 4.7, ammonia level 13  Chest x-ray: Mildly displaced fractures of the left 7-9th ribs laterally. No pneumothorax is noted. Small effusion is seen  Close the patient will be admitted for failure to thrive, pain management, PT OT evaluation, possible placement    Assessment & Plan:   Principal Problem:   FTT (failure to thrive) in adult Active Problems:   Essential hypertension   Rib  fractures   Cervical spine fracture (HCC)   GERD (gastroesophageal reflux disease)   High cholesterol   Hypertension   Hypothyroidism   Debility   NASH (nonalcoholic steatohepatitis)     Assessment and Plan: * FTT (failure to thrive) in adult Moderate malnutrition Body mass index is 26.63 kg/m. - Encouraging p.o. intake, -Initiating and scheduling Megace -Consulting dietitian, adding dietary supplements    Cervical spine fracture (HCC) - No neurological findings, continue monitoring -Continue c-collar  Rib fractures Stable  - Status post falls, with rib fractures, and pain Chest x-ray: Mildly displaced fractures of the left 7-9th ribs laterally. No pneumothorax is noted. Small effusion is seen -Continue scheduled Tylenol , as needed Dilaudid , ibuprofen -Continue scheduled muscle relaxant -Incentive spirometer  Essential hypertension - Currently stable,  - Not on any medications at home -As needed IV hydralazine  High cholesterol - On statins due to history of liver cirrhosis and NASH Monitoring LFTs, ammonia level  GERD (gastroesophageal reflux disease) - Continue PPI  NASH (nonalcoholic steatohepatitis) History of NASH with liver cirrhosis and ascites -No abdominal distention or fluid accumulation noted at this time, ammonia level normal at 13 -Continuing home dose lactulose -Monitoring closely, avoiding hepatotoxins  Hypoalbuminemia, acute on chronic lower  extremity edema  - Replacing albumin IV  - Initiating IV Lasix - Will monitor I's and O's, daily weight   Debility - Severe generalized weakness, with debility, needing assist with all ADLs now, status post falls, sustaining cervical neck fracture, rib fractures - Pending evaluation by PT OT, fall precautions -Likely will need SNF placement - Discussed with TOC regarding placement  Hypothyroidism - Continue home dose  Synthroid    ---------------------------------------------------------------------------------------------------------------------------------- Nutritional status:  The patient's BMI is: Body mass index is 26.63 kg/m. I agree with the assessment and plan as outlined below: ------------------------------------------------------------------------------------------------------------------------------------------------  DVT prophylaxis:  heparin  injection 5,000 Units Start: 07/10/23 2200 Place and maintain sequential compression device Start: 07/10/23 1515 TED hose Start: 07/10/23 1337 SCDs Start: 07/10/23 1337   Code Status:   Code Status: Full Code  Family Communication: Discussed with wife at bedside -Advance care planning has been discussed.   Admission status:   Status is: Inpatient Remains inpatient appropriate because: Requiring IV diuretics   Disposition: From  - home             Planning for discharge in 1-2 days: to   Procedures:   No admission procedures for hospital encounter.   Antimicrobials:  Anti-infectives (From admission, onward)    None        Medication:   Chlorhexidine  Gluconate Cloth  6 each Topical Q0600   DULoxetine  40 mg Oral QHS   famotidine  20 mg Oral BID   furosemide  40 mg Intravenous BID   heparin   5,000 Units Subcutaneous Q8H   ibuprofen  800 mg Oral TID   lactulose  30 g Oral Daily   levothyroxine  37.5 mcg Oral QAC breakfast   megestrol  400 mg Oral Daily   methocarbamol  500 mg Oral TID   multivitamin with minerals  1 tablet Oral Daily   sodium chloride  flush  3 mL Intravenous Q12H   sodium chloride  flush  3 mL Intravenous Q12H    acetaminophen , bisacodyl, hydrALAZINE, HYDROmorphone  (DILAUDID ) injection, ipratropium, levalbuterol, ondansetron  **OR** ondansetron  (ZOFRAN ) IV, oxyCODONE , traZODone   Objective:   Vitals:   07/10/23 1500 07/10/23 2000 07/10/23 2023 07/11/23 0523  BP: 123/83 112/77  110/85  Pulse: 80 76 76  79  Resp: 17 (!) 9 12 17   Temp:   (!) 97.1 F (36.2 C) 98.3 F (36.8 C)  TempSrc:   Axillary Oral  SpO2: 100% 96% 98% 95%  Weight:      Height:        Intake/Output Summary (Last 24 hours) at 07/11/2023 0901 Last data filed at 07/11/2023 0551 Gross per 24 hour  Intake 231.19 ml  Output 400 ml  Net -168.81 ml   Filed Weights   07/10/23 0924  Weight: 77.1 kg     Physical examination:        General:  AAO x 3,  cooperative, no distress;   HEENT:  Normocephalic, PERRL, otherwise with in Normal limits   Neuro:  CNII-XII intact. , normal motor and sensation, reflexes intact   Lungs:   Clear to auscultation BL, Respirations unlabored,  No wheezes / crackles  Cardio:    S1/S2, RRR, No murmure, No Rubs or Gallops   Abdomen:  Soft, non-tender, bowel sounds active all four quadrants, no guarding or peritoneal signs.  Muscular  skeletal:  Limited exam -global generalized weaknesses - in bed, able to move all 4 extremities,   2+ pulses,  symmetric, BL +2  pitting edema  Skin:  Dry,  warm to touch, negative for any Rashes,  Wounds: Please see nursing documentation         ------------------------------------------------------------------------------------------------------------------------------------------    LABs:     Latest Ref Rng & Units 07/10/2023   10:43 AM 04/02/2020    8:21 PM 06/06/2019    3:25 PM  CBC  WBC 4.0 - 10.5 K/uL 8.8  10.6  9.7   Hemoglobin 13.0 - 17.0 g/dL 36.6  44.0  34.7   Hematocrit 39.0 - 52.0 % 31.1  41.9  48.8   Platelets 150 - 400 K/uL 226  228  310       Latest Ref Rng & Units 07/11/2023    3:44 AM 07/10/2023   10:43 AM 04/02/2020    8:21 PM  CMP  Glucose 70 - 99 mg/dL 54  87  425   BUN 8 - 23 mg/dL 19  20  14    Creatinine 0.61 - 1.24 mg/dL 9.56  3.87  5.64   Sodium 135 - 145 mmol/L 135  132  134   Potassium 3.5 - 5.1 mmol/L 4.3  4.4  4.1   Chloride 98 - 111 mmol/L 102  99  99   CO2 22 - 32 mmol/L 22  25  27    Calcium  8.9 - 10.3 mg/dL  7.4  7.7  9.1   Total Protein 6.5 - 8.1 g/dL  4.7  7.2   Total Bilirubin 0.0 - 1.2 mg/dL  1.0  0.9   Alkaline Phos 38 - 126 U/L  133  72   AST 15 - 41 U/L  73  29   ALT 0 - 44 U/L  44  17        Micro Results Recent Results (from the past 240 hours)  MRSA Next Gen by PCR, Nasal     Status: None   Collection Time: 07/10/23  2:41 PM   Specimen: Nasal Mucosa; Nasal Swab  Result Value Ref Range Status   MRSA by PCR Next Gen NOT DETECTED NOT DETECTED Final    Comment: (NOTE) The GeneXpert MRSA Assay (FDA approved for NASAL specimens only), is one component of a comprehensive MRSA colonization surveillance program. It is not intended to diagnose MRSA infection nor to guide or monitor treatment for MRSA infections. Test performance is not FDA approved in patients less than 77 years old. Performed at [facility], [address], [place], [place] [address]     Radiology Reports DG Ribs Unilateral W/Chest Left Result Date: 07/10/2023 CLINICAL DATA:  Recent fall with left rib pain, initial encounter EXAM: LEFT RIBS AND CHEST - 3+ VIEW COMPARISON:  None Available. FINDINGS: Cardiac shadow is within normal limits. Spinal stimulator is seen. Scattered linear scarring is noted in the lungs bilaterally. No pneumothorax is noted. Small left effusion is noted. Multiple left-sided rib fractures are noted involving the seventh through ninth ribs. Postsurgical changes are noted in the left upper quadrant. IMPRESSION: Mildly displaced fractures of the left seventh through ninth ribs laterally. No pneumothorax is noted. Small effusion is seen. Electronically Signed   By: Violeta Grey M.D.   On: 07/10/2023 11:29    SIGNED: Bobbetta Burnet, MD, FHM. FAAFP. Arlin Benes - [facility] Time spent - 55 min.  In seeing, evaluating and examining the patient. Reviewing medical records, labs, drawn plan of care. [facility],  Pager (please use [email or web address] to page/ text) Please use Epic Secure Chat for  non-urgent communication (7AM-7PM)  If 7PM-7AM, please contact night-coverage [email or web address], 07/11/2023, 9:01 AM

## 2023-07-12 DIAGNOSIS — R627 Adult failure to thrive: Secondary | ICD-10-CM | POA: Diagnosis not present

## 2023-07-12 LAB — BASIC METABOLIC PANEL WITH GFR
Anion gap: 4 — ABNORMAL LOW (ref 5–15)
BUN: 14 mg/dL (ref 8–23)
CO2: 27 mmol/L (ref 22–32)
Calcium: 7.4 mg/dL — ABNORMAL LOW (ref 8.9–10.3)
Chloride: 102 mmol/L (ref 98–111)
Creatinine, Ser: 0.85 mg/dL (ref 0.61–1.24)
GFR, Estimated: >60 mL/min (ref >60)
Glucose, Bld: 81 mg/dL (ref 70–99)
Potassium: 3.4 mmol/L — ABNORMAL LOW (ref 3.5–5.1)
Sodium: 133 mmol/L — ABNORMAL LOW (ref 135–145)

## 2023-07-12 LAB — GLUCOSE, CAPILLARY
Glucose-Capillary: 84 mg/dL (ref 70–99)
Glucose-Capillary: 112 mg/dL — ABNORMAL HIGH (ref 70–99)

## 2023-07-12 MED ORDER — FUROSEMIDE 10 MG/ML IJ SOLN
40.0000 mg | Freq: Every day | INTRAMUSCULAR | Status: DC
  Administered 2023-07-12 – 2023-07-14 (×3): 40 mg via INTRAVENOUS
  Filled 2023-07-12 (×3): qty 4

## 2023-07-12 MED ORDER — ALBUMIN HUMAN 25 % IV SOLN
25.0000 g | Freq: Four times a day (QID) | INTRAVENOUS | Status: AC
  Administered 2023-07-12 (×2): 25 g via INTRAVENOUS
  Filled 2023-07-12 (×2): qty 100

## 2023-07-12 MED ORDER — POTASSIUM CHLORIDE CRYS ER 20 MEQ PO TBCR
40.0000 meq | EXTENDED_RELEASE_TABLET | Freq: Once | ORAL | Status: AC
Start: 2023-07-12 — End: 2023-07-12
  Administered 2023-07-12: 40 meq via ORAL
  Filled 2023-07-12: qty 2

## 2023-07-12 MED ORDER — ENSURE ENLIVE PO LIQD
237.0000 mL | Freq: Three times a day (TID) | ORAL | Status: DC
  Administered 2023-07-12 – 2023-07-13 (×3): 237 mL via ORAL

## 2023-07-12 NOTE — Plan of Care (Signed)
  Problem: Clinical Measurements: Goal: Ability to maintain clinical measurements within normal limits will improve Outcome: Progressing Goal: Will remain free from infection Outcome: Progressing Goal: Diagnostic test results will improve Outcome: Progressing Goal: Cardiovascular complication will be avoided Outcome: Progressing   Problem: Activity: Goal: Risk for activity intolerance will decrease Outcome: Progressing   Problem: Nutrition: Goal: Adequate nutrition will be maintained Outcome: Progressing   Problem: Elimination: Goal: Will not experience complications related to bowel motility Outcome: Progressing Goal: Will not experience complications related to urinary retention Outcome: Progressing   Problem: Pain Managment: Goal: General experience of comfort will improve and/or be controlled Outcome: Progressing   Problem: Safety: Goal: Ability to remain free from injury will improve Outcome: Progressing

## 2023-07-12 NOTE — Progress Notes (Signed)
 Initial Nutrition Assessment  DOCUMENTATION CODES:   Not applicable  INTERVENTION:  Multivitamin w/ minerals daily Ensure Enlive po TID, each supplement provides 350 kcal and 20 grams of protein. Magic cup TID with meals, each supplement provides 290 kcal and 9 grams of protein Encourage good PO intake   NUTRITION DIAGNOSIS:   Inadequate oral intake related to decreased appetite as evidenced by meal completion < 50%.  GOAL:   Patient will meet greater than or equal to 90% of their needs  MONITOR:   PO intake, Labs, I & O's, Weight trends, Supplement acceptance  REASON FOR ASSESSMENT:   Consult Assessment of nutrition requirement/status  ASSESSMENT:   68 y.o. male presented to the ED with worsening weakness. Pt recently discharged home after a fall and sustaining cervical neck and rib fractures. PMH includes HTN, GERD, NASH, and hypothyroidism. Pt admitted with Failure to Thrive in adult.   5/09 - Admitted   RD working remotely at time of assessment. RD attempted to reach pt via phone in room, pt unavailable. Pt admitted with concern of decrease PO intake and weight loss.  RD to order oral nutrition supplements to assist in pt PO intake.   Meal Intakes 5/10: 50% x  1 meal  Unable to assess for weight loss due to limited history within EMR.   Nutrition Related Medications: Pepcid, Lasix, Lactulose, Megace, MVI Labs: Sodium 133, Potassium 3.4, BUN 14, Creatinine 0.85  CBG: 55-112 mg/dL x 24 hrs  UOP: 1610 mL x 24 hrs  NUTRITION - FOCUSED PHYSICAL EXAM:  Deferred to follow-up.   Diet Order:   Diet Order             Diet regular Room service appropriate? Yes; Fluid consistency: Thin  Diet effective now                   EDUCATION NEEDS:   No education needs have been identified at this time  Skin:  Skin Assessment: Reviewed RN Assessment  Last BM:  Unknown/PTA  Height:  Ht Readings from Last 1 Encounters:  07/10/23 5\' 7"  (1.702 m)   Weight:   Wt Readings from Last 1 Encounters:  07/12/23 72 kg   Ideal Body Weight:  67.3 kg  BMI:  Body mass index is 24.86 kg/m.  Estimated Nutritional Needs:  Kcal:  1800-2000 Protein:  90-110 grams Fluid:  >/= 1.8 L   Doneta Furbish RD, LDN Clinical Dietitian

## 2023-07-12 NOTE — TOC CM/SW Note (Addendum)
 CM met with patient spouse and sister at bedside. Patient reported he wants to go home. Patient's spouse and sister agree with his decision to go home with palliative care. Angela Berk  [phone] the contact person call mobile number.  Family would like [facility] to provide Palliative care.  Update: CM called [facility] on call nurse Mylinda Asa notified to call patient's spouse Intake will call on Monday 07/13/2023.

## 2023-07-12 NOTE — Progress Notes (Signed)
 PROGRESS NOTE    Patient: Antonio Lucas                            PCP: Zimmermann, Matt, PA-C                    DOB: 10/21/1955            DOA: 07/10/2023 [id number]             DOS: 07/12/2023, 10:40 AM   LOS: 2 days   Date of Service: The patient was seen and examined on 07/12/2023  Subjective:   The patient was seen and examined this morning, awake alert oriented, hemodynamically stable Complaining of left-sided rib pain   Brief Narrative:   Antonio Lucas is a 68 year old male with history of HTN, NASH, with ascites, hypothyroidism, progressive debility, weakness status post falls, sustaining cervical neck fracture, rib fractures.. Patient apparently had a fall in April 29 where he sustained an odontoid fracture.  Was treated with ASPEN Colar.  Was discharged home, had progressive of weakness at home with difficulty ambulation, had another fall where he sustained rib fractures.  His p.o. intake has been poor.  He has been losing weight.  He is progressively getting weaker, needing assist with all ADLs now. Also having multiple episodes of confusion despite being on lactulose.  Now she is unable to take care of him alone. His last fall was about a week ago on his left-sided chest where he sustained left lower anterior rib fracture.    ED evaluation/course:  Blood pressure 109/78, pulse 87, temperature 97.6 F (36.4 C), temperature source Oral, resp. rate 12, height 5\' 7"  (1.702 m), weight 77.1 kg, SpO2 100%.  Labs WBC 8.8, hemoglobin 10.5, sodium 132, BUN 20, creatinine 1.01, calcium  7.7, glucose 87,: AST 73, total protein 4.7, ammonia level 13  Chest x-ray: Mildly displaced fractures of the left 7-9th ribs laterally. No pneumothorax is noted. Small effusion is seen  Close the patient will be admitted for failure to thrive, pain management, PT OT evaluation, possible placement    Assessment & Plan:   Principal Problem:   FTT (failure to thrive) in adult Active  Problems:   Essential hypertension   Rib fractures   Cervical spine fracture (HCC)   GERD (gastroesophageal reflux disease)   High cholesterol   Hypertension   Hypothyroidism   Debility   NASH (nonalcoholic steatohepatitis)     Assessment and Plan: * FTT (failure to thrive) in adult Moderate malnutrition Body mass index is 24.86 kg/m. - Will continue to encourage oral intake, -Initiating and scheduling Megace -Consulting dietitian, adding dietary supplements  Hypoalbuminemia, acute on chronic lower extremity edema  - Continue to replace albumin with albumin IV  - Continue IV Lasix 40 mg daily - Will monitor I's and O's, daily weight   Cervical spine fracture (HCC) - No neurological findings, continue monitoring -Continue c-collar  Rib fractures Complain of pain, titrating analgesics - Status post falls, with rib fractures, and pain Chest x-ray: Mildly displaced fractures of the left 7-9th ribs laterally. No pneumothorax is noted. Small effusion is seen -Continue scheduled Tylenol , as needed Dilaudid , ibuprofen -Continue scheduled muscle relaxant -Incentive spirometer  Essential hypertension - Borderline hypertensive, no home meds  High cholesterol - On statins due to history of liver cirrhosis and NASH Monitoring LFTs, ammonia level  GERD (gastroesophageal reflux disease) - Continue PPI  NASH (nonalcoholic steatohepatitis) History of NASH  with liver cirrhosis and ascites -No abdominal distention or fluid accumulation noted at this time, ammonia level normal at 13 -Continuing home dose lactulose -Monitoring closely, avoiding hepatotoxins   Hypokalemia  Monitoring and replacing orally  Debility - Severe generalized weakness, with debility, needing assist with all ADLs now, status post falls, sustaining cervical neck fracture, rib fractures - Pending evaluation by PT OT, fall precautions -Likely will need SNF placement - Discussed with TOC regarding  placement  Hypothyroidism - Continue home dose Synthroid   ---------------------------------------------------------------------------------------------------------------------------------- Nutritional status:  The patient's BMI is: Body mass index is 24.86 kg/m. I agree with the assessment and plan as outlined below: ------------------------------------------------------------------------------------------------------------------------------------------------  DVT prophylaxis:  heparin  injection 5,000 Units Start: 07/10/23 2200 Place and maintain sequential compression device Start: 07/10/23 1515 TED hose Start: 07/10/23 1337 SCDs Start: 07/10/23 1337   Code Status:   Code Status: Full Code  Family Communication: Discussed with his daughters regarding his condition and prognosis Discussed with daughter and Charlton Cooler at [phone] -Advance care planning has been discussed.   Admission status:   Status is: Inpatient Remains inpatient appropriate because: Requiring IV diuretics   Disposition: From  - home             Planning for discharge in 1-2 days: to SNF   Procedures:   No admission procedures for hospital encounter.   Antimicrobials:  Anti-infectives (From admission, onward)    None        Medication:   DULoxetine  40 mg Oral QHS   famotidine  20 mg Oral BID   furosemide  40 mg Intravenous Daily   heparin   5,000 Units Subcutaneous Q8H   ibuprofen  800 mg Oral TID   lactulose  30 g Oral Daily   levothyroxine  37.5 mcg Oral QAC breakfast   megestrol  400 mg Oral Daily   methocarbamol  500 mg Oral TID   multivitamin with minerals  1 tablet Oral Daily   sodium chloride  flush  3 mL Intravenous Q12H   sodium chloride  flush  3 mL Intravenous Q12H    acetaminophen , bisacodyl, hydrALAZINE, HYDROmorphone  (DILAUDID ) injection, ipratropium, levalbuterol, ondansetron  **OR** ondansetron  (ZOFRAN ) IV, oxyCODONE , traZODone   Objective:   Vitals:   07/11/23  1417 07/11/23 2035 07/12/23 0500 07/12/23 0508  BP: 109/81 106/73  104/75  Pulse: 80 89  79  Resp: 10 16    Temp: 98.9 F (37.2 C) 98.6 F (37 C)  98.4 F (36.9 C)  TempSrc: Oral Oral  Oral  SpO2: 100% 97%  95%  Weight:   72 kg   Height:        Intake/Output Summary (Last 24 hours) at 07/12/2023 1040 Last data filed at 07/12/2023 0100 Gross per 24 hour  Intake 495.98 ml  Output 1900 ml  Net -1404.02 ml   Filed Weights   07/10/23 0924 07/12/23 0500  Weight: 77.1 kg 72 kg     Physical examination:   General:  AAO x 3,  cooperative, no distress;   HEENT:  C-collar in place normocephalic, PERRL, otherwise with in Normal limits   Neuro:  CNII-XII intact. , normal motor and sensation, reflexes intact   Lungs:   Clear to auscultation BL, Respirations unlabored,  No wheezes / crackles  Cardio:    S1/S2, RRR, No murmure, No Rubs or Gallops   Abdomen:  Soft, non-tender, bowel sounds active all four quadrants, no guarding or peritoneal signs.  Muscular  skeletal:  Left-sided rib cage tenderness Limited exam -global generalized  weaknesses - in bed, able to move all 4 extremities,   2+ pulses,  symmetric, +2 pitting edema in upper and lower extremities  Skin:  Dry, warm to touch, negative for any Rashes,  Wounds: Please see nursing documentation         ------------------------------------------------------------------------------------------------------------------------------------------    LABs:     Latest Ref Rng & Units 07/11/2023    9:35 AM 07/10/2023   10:43 AM 04/02/2020    8:21 PM  CBC  WBC 4.0 - 10.5 K/uL 5.7  8.8  10.6   Hemoglobin 13.0 - 17.0 g/dL 9.9  40.9  81.1   Hematocrit 39.0 - 52.0 % 28.6  31.1  41.9   Platelets 150 - 400 K/uL 201  226  228       Latest Ref Rng & Units 07/12/2023    4:13 AM 07/11/2023    3:44 AM 07/10/2023   10:43 AM  CMP  Glucose 70 - 99 mg/dL 81  54  87   BUN 8 - 23 mg/dL 14  19  20    Creatinine 0.61 - 1.24 mg/dL 9.14  7.82  9.56    Sodium 135 - 145 mmol/L 133  135  132   Potassium 3.5 - 5.1 mmol/L 3.4  4.3  4.4   Chloride 98 - 111 mmol/L 102  102  99   CO2 22 - 32 mmol/L 27  22  25    Calcium  8.9 - 10.3 mg/dL 7.4  7.4  7.7   Total Protein 6.5 - 8.1 g/dL   4.7   Total Bilirubin 0.0 - 1.2 mg/dL   1.0   Alkaline Phos 38 - 126 U/L   133   AST 15 - 41 U/L   73   ALT 0 - 44 U/L   44        Micro Results Recent Results (from the past 240 hours)  MRSA Next Gen by PCR, Nasal     Status: None   Collection Time: 07/10/23  2:41 PM   Specimen: Nasal Mucosa; Nasal Swab  Result Value Ref Range Status   MRSA by PCR Next Gen NOT DETECTED NOT DETECTED Final    Comment: (NOTE) The GeneXpert MRSA Assay (FDA approved for NASAL specimens only), is one component of a comprehensive MRSA colonization surveillance program. It is not intended to diagnose MRSA infection nor to guide or monitor treatment for MRSA infections. Test performance is not FDA approved in patients less than 72 years old. Performed at [facility], [address] [place], [place] [address]     Radiology Reports No results found.   SIGNED: Bobbetta Burnet, MD, FHM. FAAFP. Arlin Benes - [facility] Time spent - 55 min.  In seeing, evaluating and examining the patient. Reviewing medical records, labs, drawn plan of care. [facility],  Pager (please use [email or web address] to page/ text) Please use Epic Secure Chat for non-urgent communication (7AM-7PM)  If 7PM-7AM, please contact night-coverage [email or web address], 07/12/2023, 10:40 AM

## 2023-07-12 NOTE — Plan of Care (Signed)
  Problem: Clinical Measurements: Goal: Ability to maintain clinical measurements within normal limits will improve Outcome: Progressing Goal: Will remain free from infection Outcome: Progressing Goal: Diagnostic test results will improve Outcome: Progressing   Problem: Activity: Goal: Risk for activity intolerance will decrease Outcome: Progressing   Problem: Elimination: Goal: Will not experience complications related to urinary retention Outcome: Progressing   Problem: Pain Managment: Goal: General experience of comfort will improve and/or be controlled Outcome: Progressing   Problem: Safety: Goal: Ability to remain free from injury will improve Outcome: Progressing

## 2023-07-13 ENCOUNTER — Encounter (HOSPITAL_COMMUNITY): Payer: Self-pay | Admitting: Family Medicine

## 2023-07-13 DIAGNOSIS — R627 Adult failure to thrive: Secondary | ICD-10-CM | POA: Diagnosis not present

## 2023-07-13 DIAGNOSIS — Z7189 Other specified counseling: Secondary | ICD-10-CM

## 2023-07-13 DIAGNOSIS — Z515 Encounter for palliative care: Secondary | ICD-10-CM

## 2023-07-13 LAB — BASIC METABOLIC PANEL WITH GFR
Anion gap: 3 — ABNORMAL LOW (ref 5–15)
BUN: 13 mg/dL (ref 8–23)
CO2: 26 mmol/L (ref 22–32)
Calcium: 7.7 mg/dL — ABNORMAL LOW (ref 8.9–10.3)
Chloride: 105 mmol/L (ref 98–111)
Creatinine, Ser: 0.84 mg/dL (ref 0.61–1.24)
GFR, Estimated: >60 mL/min (ref >60)
Glucose, Bld: 102 mg/dL — ABNORMAL HIGH (ref 70–99)
Potassium: 3.8 mmol/L (ref 3.5–5.1)
Sodium: 134 mmol/L — ABNORMAL LOW (ref 135–145)

## 2023-07-13 LAB — GLUCOSE, CAPILLARY: Glucose-Capillary: 104 mg/dL — ABNORMAL HIGH (ref 70–99)

## 2023-07-13 MED ORDER — MEGESTROL ACETATE 400 MG/10ML PO SUSP: 400.0000 mg | Freq: Every day | ORAL | 0 refills | Status: AC

## 2023-07-13 MED ORDER — IBUPROFEN 800 MG PO TABS: 800.0000 mg | ORAL_TABLET | Freq: Three times a day (TID) | ORAL | 0 refills | Status: DC

## 2023-07-13 MED ORDER — ENSURE ENLIVE PO LIQD: 237.0000 mL | Freq: Three times a day (TID) | ORAL | 12 refills | Status: AC

## 2023-07-13 MED ORDER — METHOCARBAMOL 500 MG PO TABS: 500.0000 mg | ORAL_TABLET | Freq: Three times a day (TID) | ORAL | 1 refills | Status: DC

## 2023-07-13 MED ORDER — ADULT MULTIVITAMIN W/MINERALS CH: 1.0000 | ORAL_TABLET | Freq: Every day | ORAL | 1 refills | Status: DC

## 2023-07-13 NOTE — Consult Note (Signed)
 Consultation Note Date: 07/13/2023   Patient Name: Antonio Lucas  DOB: [birth year]  MRN: [id number]  Age / Sex: 68 y.o., male  PCP: Willma Hartmann, PA-C Referring Physician: Bobbetta Burnet, MD  Reason for Consultation: Establishing goals of care  HPI/Patient Profile: 68 y.o. male  with past medical history of HTN/HLD, NASH with ascites, hypothyroid, progressive debility and weakness status post fall sustaining cervical neck fracture and rib fractures, cardiac cath 2016, cholecystectomy, admitted on 07/10/2023 with failure to thrive in adult, low albumin, NASH.   Clinical Assessment and Goals of Care: I have reviewed medical records including EPIC notes, labs and imaging, received report from RN, assessed the patient.  Mr. Wirtanen is sitting up in bed.  He appears chronically ill and somewhat frail.  He greets me, making and mostly keeping eye contact.  He is alert and oriented, able to make his needs known.  There is no family at bedside at this time.    We meet at the bedside to discuss diagnosis prognosis, GOC, EOL wishes, disposition and options.  I introduced Palliative Medicine as specialized medical care for people living with serious illness. It focuses on providing relief from the symptoms and stress of a serious illness. The goal is to improve quality of life for both the patient and the family.  We focused on their current illness.  Mr. Tanori tells me that until a few weeks ago he was in normal health independent with ADLs.  He talks about plans for improved mobility and assistance with ADLs.  We talked about the plan for home with outpatient palliative services for further goals of care discussion.  The natural disease trajectory and expectations at EOL were discussed.  Advanced directives, concepts specific to code status, artifical feeding and hydration, and rehospitalization were considered and  discussed.  We talked about the concept of "treat the treatable, but allow a natural passing".  Mr. Priscilla Brothers states that he would want attempted resuscitation.  He speaks of his faith when asked how long he he would accept life support.  He states that he has had these discussions with his wife who knows his wishes.  Hospice and Palliative Care services outpatient were discussed.  Mr. Leprade is to discharge home with the benefits of [facility] palliative services.  We talked about the benefits of hospice care for increased services, when the time is right.  Discussed the importance of continued conversation with family and the medical providers regarding overall plan of care and treatment options, ensuring decisions are within the context of the patient's values and GOCs.  Questions and concerns were addressed.   The family was encouraged to call with questions or concerns.  PMT will continue to support holistically.  Conference with attending, bedside nursing staff, transition of care team related to patient condition, needs, goals of care, disposition.   HCPOA  NEXT OF KIN -Mr. Urquidi tells me that his wife, Shelvy Dickens, is his healthcare surrogate    SUMMARY OF RECOMMENDATIONS   Continue to treat the treatable  Full scope/full code, no limits set on life support Home with [facility] outpatient palliative services    Code Status/Advance Care Planning: Full code -we talked about the concept of "treat the treatable, but allow a natural passing".  Mr. Krell states that he would want attempted resuscitation.  At this point he shares that he will not set limits on life support.  He shares that he has had these discussions with his wife.  Symptom Management:  Per hospitalist, no additional needs at this time.  Palliative Prophylaxis:  Frequent Pain Assessment and Oral Care  Additional Recommendations (Limitations, Scope, Preferences): Full Scope Treatment  Psycho-social/Spiritual:  Desire for  further Chaplaincy support:no Additional Recommendations: Caregiving  Support/Resources and Education on Hospice  Prognosis:  Unable to determine, based on outcomes.  Guarded at this point.  Discharge Planning: Home with Palliative Services      Primary Diagnoses: Present on Admission:  FTT (failure to thrive) in adult  Rib fractures  Hypertension  High cholesterol  GERD (gastroesophageal reflux disease)  Essential hypertension  Hypothyroidism  Debility  NASH (nonalcoholic steatohepatitis)   I have reviewed the medical record, interviewed the patient and family, and examined the patient. The following aspects are pertinent.  Past Medical History:  Diagnosis Date   Arthritis    COVID-19    GERD (gastroesophageal reflux disease)    Hyperlipemia    Hypertension    Pneumonia 1995   Social History   Socioeconomic History   Marital status: Married    Spouse name: Not on file   Number of children: Not on file   Years of education: Not on file   Highest education level: Not on file  Occupational History   Not on file  Tobacco Use   Smoking status: Former    Current packs/day: 0.00    Average packs/day: 2.0 packs/day for 45.0 years (90.0 ttl pk-yrs)    Types: Cigarettes    Start date: 03/03/1962    Quit date: 03/04/2007    Years since quitting: 16.3   Smokeless tobacco: Never  Vaping Use   Vaping status: Never Used  Substance and Sexual Activity   Alcohol use: Yes    Comment: couple glasses of wine a week   Drug use: No   Sexual activity: Not on file  Other Topics Concern   Not on file  Social History Narrative   He has 3 children  With one son with Epsteins anomaly. He ihas has surgical surgical repair . Rudy stopped smoking 3 years ago one time , he smoked 2-3 packs per day. He will drink one glass of wine per day. 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    Social Drivers of Corporate investment banker Strain: Low Risk   (03/27/2023)   Received from [facility]   Overall Financial Resource Strain (CARDIA)    Difficulty of Paying Living Expenses: Not very hard  Food Insecurity: No Food Insecurity (07/10/2023)   Hunger Vital Sign    Worried About Running Out of Food in the Last Year: Never true    Ran Out of Food in the Last Year: Never true  Transportation Needs: No Transportation Needs (07/10/2023)   PRAPARE - Administrator, Civil Service (Medical): No    Lack of Transportation (Non-Medical): No  Physical Activity: Unknown (03/27/2023)   Received from [facility]   Exercise Vital Sign    Days of Exercise per Week: 0 days  Minutes of Exercise per Session: Not on file  Stress: Patient Declined (03/27/2023)   Received from [facility] of Occupational Health - Occupational Stress Questionnaire    Feeling of Stress : Patient declined  Social Connections: Socially Integrated (07/10/2023)   Social Connection and Isolation Panel [NHANES]    Frequency of Communication with Friends and Family: More than three times a week    Frequency of Social Gatherings with Friends and Family: More than three times a week    Attends Religious Services: 1 to 4 times per year    Active Member of [employer] or Organizations: Yes    Attends Banker Meetings: 1 to 4 times per year    Marital Status: Married   Family History  Problem Relation Age of Onset   Heart attack Father    Scheduled Meds:  DULoxetine  40 mg Oral QHS   famotidine  20 mg Oral BID   feeding supplement  237 mL Oral TID BM   furosemide  40 mg Intravenous Daily   heparin   5,000 Units Subcutaneous Q8H   ibuprofen  800 mg Oral TID   lactulose  30 g Oral Daily   levothyroxine  37.5 mcg Oral QAC breakfast   megestrol  400 mg Oral Daily   methocarbamol  500 mg Oral TID   multivitamin with minerals  1 tablet Oral Daily   sodium chloride  flush  3 mL Intravenous Q12H   sodium chloride  flush  3 mL Intravenous Q12H    Continuous Infusions: PRN Meds:.acetaminophen , bisacodyl, hydrALAZINE, HYDROmorphone  (DILAUDID ) injection, ipratropium, levalbuterol, ondansetron  **OR** ondansetron  (ZOFRAN ) IV, oxyCODONE , traZODone Medications Prior to Admission:  Prior to Admission medications   Medication Sig Start Date End Date Taking? Authorizing Provider  CLINPRO 5000 1.1 % PSTE Place onto teeth 2 (two) times daily. 02/14/20  Yes [provider]  cyclobenzaprine (FLEXERIL) 10 MG tablet Take 10 mg by mouth. 05/08/23  Yes [provider]  DULoxetine (CYMBALTA) 20 MG capsule Take 40 mg by mouth at bedtime. 05/13/23  Yes [provider]  famotidine (PEPCID) 40 MG tablet Take 40 mg by mouth at bedtime. 10/07/22  Yes [provider]  HYDROcodone -acetaminophen  (NORCO/VICODIN) 5-325 MG tablet Take 1-2 tablets by mouth every 6 (six) hours as needed. 02/15/20  Yes [provider]  lactulose, encephalopathy, (CHRONULAC) 10 GM/15ML SOLN Take 20 g by mouth 3 (three) times daily. 06/12/23  Yes [provider]  levothyroxine (SYNTHROID) 75 MCG tablet Take 0.5 tablets by mouth daily before breakfast. 05/08/23  Yes [provider]  Multiple Vitamin (MULTIVITAMIN) LIQD Take 5 mLs by mouth daily.   Yes [provider]  omeprazole (PRILOSEC) 40 MG capsule Take 40 mg by mouth 2 (two) times daily.   Yes [provider]  ondansetron  (ZOFRAN ) 4 MG tablet Take 4 mg by mouth every 8 (eight) hours as needed.   Yes [provider]  polyethylene glycol powder (GLYCOLAX /MIRALAX ) 17 GM/SCOOP powder Take 17 g by mouth daily. 07/01/23  Yes [provider]  spironolactone (ALDACTONE) 100 MG tablet Take 200 mg by mouth. 06/12/23  Yes [provider]  tadalafil (CIALIS) 5 MG tablet Take 5 mg by mouth daily.   Yes [provider]  testosterone cypionate (DEPOTESTOTERONE CYPIONATE) 200 MG/ML injection Inject 0.5 mLs into the muscle every 14 (fourteen)  days.  11/17/11  Yes [provider]  torsemide (DEMADEX) 20 MG tablet Take 20 mg by mouth 2 (two) times daily. 06/12/23  Yes  [provider]  traZODone (DESYREL) 100 MG tablet Take 100 mg by mouth at bedtime as needed for sleep. 07/08/17  Yes [provider]  zolmitriptan (ZOMIG) 5 MG tablet Take 1 tablet by mouth every 2 (two) hours as needed for migraine.  10/29/12  Yes [provider]   Allergies  Allergen Reactions   Imdur  [Isosorbide  Nitrate] Nausea Only    Headache also    Isosorbide  Nausea And Vomiting and Nausea Only    Pt reports nausea and terrible headache    Headache also     Pt reports nausea and terrible headache Pt reports nausea and terrible headache Headache also  Headache also  Pt reports nausea and terrible headache  Pt reports nausea and terrible headache  Pt reports nausea and terrible headache  Headache also   Headache also   Pt reports nausea and terrible headache   Metoprolol Other (See Comments)    Severe joint weakness and felt drained   Fentanyl  Nausea And Vomiting   Review of Systems  Unable to perform ROS: Other    Physical Exam Vitals and nursing note reviewed.  Constitutional:      General: He is not in acute distress.    Appearance: He is ill-appearing.  Cardiovascular:     Rate and Rhythm: Normal rate.  Pulmonary:     Effort: Pulmonary effort is normal. No tachypnea.  Skin:    General: Skin is warm and dry.  Neurological:     Mental Status: He is alert and oriented to person, place, and time.     Vital Signs: BP (!) 126/90 (BP Location: Left Arm)   Pulse 87   Temp 98.3 F (36.8 C) (Oral)   Resp 14   Ht 5\' 7"  (1.702 m)   Wt 77.6 kg   SpO2 99%   BMI 26.79 kg/m  Pain Scale: 0-10 POSS *See Group Information*: S-Acceptable,Sleep, easy to arouse Pain Score: 8    SpO2: SpO2: 99 % O2 Device:SpO2: 99 % O2 Flow Rate: .   IO: Intake/output summary:  Intake/Output Summary (Last 24 hours) at 07/13/2023  1311 Last data filed at 07/13/2023 1300 Gross per 24 hour  Intake 435.43 ml  Output 1200 ml  Net -764.57 ml    LBM: Last BM Date : 07/09/23 Baseline Weight: Weight: 77.1 kg Most recent weight: Weight: 77.6 kg     Palliative Assessment/Data:     Time In: 1100 Time Out: 1155 Time Total: 55 minutes  Greater than 50%  of this time was spent counseling and coordinating care related to the above assessment and plan.  Signed by: Annabelle Barrack, NP   Please contact Palliative Medicine Team phone at [phone] for questions and concerns.  For individual provider: See Tilford Foley

## 2023-07-13 NOTE — Progress Notes (Signed)
 Patient requires frequent re-positioning of the body in ways that cannot be achieved with an ordinary bed or wedge pillow, to eliminate pain, reduce pressure, and the head of the bed to be elevated more than 30 degrees most of the time due to cervical spine fracture and rib fractures.

## 2023-07-13 NOTE — Plan of Care (Signed)
  Problem: Clinical Measurements: Goal: Ability to maintain clinical measurements within normal limits will improve Outcome: Progressing Goal: Will remain free from infection Outcome: Progressing Goal: Diagnostic test results will improve Outcome: Progressing   Problem: Elimination: Goal: Will not experience complications related to urinary retention Outcome: Progressing   Problem: Pain Managment: Goal: General experience of comfort will improve and/or be controlled Outcome: Progressing   Problem: Safety: Goal: Ability to remain free from injury will improve Outcome: Progressing

## 2023-07-13 NOTE — Progress Notes (Signed)
 PROGRESS NOTE    Patient: Antonio Lucas                            PCP: Zimmermann, Matt, PA-C                    DOB: 1955-12-17            DOA: 07/10/2023 HYQ:[id number]             DOS: 07/13/2023, 12:09 PM   LOS: 3 days   Date of Service: The patient was seen and examined on 07/13/2023  Subjective:   The patient was seen and examined this morning, awake alert oriented, complaining of generalized aches and pain especially in his rib cage area difficulty with deep breathing. Complain of severe generalized weaknesses, now completely dependent with all ADLs  Aware that SNF days are limited, willing to return home.  Open to discussion with palliative care   Brief Narrative:   Antonio Lucas is a 68 year old male with history of HTN, NASH, with ascites, hypothyroidism, progressive debility, weakness status post falls, sustaining cervical neck fracture, rib fractures.. Patient apparently had a fall in April 29 where he sustained an odontoid fracture.  Was treated with ASPEN Colar.  Was discharged home, had progressive of weakness at home with difficulty ambulation, had another fall where he sustained rib fractures.  His p.o. intake has been poor.  He has been losing weight.  He is progressively getting weaker, needing assist with all ADLs now. Also having multiple episodes of confusion despite being on lactulose.  Now she is unable to take care of him alone. His last fall was about a week ago on his left-sided chest where he sustained left lower anterior rib fracture.    ED evaluation/course:  Blood pressure 109/78, pulse 87, temperature 97.6 F (36.4 C), temperature source Oral, resp. rate 12, height 5\' 7"  (1.702 m), weight 77.1 kg, SpO2 100%.  Labs WBC 8.8, hemoglobin 10.5, sodium 132, BUN 20, creatinine 1.01, calcium  7.7, glucose 87,: AST 73, total protein 4.7, ammonia level 13  Chest x-ray: Mildly displaced fractures of the left 7-9th ribs laterally. No pneumothorax is noted. Small  effusion is seen  Close the patient will be admitted for failure to thrive, pain management, PT OT evaluation, possible placement    Assessment & Plan:   Principal Problem:   FTT (failure to thrive) in adult Active Problems:   Essential hypertension   Rib fractures   Cervical spine fracture (HCC)   GERD (gastroesophageal reflux disease)   High cholesterol   Hypertension   Hypothyroidism   Debility   NASH (nonalcoholic steatohepatitis)     Assessment and Plan: * FTT (failure to thrive) in adult Moderate malnutrition Body mass index is 26.79 kg/m. - Encouraged to increase oral intake - Initiated Megace -Consulting dietitian, adding dietary supplements  Hypoalbuminemia, acute on chronic lower extremity edema  - Third spacing due to protein deficiency - S/p replacement of albumin with IV Lasix 40 mg daily - Will monitor I's and O's, daily weight   Cervical spine fracture (HCC) - No neurological findings, continue monitoring -Continue c-collar  Rib fractures Complain of pain, titrating analgesics -Anticipating adding long-acting analgesics - Status post falls, with rib fractures, and pain Chest x-ray: Mildly displaced fractures of the left 7-9th ribs laterally. No pneumothorax is noted. Small effusion is seen -Continue scheduled Tylenol , as needed Dilaudid , ibuprofen -Continue scheduled muscle relaxant -Incentive spirometer  Essential hypertension - Borderline hypertensive, no home meds  High cholesterol - On statins due to history of liver cirrhosis and NASH -Therefore avoiding hepatotoxins Monitoring LFTs, ammonia level  GERD - Continue PPI  NASH (nonalcoholic steatohepatitis) History of NASH with liver cirrhosis and ascites -No abdominal distention or fluid accumulation noted at this time, ammonia level normal at 13 -Continuing home dose lactulose -Monitoring closely, avoiding hepatotoxins   Hypokalemia  - Monitoring and replacing  orally  Debility - Severe generalized weakness, with debility, needing assist with all ADLs now, status post falls, sustaining cervical neck fracture, rib fractures - Pending evaluation by PT OT, fall precautions - The patient and family has declined SNF due to limited days, willing to return home with home health-palliative care follow-up - Discussed with TOC regarding placement  Hypothyroidism - Continue home dose Synthroid  Ethics: Discussed with daughters and his wife-along with patient wishes Family is eager to speak with palliative care-   Due to above comorbidities, progressive decline prognosis remain poor We are recommending DNR/DNI and palliative care follow-up -ultimately hospice t  ---------------------------------------------------------------------------------------------------------------------------------- Nutritional status:  The patient's BMI is: Body mass index is 26.79 kg/m. I agree with the assessment and plan as outlined below: ------------------------------------------------------------------------------------------------------------------------------------------------  DVT prophylaxis:  heparin  injection 5,000 Units Start: 07/10/23 2200 Place and maintain sequential compression device Start: 07/10/23 1515 TED hose Start: 07/10/23 1337 SCDs Start: 07/10/23 1337   Code Status:   Code Status: Full Code  Family Communication: Discussed with his daughters regarding his condition and prognosis Discussed with daughter and Charlton Cooler at [phone]  Wife updated  -Advance care planning has been discussed.   Admission status:   Status is: Inpatient Remains inpatient appropriate because: Requiring IV diuretics   Disposition: From  - home  Plan of discharge: Home with home health, palliative care   Procedures:   No admission procedures for hospital encounter.   Antimicrobials:  Anti-infectives (From admission, onward)    None         Medication:   DULoxetine  40 mg Oral QHS   famotidine  20 mg Oral BID   feeding supplement  237 mL Oral TID BM   furosemide  40 mg Intravenous Daily   heparin   5,000 Units Subcutaneous Q8H   ibuprofen  800 mg Oral TID   lactulose  30 g Oral Daily   levothyroxine  37.5 mcg Oral QAC breakfast   megestrol  400 mg Oral Daily   methocarbamol  500 mg Oral TID   multivitamin with minerals  1 tablet Oral Daily   sodium chloride  flush  3 mL Intravenous Q12H   sodium chloride  flush  3 mL Intravenous Q12H    acetaminophen , bisacodyl, hydrALAZINE, HYDROmorphone  (DILAUDID ) injection, ipratropium, levalbuterol, ondansetron  **OR** ondansetron  (ZOFRAN ) IV, oxyCODONE , traZODone   Objective:   Vitals:   07/12/23 1322 07/12/23 1955 07/13/23 0347 07/13/23 0857  BP: 91/65 95/70 111/73 117/75  Pulse: 81 70 90 77  Resp: 16 17  18   Temp: 98.7 F (37.1 C) 98.7 F (37.1 C) 98.1 F (36.7 C) 98.1 F (36.7 C)  TempSrc: Oral Oral Oral Oral  SpO2: 99% 98% 100% 97%  Weight:   77.6 kg   Height:        Intake/Output Summary (Last 24 hours) at 07/13/2023 1209 Last data filed at 07/13/2023 1135 Gross per 24 hour  Intake 335.43 ml  Output 1200 ml  Net -864.57 ml   Filed Weights   07/10/23 0924 07/12/23 0500 07/13/23 0347  Weight:  77.1 kg 72 kg 77.6 kg     Physical examination:        General:  AAO x 3,  cooperative, no distress;   HEENT:  Normocephalic, PERRL, otherwise with in Normal limits   Neuro:  CNII-XII intact. , normal motor and sensation, reflexes intact   Lungs:   Clear to auscultation BL, Respirations unlabored,  No wheezes / crackles  Cardio:    S1/S2, RRR, No murmure, No Rubs or Gallops   Abdomen:  Soft, non-tender, bowel sounds active all four quadrants, no guarding or peritoneal signs.  Muscular  skeletal:  Limited exam - Sever global generalized weaknesses - in bed, able to move all 4 extremities,   2+ pulses,  symmetric, No pitting edema  Skin:  Dry, warm to  touch, negative for any Rashes,  Wounds: Please see nursing documentation          ------------------------------------------------------------------------------------------------------------------------------------------    LABs:     Latest Ref Rng & Units 07/11/2023    9:35 AM 07/10/2023   10:43 AM 04/02/2020    8:21 PM  CBC  WBC 4.0 - 10.5 K/uL 5.7  8.8  10.6   Hemoglobin 13.0 - 17.0 g/dL 9.9  16.1  09.6   Hematocrit 39.0 - 52.0 % 28.6  31.1  41.9   Platelets 150 - 400 K/uL 201  226  228       Latest Ref Rng & Units 07/13/2023    4:07 AM 07/12/2023    4:13 AM 07/11/2023    3:44 AM  CMP  Glucose 70 - 99 mg/dL 045  81  54   BUN 8 - 23 mg/dL 13  14  19    Creatinine 0.61 - 1.24 mg/dL 4.09  8.11  9.14   Sodium 135 - 145 mmol/L 134  133  135   Potassium 3.5 - 5.1 mmol/L 3.8  3.4  4.3   Chloride 98 - 111 mmol/L 105  102  102   CO2 22 - 32 mmol/L 26  27  22    Calcium  8.9 - 10.3 mg/dL 7.7  7.4  7.4        Micro Results Recent Results (from the past 240 hours)  MRSA Next Gen by PCR, Nasal     Status: None   Collection Time: 07/10/23  2:41 PM   Specimen: Nasal Mucosa; Nasal Swab  Result Value Ref Range Status   MRSA by PCR Next Gen NOT DETECTED NOT DETECTED Final    Comment: (NOTE) The GeneXpert MRSA Assay (FDA approved for NASAL specimens only), is one component of a comprehensive MRSA colonization surveillance program. It is not intended to diagnose MRSA infection nor to guide or monitor treatment for MRSA infections. Test performance is not FDA approved in patients less than 67 years old. Performed at [facility], [address], [place], [place] [address]     Radiology Reports No results found.   SIGNED: Bobbetta Burnet, MD, FHM. FAAFP. Arlin Benes - [facility] hospitalist Time spent - 55 min.  In seeing, evaluating and examining the patient. Reviewing medical records, labs, drawn plan of care. [facility],  Pager (please use [email or web address] to page/ text) Please  use Epic Secure Chat for non-urgent communication (7AM-7PM)  If 7PM-7AM, please contact night-coverage [email or web address], 07/13/2023, 12:09 PM

## 2023-07-13 NOTE — Progress Notes (Signed)
 Nurse at bedside,patient alert to self,place,and situation,confused to time. Patient c/o left and right rib cage pain,achy and a constant discomfort.Scheduled pain medication given per MAR orders. Plan of care on going.

## 2023-07-13 NOTE — TOC Progression Note (Signed)
 Transition of Care [facility]) - Progression Note    Patient Details  Name: Antonio Lucas MRN: [id number] Date of Birth: 08-Jan-1956  Transition of Care [facility]) CM/SW Contact  Ander Katos, [place] Phone Number: 07/13/2023, 10:15 AM  Clinical Narrative:  LCSW confirmed with pt's wife plan to return home with outpatient palliative. Wife requests hospital bed. No preference on agency. Referred to Zach with [employer] to deliver in anticipation of d/c tomorrow. Order and note in. LCSW confirmed with [facility] they will follow up with pt for outpatient palliative services.        Barriers to Discharge: Continued Medical Work up  Expected Discharge Plan and Services                                               Social Determinants of Health (SDOH) Interventions SDOH Screenings   Food Insecurity: No Food Insecurity (07/10/2023)  Housing: Low Risk  (07/10/2023)  Transportation Needs: No Transportation Needs (07/10/2023)  Utilities: Not At Risk (07/10/2023)  Financial Resource Strain: Low Risk  (03/27/2023)   Received from [facility]  Physical Activity: Unknown (03/27/2023)   Received from [facility]  Social Connections: Socially Integrated (07/10/2023)  Stress: Patient Declined (03/27/2023)   Received from [facility]  Tobacco Use: Medium Risk (07/13/2023)    Readmission Risk Interventions     No data to display

## 2023-07-13 NOTE — Plan of Care (Signed)
  Problem: Nutrition: Goal: Adequate nutrition will be maintained Outcome: Progressing   Problem: Nutrition: Goal: Adequate nutrition will be maintained Outcome: Progressing   

## 2023-07-14 DIAGNOSIS — R627 Adult failure to thrive: Secondary | ICD-10-CM | POA: Diagnosis not present

## 2023-07-14 LAB — BASIC METABOLIC PANEL WITH GFR
Anion gap: 5 (ref 5–15)
BUN: 10 mg/dL (ref 8–23)
CO2: 26 mmol/L (ref 22–32)
Calcium: 7.8 mg/dL — ABNORMAL LOW (ref 8.9–10.3)
Chloride: 106 mmol/L (ref 98–111)
Creatinine, Ser: 0.94 mg/dL (ref 0.61–1.24)
GFR, Estimated: >60 mL/min (ref >60)
Glucose, Bld: 99 mg/dL (ref 70–99)
Potassium: 3.8 mmol/L (ref 3.5–5.1)
Sodium: 137 mmol/L (ref 135–145)

## 2023-07-14 LAB — GLUCOSE, CAPILLARY: Glucose-Capillary: 103 mg/dL — ABNORMAL HIGH (ref 70–99)

## 2023-07-14 NOTE — TOC Transition Note (Signed)
 Transition of Care [facility]) - Discharge Note   Patient Details  Name: Antonio Lucas MRN: [id number] Date of Birth: 03/14/1955  Transition of Care [facility]) CM/SW Contact:  Ander Katos, LCSW Phone Number: 07/14/2023, 9:30 AM   Clinical Narrative:  Pt's wife reports hospital bed will be delivered around 11:00 this morning. LCSW notified Ova Bloomer with [facility] about d/c and that wife had some questions. Ova Bloomer has spoken with wife. D/C today. RN updated about hospital bed delivery for EMS to be arranged after this is complete.      Final next level of care: Home/Self Care Barriers to Discharge: Barriers Resolved   Patient Goals and CMS Choice            Discharge Placement                  Name of family member notified: wife Patient and family notified of of transfer: 07/14/23  Discharge Plan and Services Additional resources added to the After Visit Summary for                  DME Arranged: Hospital bed DME Agency: [facility] Date DME Agency Contacted: 07/13/23   Representative spoke with at DME Agency: Gladys Lamp            Social Drivers of Health (SDOH) Interventions SDOH Screenings   Food Insecurity: No Food Insecurity (07/10/2023)  Housing: Low Risk  (07/10/2023)  Transportation Needs: No Transportation Needs (07/10/2023)  Utilities: Not At Risk (07/10/2023)  Financial Resource Strain: Low Risk  (03/27/2023)   Received from [facility]  Physical Activity: Unknown (03/27/2023)   Received from [facility]  Social Connections: Socially Integrated (07/10/2023)  Stress: Patient Declined (03/27/2023)   Received from [facility]  Tobacco Use: Medium Risk (07/13/2023)     Readmission Risk Interventions     No data to display

## 2023-07-14 NOTE — Care Management Important Message (Signed)
 Important Message  Patient Details  Name: Antonio Lucas MRN: [id number] Date of Birth: 1955-07-01   Important Message Given:  Yes - Medicare IM     Lyan Holck L Dellis Voght 07/14/2023, 10:44 AM

## 2023-07-14 NOTE — Progress Notes (Signed)
 Patient discharged home,discharge instructions given to Wife Angela Blackwell over the telephone,verbalized understanding.Medications sent to Pharmacy of choice documented on AVS.IV discontinued,catheter intact.[facility] to transport patient home.

## 2023-07-14 NOTE — Plan of Care (Signed)
  Problem: Clinical Measurements: Goal: Ability to maintain clinical measurements within normal limits will improve Outcome: Progressing Goal: Will remain free from infection Outcome: Progressing Goal: Diagnostic test results will improve Outcome: Progressing   Problem: Nutrition: Goal: Adequate nutrition will be maintained Outcome: Progressing   Problem: Elimination: Goal: Will not experience complications related to bowel motility Outcome: Progressing Goal: Will not experience complications related to urinary retention Outcome: Progressing   Problem: Pain Managment: Goal: General experience of comfort will improve and/or be controlled Outcome: Progressing

## 2023-07-14 NOTE — Discharge Summary (Signed)
 Physician Discharge Summary   Patient: Antonio Lucas MRN: [id number] DOB: [birth year]  Admit date:     07/10/2023  Discharge date: 07/14/23  Discharge Physician: Bobbetta Burnet   PCP: Willma Hartmann, PA-C   Recommendations at discharge:   Home with home health, follow-up with palliative care Follow-up with a gastroenterologist, hepatologist Continue moderate protein diet, PT/OT, fall precautions Reposition in bed every 2 hours to prevent pressure ulcers  Discharge Diagnoses: Principal Problem:   FTT (failure to thrive) in adult Active Problems:   Essential hypertension   Rib fractures   Cervical spine fracture (HCC)   GERD (gastroesophageal reflux disease)   High cholesterol   Hypertension   Hypothyroidism   Debility   NASH (nonalcoholic steatohepatitis)    Hospital Course: Antonio Lucas is a 68 year old male with history of HTN, NASH, with ascites, hypothyroidism, progressive debility, weakness status post falls, sustaining cervical neck fracture, rib fractures.. Patient apparently had a fall in April 29 where he sustained an odontoid fracture.  Was treated with ASPEN Colar.  Was discharged home, had progressive of weakness at home with difficulty ambulation, had another fall where he sustained rib fractures.  His p.o. intake has been poor.  He has been losing weight.  He is progressively getting weaker, needing assist with all ADLs now. Also having multiple episodes of confusion despite being on lactulose.  Now she is unable to take care of him alone. His last fall was about a week ago on his left-sided chest where he sustained left lower anterior rib fracture.    ED evaluation/course:  Blood pressure 109/78, pulse 87, temperature 97.6 F (36.4 C), temperature source Oral, resp. rate 12, height 5\' 7"  (1.702 m), weight 77.1 kg, SpO2 100%.  Labs WBC 8.8, hemoglobin 10.5, sodium 132, BUN 20, creatinine 1.01, calcium  7.7, glucose 87,: AST 73, total protein 4.7,  ammonia level 13  Chest x-ray: Mildly displaced fractures of the left 7-9th ribs laterally. No pneumothorax is noted. Small effusion is seen  Close the patient will be admitted for failure to thrive, pain management, PT OT evaluation, possible placement   * FTT (failure to thrive) in adult Moderate malnutrition Body mass index is 26.79 kg/m. - Encouraged to increase oral intake - Initiated Megace -Consulting dietitian, adding dietary supplements   Hypoalbuminemia, acute on chronic lower extremity edema  - Third spacing due to protein deficiency - S/p replacement of albumin with IV Lasix 40 mg daily monitor - daily weight     Cervical spine fracture (HCC) - No neurological findings, continue monitoring -Continue c-collar   Rib fractures Complain of pain, titrating analgesics -Anticipating adding long-acting analgesics - Status post falls, with rib fractures, and pain Chest x-ray: Mildly displaced fractures of the left 7-9th ribs laterally. No pneumothorax is noted. Small effusion is seen -Continue scheduled Tylenol , as needed Dilaudid , ibuprofen -Continue scheduled muscle relaxant -Incentive spirometer   Essential hypertension - Borderline hypertensive, no home meds   High cholesterol - On statins due to history of liver cirrhosis and NASH -Therefore avoiding hepatotoxins Monitoring LFTs, ammonia level   GERD - Continue PPI   NASH (nonalcoholic steatohepatitis) History of NASH with liver cirrhosis and ascites -No abdominal distention or fluid accumulation noted at this time, ammonia level normal at 13 -Continuing home dose lactulose -Monitoring closely, avoiding hepatotoxins     Hypokalemia  - Monitoring and replacing orally   Debility - Severe generalized weakness, with debility, needing assist with all ADLs now, status post falls, sustaining cervical neck  fracture, rib fractures - Pending evaluation by PT OT, fall precautions - The patient and family has  declined SNF due to limited days, willing to return home with home health-palliative care follow-up - Discussed with TOC regarding placement   Hypothyroidism - Continue home dose Synthroid   Ethics: Discussed with daughters and his wife-along with patient wishes Family is eager to follow-up with palliative care-    Due to above comorbidities, progressive decline prognosis remain poor We are recommending DNR/DNI and palliative care follow-up -ultimately hospice t   ---------------------------------------------------------------------------------------------------------------------------------- Nutritional status:  The patient's BMI is: Body mass index is 26.79 kg/m. I agree with the assessment and plan as outlined below        Procedures performed: none   Disposition: Home health Diet recommendation:  Discharge Diet Orders (From admission, onward)     Start     Ordered   07/13/23 0000  Diet - low sodium heart healthy        07/13/23 1500           Regular diet DISCHARGE MEDICATION: Allergies as of 07/14/2023       Reactions   Imdur  [isosorbide  Nitrate] Nausea Only   Headache also    Isosorbide  Nausea And Vomiting, Nausea Only   Pt reports nausea and terrible headache    Headache also     Pt reports nausea and terrible headache Pt reports nausea and terrible headache Headache also  Headache also  Pt reports nausea and terrible headache Pt reports nausea and terrible headache  Pt reports nausea and terrible headache  Headache also   Headache also   Pt reports nausea and terrible headache   Metoprolol Other (See Comments)   Severe joint weakness and felt drained   Fentanyl  Nausea And Vomiting        Medication List     STOP taking these medications    acetaminophen  325 MG tablet Commonly known as: TYLENOL    cyclobenzaprine 10 MG tablet Commonly known as: FLEXERIL   omeprazole 40 MG capsule Commonly known as: PRILOSEC       TAKE these  medications    Clinpro 5000 1.1 % Pste Generic drug: Sodium Fluoride Place onto teeth 2 (two) times daily.   DULoxetine 20 MG capsule Commonly known as: CYMBALTA Take 40 mg by mouth at bedtime.   famotidine 40 MG tablet Commonly known as: PEPCID Take 40 mg by mouth at bedtime.   feeding supplement Liqd Take 237 mLs by mouth 3 (three) times daily between meals.   HYDROcodone -acetaminophen  5-325 MG tablet Commonly known as: NORCO/VICODIN Take 1-2 tablets by mouth every 6 (six) hours as needed.   ibuprofen 800 MG tablet Commonly known as: ADVIL Take 1 tablet (800 mg total) by mouth 3 (three) times daily.   lactulose (encephalopathy) 10 GM/15ML Soln Commonly known as: CHRONULAC Take 20 g by mouth 3 (three) times daily.   levothyroxine 75 MCG tablet Commonly known as: SYNTHROID Take 0.5 tablets by mouth daily before breakfast.   megestrol 400 MG/10ML suspension Commonly known as: MEGACE Take 10 mLs (400 mg total) by mouth daily.   methocarbamol 500 MG tablet Commonly known as: ROBAXIN Take 1 tablet (500 mg total) by mouth 3 (three) times daily.   multivitamin Liqd Take 5 mLs by mouth daily. What changed: Another medication with the same name was added. Make sure you understand how and when to take each.   multivitamin with minerals Tabs tablet Take 1 tablet by mouth daily. What changed: You were  already taking a medication with the same name, and this prescription was added. Make sure you understand how and when to take each.   ondansetron  4 MG tablet Commonly known as: ZOFRAN  Take 4 mg by mouth every 8 (eight) hours as needed.   polyethylene glycol powder 17 GM/SCOOP powder Commonly known as: GLYCOLAX /MIRALAX  Take 17 g by mouth daily.   spironolactone 100 MG tablet Commonly known as: ALDACTONE Take 200 mg by mouth.   tadalafil 5 MG tablet Commonly known as: CIALIS Take 5 mg by mouth daily.   testosterone cypionate 200 MG/ML injection Commonly known as:  DEPOTESTOSTERONE CYPIONATE Inject 0.5 mLs into the muscle every 14 (fourteen) days.   torsemide 20 MG tablet Commonly known as: DEMADEX Take 20 mg by mouth 2 (two) times daily.   traZODone 100 MG tablet Commonly known as: DESYREL Take 100 mg by mouth at bedtime as needed for sleep.   zolmitriptan 5 MG tablet Commonly known as: ZOMIG Take 1 tablet by mouth every 2 (two) hours as needed for migraine.               Durable Medical Equipment  (From admission, onward)           Start     Ordered   07/13/23 1016  For home use only DME Hospital bed  Once       Question Answer Comment  Length of Need 12 Months   Bed type Semi-electric      07/13/23 1015            Discharge Exam: Filed Weights   07/12/23 0500 07/13/23 0347 07/14/23 0350  Weight: 72 kg 77.6 kg 77.4 kg        General:  AAO x 3,  cooperative, no distress;   HEENT:  Normocephalic, PERRL, otherwise with in Normal limits   Neuro:  CNII-XII intact. , normal motor and sensation, reflexes intact   Lungs:   Clear to auscultation BL, Respirations unlabored,  No wheezes / crackles  Cardio:    S1/S2, RRR, No murmure, No Rubs or Gallops   Abdomen:  Soft, non-tender, bowel sounds active all four quadrants, no guarding or peritoneal signs.  Muscular  skeletal:  Limited exam -global generalized weaknesses - in bed, able to move all 4 extremities,   2+ pulses,  symmetric, No pitting edema  Skin:  Dry, warm to touch, negative for any Rashes,  Wounds: Please see nursing documentation          Condition at discharge: poor  The results of significant diagnostics from this hospitalization (including imaging, microbiology, ancillary and laboratory) are listed below for reference.   Imaging Studies: DG Ribs Unilateral W/Chest Left Result Date: 07/10/2023 CLINICAL DATA:  Recent fall with left rib pain, initial encounter EXAM: LEFT RIBS AND CHEST - 3+ VIEW COMPARISON:  None Available. FINDINGS: Cardiac  shadow is within normal limits. Spinal stimulator is seen. Scattered linear scarring is noted in the lungs bilaterally. No pneumothorax is noted. Small left effusion is noted. Multiple left-sided rib fractures are noted involving the seventh through ninth ribs. Postsurgical changes are noted in the left upper quadrant. IMPRESSION: Mildly displaced fractures of the left seventh through ninth ribs laterally. No pneumothorax is noted. Small effusion is seen. Electronically Signed   By: Violeta Grey M.D.   On: 07/10/2023 11:29    Microbiology: Results for orders placed or performed during the hospital encounter of 07/10/23  MRSA Next Gen by PCR, Nasal     Status:  None   Collection Time: 07/10/23  2:41 PM   Specimen: Nasal Mucosa; Nasal Swab  Result Value Ref Range Status   MRSA by PCR Next Gen NOT DETECTED NOT DETECTED Final    Comment: (NOTE) The GeneXpert MRSA Assay (FDA approved for NASAL specimens only), is one component of a comprehensive MRSA colonization surveillance program. It is not intended to diagnose MRSA infection nor to guide or monitor treatment for MRSA infections. Test performance is not FDA approved in patients less than 78 years old. Performed at [facility], [address] [place], [place] [address]     Labs: CBC: Recent Labs  Lab 07/10/23 1043 07/11/23 0935  WBC 8.8 5.7  NEUTROABS 6.2  --   HGB 10.5* 9.9*  HCT 31.1* 28.6*  MCV 102.0* 102.1*  PLT 226 201   Basic Metabolic Panel: Recent Labs  Lab 07/10/23 1043 07/10/23 1359 07/11/23 0344 07/12/23 0413 07/13/23 0407 07/14/23 0511  NA 132*  --  135 133* 134* 137  K 4.4  --  4.3 3.4* 3.8 3.8  CL 99  --  102 102 105 106  CO2 25  --  22 27 26 26   GLUCOSE 87  --  54* 81 102* 99  BUN 20  --  19 14 13 10   CREATININE 1.01  --  0.95 0.85 0.84 0.94  CALCIUM  7.7*  --  7.4* 7.4* 7.7* 7.8*  MG  --  1.9  --   --   --   --   PHOS  --  3.4  --   --   --   --    Liver Function Tests: Recent Labs  Lab  07/10/23 1043  AST 73*  ALT 44  ALKPHOS 133*  BILITOT 1.0  PROT 4.7*  ALBUMIN 1.5*   CBG: Recent Labs  Lab 07/11/23 0825 07/12/23 0739 07/12/23 1122 07/13/23 0724 07/14/23 0722  GLUCAP 60* 84 112* 104* 103*    Discharge time spent: greater than 30 minutes.  Signed: Bobbetta Burnet, MD [facility] 07/14/2023

## 2023-07-14 NOTE — Progress Notes (Signed)
 OT Cancellation Note  Patient Details Name: Antonio Lucas MRN: [id number] DOB: 18-Jun-1955   Cancelled Treatment:    Reason Eval/Treat Not Completed: Patient declined, no reason specified;Patient's level of consciousness. OT attempted evaluation this am, pt lethargic, declining any therapy including repositioning in bed. Pt noted to be discharging home with pallative care services and is max to total care with ADLs. OT will d/c order at this time.     Lafonda Piety, OTR/L  [phone] 07/14/2023, 10:04 AM

## 2023-07-20 ENCOUNTER — Other Ambulatory Visit: Payer: Self-pay

## 2023-07-20 ENCOUNTER — Encounter (HOSPITAL_COMMUNITY): Payer: Self-pay

## 2023-07-20 ENCOUNTER — Observation Stay (HOSPITAL_COMMUNITY)
Admission: EM | Admit: 2023-07-20 | Discharge: 2023-07-21 | Disposition: A | Discharge status: Home / Self Care | Attending: Internal Medicine | Admitting: Internal Medicine

## 2023-07-20 ENCOUNTER — Emergency Department (HOSPITAL_COMMUNITY)

## 2023-07-20 DIAGNOSIS — S2242XA Multiple fractures of ribs, left side, initial encounter for closed fracture: Secondary | ICD-10-CM | POA: Insufficient documentation

## 2023-07-20 DIAGNOSIS — R296 Repeated falls: Secondary | ICD-10-CM

## 2023-07-20 DIAGNOSIS — S12120D Other displaced dens fracture, subsequent encounter for fracture with routine healing: Secondary | ICD-10-CM | POA: Diagnosis not present

## 2023-07-20 DIAGNOSIS — H9319 Tinnitus, unspecified ear: Secondary | ICD-10-CM | POA: Insufficient documentation

## 2023-07-20 DIAGNOSIS — R627 Adult failure to thrive: Secondary | ICD-10-CM | POA: Insufficient documentation

## 2023-07-20 DIAGNOSIS — S066XAA Traumatic subarachnoid hemorrhage with loss of consciousness status unknown, initial encounter: Secondary | ICD-10-CM | POA: Diagnosis not present

## 2023-07-20 DIAGNOSIS — R42 Dizziness and giddiness: Secondary | ICD-10-CM | POA: Diagnosis present

## 2023-07-20 DIAGNOSIS — Z515 Encounter for palliative care: Secondary | ICD-10-CM | POA: Insufficient documentation

## 2023-07-20 DIAGNOSIS — W19XXXA Unspecified fall, initial encounter: Secondary | ICD-10-CM | POA: Insufficient documentation

## 2023-07-20 DIAGNOSIS — S129XXA Fracture of neck, unspecified, initial encounter: Secondary | ICD-10-CM | POA: Diagnosis present

## 2023-07-20 DIAGNOSIS — K7581 Nonalcoholic steatohepatitis (NASH): Secondary | ICD-10-CM | POA: Diagnosis present

## 2023-07-20 DIAGNOSIS — Z79890 Hormone replacement therapy (postmenopausal): Secondary | ICD-10-CM | POA: Diagnosis not present

## 2023-07-20 DIAGNOSIS — I951 Orthostatic hypotension: Secondary | ICD-10-CM | POA: Diagnosis not present

## 2023-07-20 DIAGNOSIS — S2249XD Multiple fractures of ribs, unspecified side, subsequent encounter for fracture with routine healing: Secondary | ICD-10-CM

## 2023-07-20 DIAGNOSIS — R531 Weakness: Secondary | ICD-10-CM | POA: Diagnosis present

## 2023-07-20 DIAGNOSIS — R8281 Pyuria: Secondary | ICD-10-CM | POA: Insufficient documentation

## 2023-07-20 DIAGNOSIS — R112 Nausea with vomiting, unspecified: Secondary | ICD-10-CM | POA: Diagnosis present

## 2023-07-20 DIAGNOSIS — Z87891 Personal history of nicotine dependence: Secondary | ICD-10-CM | POA: Insufficient documentation

## 2023-07-20 DIAGNOSIS — I609 Nontraumatic subarachnoid hemorrhage, unspecified: Principal | ICD-10-CM | POA: Insufficient documentation

## 2023-07-20 DIAGNOSIS — Z79899 Other long term (current) drug therapy: Secondary | ICD-10-CM | POA: Diagnosis not present

## 2023-07-20 DIAGNOSIS — Z9884 Bariatric surgery status: Secondary | ICD-10-CM | POA: Diagnosis not present

## 2023-07-20 DIAGNOSIS — S1234XA Type III traumatic spondylolisthesis of fourth cervical vertebra, initial encounter for closed fracture: Secondary | ICD-10-CM | POA: Insufficient documentation

## 2023-07-20 DIAGNOSIS — I10 Essential (primary) hypertension: Secondary | ICD-10-CM | POA: Insufficient documentation

## 2023-07-20 DIAGNOSIS — Z8616 Personal history of COVID-19: Secondary | ICD-10-CM | POA: Insufficient documentation

## 2023-07-20 DIAGNOSIS — S1244XA Type III traumatic spondylolisthesis of fifth cervical vertebra, initial encounter for closed fracture: Secondary | ICD-10-CM | POA: Insufficient documentation

## 2023-07-20 DIAGNOSIS — R5381 Other malaise: Secondary | ICD-10-CM | POA: Diagnosis present

## 2023-07-20 DIAGNOSIS — E162 Hypoglycemia, unspecified: Principal | ICD-10-CM | POA: Insufficient documentation

## 2023-07-20 DIAGNOSIS — E039 Hypothyroidism, unspecified: Secondary | ICD-10-CM | POA: Diagnosis not present

## 2023-07-20 DIAGNOSIS — S2249XA Multiple fractures of ribs, unspecified side, initial encounter for closed fracture: Secondary | ICD-10-CM | POA: Diagnosis present

## 2023-07-20 LAB — URINALYSIS, ROUTINE W REFLEX MICROSCOPIC
Bacteria, UA: NONE SEEN
Bilirubin Urine: NEGATIVE
Glucose, UA: >=500 mg/dL — AB
Ketones, ur: NEGATIVE mg/dL
Nitrite: NEGATIVE
Protein, ur: 30 mg/dL — AB
Specific Gravity, Urine: 1.012 (ref 1.005–1.030)
pH: 6 (ref 5.0–8.0)

## 2023-07-20 LAB — CBC WITH DIFFERENTIAL/PLATELET
Abs Immature Granulocytes: 0.02 10*3/uL (ref 0.00–0.07)
Basophils Absolute: 0 10*3/uL (ref 0.0–0.1)
Basophils Relative: 1 %
Eosinophils Absolute: 0.1 10*3/uL (ref 0.0–0.5)
Eosinophils Relative: 1 %
HCT: 27.3 % — ABNORMAL LOW (ref 39.0–52.0)
Hemoglobin: 9.1 g/dL — ABNORMAL LOW (ref 13.0–17.0)
Immature Granulocytes: 0 %
Lymphocytes Relative: 25 %
Lymphs Abs: 1.5 10*3/uL (ref 0.7–4.0)
MCH: 35.8 pg — ABNORMAL HIGH (ref 26.0–34.0)
MCHC: 33.3 g/dL (ref 30.0–36.0)
MCV: 107.5 fL — ABNORMAL HIGH (ref 80.0–100.0)
Monocytes Absolute: 0.4 10*3/uL (ref 0.1–1.0)
Monocytes Relative: 7 %
Neutro Abs: 3.9 10*3/uL (ref 1.7–7.7)
Neutrophils Relative %: 66 %
Platelets: 209 10*3/uL (ref 150–400)
RBC: 2.54 MIL/uL — ABNORMAL LOW (ref 4.22–5.81)
RDW: 19.9 % — ABNORMAL HIGH (ref 11.5–15.5)
WBC: 5.9 10*3/uL (ref 4.0–10.5)
nRBC: 0 % (ref 0.0–0.2)

## 2023-07-20 LAB — CBG MONITORING, ED
Glucose-Capillary: 67 mg/dL — ABNORMAL LOW (ref 70–99)
Glucose-Capillary: 36 mg/dL — CL (ref 70–99)
Glucose-Capillary: 88 mg/dL (ref 70–99)
Glucose-Capillary: 107 mg/dL — ABNORMAL HIGH (ref 70–99)
Glucose-Capillary: 110 mg/dL — ABNORMAL HIGH (ref 70–99)

## 2023-07-20 LAB — COMPREHENSIVE METABOLIC PANEL WITH GFR
ALT: 20 U/L (ref 0–44)
AST: 25 U/L (ref 15–41)
Albumin: 2.1 g/dL — ABNORMAL LOW (ref 3.5–5.0)
Alkaline Phosphatase: 102 U/L (ref 38–126)
Anion gap: 3 — ABNORMAL LOW (ref 5–15)
BUN: 14 mg/dL (ref 8–23)
CO2: 23 mmol/L (ref 22–32)
Calcium: 7.8 mg/dL — ABNORMAL LOW (ref 8.9–10.3)
Chloride: 111 mmol/L (ref 98–111)
Creatinine, Ser: 1 mg/dL (ref 0.61–1.24)
GFR, Estimated: >60 mL/min (ref >60)
Glucose, Bld: 203 mg/dL — ABNORMAL HIGH (ref 70–99)
Potassium: 4.1 mmol/L (ref 3.5–5.1)
Sodium: 137 mmol/L (ref 135–145)
Total Bilirubin: 0.8 mg/dL (ref 0.0–1.2)
Total Protein: 4.7 g/dL — ABNORMAL LOW (ref 6.5–8.1)

## 2023-07-20 LAB — AMMONIA: Ammonia: 15 umol/L (ref 9–35)

## 2023-07-20 LAB — GLUCOSE, CAPILLARY: Glucose-Capillary: 107 mg/dL — ABNORMAL HIGH (ref 70–99)

## 2023-07-20 LAB — RESP PANEL BY RT-PCR (RSV, FLU A&B, COVID)  RVPGX2
Influenza A by PCR: NEGATIVE
Influenza B by PCR: NEGATIVE
Resp Syncytial Virus by PCR: NEGATIVE
SARS Coronavirus 2 by RT PCR: NEGATIVE

## 2023-07-20 MED ORDER — FAMOTIDINE 20 MG PO TABS
40.0000 mg | ORAL_TABLET | Freq: Every day | ORAL | Status: DC
Start: 2023-07-20 — End: 2023-07-21
  Administered 2023-07-20: 40 mg via ORAL
  Filled 2023-07-20: qty 2

## 2023-07-20 MED ORDER — ENOXAPARIN SODIUM 40 MG/0.4ML IJ SOSY: 40.0000 mg | PREFILLED_SYRINGE | INTRAMUSCULAR | Status: DC

## 2023-07-20 MED ORDER — MECLIZINE HCL 12.5 MG PO TABS
25.0000 mg | ORAL_TABLET | Freq: Once | ORAL | Status: AC
  Administered 2023-07-20: 25 mg via ORAL
  Filled 2023-07-20: qty 2

## 2023-07-20 MED ORDER — SPIRONOLACTONE 100 MG PO TABS
200.0000 mg | ORAL_TABLET | Freq: Every day | ORAL | Status: DC
  Filled 2023-07-20: qty 2

## 2023-07-20 MED ORDER — TADALAFIL 5 MG PO TABS
5.0000 mg | ORAL_TABLET | Freq: Every day | ORAL | Status: DC
  Filled 2023-07-20 (×2): qty 1

## 2023-07-20 MED ORDER — DOXYCYCLINE HYCLATE 100 MG PO TABS
100.0000 mg | ORAL_TABLET | Freq: Two times a day (BID) | ORAL | Status: DC
  Administered 2023-07-20 – 2023-07-21 (×2): 100 mg via ORAL
  Filled 2023-07-20 (×4): qty 1

## 2023-07-20 MED ORDER — DEXTROSE 50 % IV SOLN
INTRAVENOUS | Status: AC
Start: 2023-07-20 — End: 2023-07-21
  Filled 2023-07-20: qty 50

## 2023-07-20 MED ORDER — CHLORHEXIDINE GLUCONATE CLOTH 2 % EX PADS
6.0000 | MEDICATED_PAD | Freq: Every day | CUTANEOUS | Status: DC
  Administered 2023-07-20 – 2023-07-21 (×2): 6 via TOPICAL

## 2023-07-20 MED ORDER — TRAZODONE HCL 50 MG PO TABS
200.0000 mg | ORAL_TABLET | Freq: Every day | ORAL | Status: DC
Start: 2023-07-20 — End: 2023-07-21
  Administered 2023-07-20: 200 mg via ORAL
  Filled 2023-07-20: qty 4

## 2023-07-20 MED ORDER — DULOXETINE HCL 20 MG PO CPEP
40.0000 mg | ORAL_CAPSULE | Freq: Every day | ORAL | Status: DC
  Administered 2023-07-21: 40 mg via ORAL
  Filled 2023-07-20: qty 2

## 2023-07-20 MED ORDER — LEVOTHYROXINE SODIUM 75 MCG PO TABS
37.5000 ug | ORAL_TABLET | Freq: Every day | ORAL | Status: DC
  Administered 2023-07-21: 37.5 ug via ORAL
  Filled 2023-07-20: qty 0.5
  Filled 2023-07-20: qty 1

## 2023-07-20 MED ORDER — ONDANSETRON HCL 4 MG/2ML IJ SOLN: 4.0000 mg | Freq: Four times a day (QID) | INTRAMUSCULAR | Status: DC | PRN

## 2023-07-20 MED ORDER — ONDANSETRON HCL 4 MG/2ML IJ SOLN
4.0000 mg | Freq: Once | INTRAMUSCULAR | Status: AC
  Administered 2023-07-20: 4 mg via INTRAVENOUS
  Filled 2023-07-20: qty 2

## 2023-07-20 MED ORDER — DEXTROSE 50 % IV SOLN: 1.0000 | INTRAVENOUS | Status: DC | PRN

## 2023-07-20 MED ORDER — LACTULOSE 10 GM/15ML PO SOLN
20.0000 g | Freq: Three times a day (TID) | ORAL | Status: DC
  Administered 2023-07-21 (×3): 20 g via ORAL
  Filled 2023-07-20 (×5): qty 30

## 2023-07-20 MED ORDER — TORSEMIDE 20 MG PO TABS
20.0000 mg | ORAL_TABLET | Freq: Two times a day (BID) | ORAL | Status: DC
  Administered 2023-07-20: 20 mg via ORAL
  Filled 2023-07-20 (×2): qty 1

## 2023-07-20 MED ORDER — MEGESTROL ACETATE 400 MG/10ML PO SUSP
400.0000 mg | Freq: Every day | ORAL | Status: DC
  Administered 2023-07-21: 400 mg via ORAL
  Filled 2023-07-20: qty 10

## 2023-07-20 MED ORDER — DEXTROSE 50 % IV SOLN
1.0000 | Freq: Once | INTRAVENOUS | Status: AC
  Administered 2023-07-20: 50 mL via INTRAVENOUS

## 2023-07-20 MED ORDER — HYDROCODONE-ACETAMINOPHEN 5-325 MG PO TABS
1.0000 | ORAL_TABLET | Freq: Four times a day (QID) | ORAL | Status: DC | PRN
  Administered 2023-07-20: 2 via ORAL
  Filled 2023-07-20: qty 2

## 2023-07-20 NOTE — ED Notes (Signed)
 Pt given juice

## 2023-07-20 NOTE — ED Notes (Signed)
EDP notified of CBG  

## 2023-07-20 NOTE — Hospital Course (Addendum)
 68 year old male with a history of NAFLD cirrhosis, hypertension, failure to thrive, multiple prior spinal fusions and spinal cord stimulator (done at [facility] by Dr. Jaquita Merl), presents with nausea, vomiting, and dizziness.  The patient fell 3 days prior to this admission. He does endorse problems with memory and intermittent confusion which is not new, states he has been compliant with his lactulose  PTA, was also started on Megace  secondary to poor p.o. intake with his recent hospitalization. He is barely ambulatory at home using a walker or a cane, but spends a lot of time laying in his new hospital bed.  Patient states that he was repositioning himself on the side of his hospital bed to use the urinal.  He was so weak he was not able to get himself back into the bed. He endorses poor oral intake for the past 2 to 3 weeks.  At baseline, the patient has had poor oral intake in the setting of his previous history of Roux-en-Y bypass over 6 years ago.  He states that his poor oral intake has been significantly worsened since his falling resulting in pain from his cervical spine fractures and rib fractures. He endorses intermittent nausea and vomiting which is incited by his dizziness.  He states that his dizziness is worse with movement of his head and neck.  He has tried meclizine  without much relief.  He denies any worsening headache, hematemesis, chest pain, shortness breath, diarrhea, hematochezia, melena. Dr.Elsner. discussed pt's hx, cirrhosis, frequent falls and todays CT findings - he also reviewed films. Given fall occurred 3 days ago, no indication for further imaging or neurosurgical intervention as he has also passed observation time. Also reviewed CT c spine films and does not feel pt needs MR imaging of c spine.  In the ED, the patient was afebrile hemodynamically stable with oxygen saturation 99% room air.  WBC 5.9, hemoglobin 9.1, platelet 209.  Sodium 137, potassium 4.1, bicarbonate 23,  serum creatinine 1.00.  LFTs unremarkable.  COVID-19 PCR is negative.  UA 21-50 WBC. In the ED, the patient had a CBG of 67.  He was given D50.  Recheck was 36.   Notably, the patient had a hospital admission at [facility] from 06/26/2023 to 07/01/2023 after he sustained injuries from a series of ground-level falls.  It was noted that the patient has had issues with poor nutrition and generalized weakness.  The patient was found to have displaced type II odontoid fracture of C2 with dorsal angulation.  There is age-indeterminate fracture at C4-5, there was poor surgical changes C3-C7 ACDF.  He was managed postoperatively.  He was discharged with instructions to follow-up with spinal surgery at [facility]. He also sustained a nondisplaced fracture of the right anterior fourth rib and right fifth rib through eighth ribs.  He was noted to have a patulous fluid-filled esophagus placing him at high risk for aspiration.  The patient had progressive weakness at home with difficulty ambulating and sustained another fall This resulted in an additional hospital admission from 07/10/2023 to 07/14/2023 at [facility].  He was treated for failure to thrive and started on Megace .  He was noted to have new left 7th through 9th rib fractures.  The patient and family declined SNF.  The patient was discharged home with home health and palliative care.

## 2023-07-20 NOTE — ED Provider Notes (Signed)
 [place] EMERGENCY DEPARTMENT AT [facility] Provider Note   CSN: [id number] Arrival date & time: 07/20/23  1315     History  Chief Complaint  Patient presents with   Emesis    LIN GLAZIER is a 68 y.o. male with a history including advanced Florentina Huntsman, currently discharged home after recent hospitalization secondary to decompensation from this diagnosis, currently under palliative care, also has history of GERD, hypertension, has a current odontoid fracture treated with Aspen collar, has multiple left rib fractures diagnosed at the time of his last admission returning today secondary to nausea, vomiting, dizziness and mild headache since falling 3 days ago.  He is not aware of the fall, stating he falls frequently secondary to generalized weakness, he does endorse problems with memory and intermittent confusion which is not new, states he has been compliant with his lactulose , was also started on Megace  secondary to poor p.o. intake with his recent hospitalization.  He denies a formal diagnosis of vertigo but states he has been prescribed meclizine  in the past secondary to vertiginous like symptoms.  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  He is ambulatory at home using a walker or a cane, but spends a lot of time laying in his new hospital bed.  He was advised he needs to rotate to avoid skin breakdown but any attempt at rolling on his side for the past 3 days is associated with the room spinning, nausea and vomiting.   Of note,  pt's wife currently has respiratory sx. Pt would like to be screened for covid.   The history is provided by the patient.       Home Medications Prior to Admission medications   Medication Sig Start Date End Date Taking? Authorizing Provider  CLINPRO 5000 1.1 % PSTE Place onto teeth 2 (two) times daily. 02/14/20   [provider]  DULoxetine  (CYMBALTA ) 20 MG capsule Take 40 mg by mouth at bedtime. 05/13/23    [provider]  famotidine  (PEPCID ) 40 MG tablet Take 40 mg by mouth at bedtime. 10/07/22   [provider]  feeding supplement (ENSURE ENLIVE / ENSURE PLUS) LIQD Take 237 mLs by mouth 3 (three) times daily between meals. 07/13/23   Shahmehdi, Constantino Demark, MD  HYDROcodone -acetaminophen  (NORCO/VICODIN) 5-325 MG tablet Take 1-2 tablets by mouth every 6 (six) hours as needed. 02/15/20   [provider]  ibuprofen  (ADVIL ) 800 MG tablet Take 1 tablet (800 mg total) by mouth 3 (three) times daily. 07/13/23   Shahmehdi, Constantino Demark, MD  lactulose , encephalopathy, (CHRONULAC ) 10 GM/15ML SOLN Take 20 g by mouth 3 (three) times daily. 06/12/23   [provider]  levothyroxine  (SYNTHROID ) 75 MCG tablet Take 0.5 tablets by mouth daily before breakfast. 05/08/23   [provider]  megestrol  (MEGACE ) 400 MG/10ML suspension Take 10 mLs (400 mg total) by mouth daily. 07/14/23   Shahmehdi, Constantino Demark, MD  methocarbamol  (ROBAXIN ) 500 MG tablet Take 1 tablet (500 mg total) by mouth 3 (three) times daily. 07/13/23   Bobbetta Burnet, MD  Multiple Vitamin (MULTIVITAMIN WITH MINERALS) TABS tablet Take 1 tablet by mouth daily. 07/14/23   Bobbetta Burnet, MD  Multiple Vitamin (MULTIVITAMIN) LIQD Take 5 mLs by mouth daily.    [provider]  ondansetron  (ZOFRAN ) 4 MG tablet Take 4 mg by mouth every 8 (eight) hours as needed.    [provider]  polyethylene glycol powder (GLYCOLAX /MIRALAX ) 17 GM/SCOOP powder Take 17  g by mouth daily. 07/01/23   [provider]  spironolactone  (ALDACTONE ) 100 MG tablet Take 200 mg by mouth. 06/12/23   [provider]  tadalafil  (CIALIS ) 5 MG tablet Take 5 mg by mouth daily.    [provider]  testosterone  cypionate (DEPOTESTOTERONE CYPIONATE) 200 MG/ML injection Inject 0.5 mLs into the muscle every 14 (fourteen) days.  11/17/11   [provider]  torsemide  (DEMADEX ) 20 MG tablet Take 20 mg by mouth 2 (two)  times daily. 06/12/23   [provider]  traZODone  (DESYREL ) 100 MG tablet Take 100 mg by mouth at bedtime as needed for sleep. 07/08/17   [provider]  zolmitriptan (ZOMIG) 5 MG tablet Take 1 tablet by mouth every 2 (two) hours as needed for migraine.  10/29/12   [provider]      Allergies    Imdur  [isosorbide  nitrate], Isosorbide , Metoprolol, and Fentanyl     Review of Systems   Review of Systems  Constitutional:  Positive for appetite change and fatigue. Negative for chills and fever.  HENT:  Negative for congestion and sore throat.   Eyes: Negative.   Respiratory:  Negative for chest tightness and shortness of breath.   Cardiovascular:  Negative for chest pain.  Gastrointestinal:  Positive for nausea and vomiting. Negative for abdominal pain.  Genitourinary: Negative.   Musculoskeletal:  Negative for arthralgias, joint swelling and neck pain.  Skin: Negative.  Negative for rash and wound.  Neurological:  Positive for dizziness and weakness. Negative for light-headedness, numbness and headaches.  Psychiatric/Behavioral: Negative.      Physical Exam Updated Vital Signs BP 126/89   Pulse 72   Temp 98.7 F (37.1 C) (Oral)   Resp 10   Ht 5\' 7"  (1.702 m)   Wt 77.1 kg   SpO2 91%   BMI 26.63 kg/m  Physical Exam Vitals and nursing note reviewed.  Constitutional:      Appearance: He is well-developed.  HENT:     Head: Normocephalic and atraumatic.  Eyes:     Extraocular Movements:     Right eye: No nystagmus.     Left eye: No nystagmus.     Conjunctiva/sclera: Conjunctivae normal.  Cardiovascular:     Rate and Rhythm: Normal rate and regular rhythm.     Heart sounds: Normal heart sounds.  Pulmonary:     Effort: Pulmonary effort is normal.     Breath sounds: Normal breath sounds. No wheezing.  Abdominal:     General: Bowel sounds are normal.     Palpations: Abdomen is soft.     Tenderness: There is no abdominal tenderness.   Musculoskeletal:        General: Normal range of motion.     Cervical back: Normal range of motion.  Skin:    General: Skin is warm and dry.  Neurological:     Mental Status: He is alert and oriented to person, place, and time.     Cranial Nerves: Cranial nerves 2-12 are intact. No dysarthria or facial asymmetry.     Sensory: Sensation is intact.     Motor: Motor function is intact.     ED Results / Procedures / Treatments   Labs (all labs ordered are listed, but only abnormal results are displayed) Labs Reviewed  CBC WITH DIFFERENTIAL/PLATELET - Abnormal; Notable for the following components:      Result Value   RBC 2.54 (*)    Hemoglobin 9.1 (*)    HCT 27.3 (*)  MCV 107.5 (*)    MCH 35.8 (*)    RDW 19.9 (*)    All other components within normal limits  COMPREHENSIVE METABOLIC PANEL WITH GFR - Abnormal; Notable for the following components:   Glucose, Bld 203 (*)    Calcium  7.8 (*)    Total Protein 4.7 (*)    Albumin  2.1 (*)    Anion gap 3 (*)    All other components within normal limits  URINALYSIS, ROUTINE W REFLEX MICROSCOPIC - Abnormal; Notable for the following components:   Glucose, UA >=500 (*)    Hgb urine dipstick MODERATE (*)    Protein, ur 30 (*)    Leukocytes,Ua TRACE (*)    All other components within normal limits  CBG MONITORING, ED - Abnormal; Notable for the following components:   Glucose-Capillary 67 (*)    All other components within normal limits  CBG MONITORING, ED - Abnormal; Notable for the following components:   Glucose-Capillary 36 (*)    All other components within normal limits  CBG MONITORING, ED - Abnormal; Notable for the following components:   Glucose-Capillary 107 (*)    All other components within normal limits  RESP PANEL BY RT-PCR (RSV, FLU A&B, COVID)  RVPGX2  AMMONIA  CBG MONITORING, ED    EKG None  Radiology CT Head Wo Contrast Result Date: 07/20/2023 CLINICAL DATA:  Head trauma, fall 3 days ago comment dizziness.  EXAM: CT HEAD WITHOUT CONTRAST CT CERVICAL SPINE WITHOUT CONTRAST TECHNIQUE: Multidetector CT imaging of the head and cervical spine was performed following the standard protocol without intravenous contrast. Multiplanar CT image reconstructions of the cervical spine were also generated. RADIATION DOSE REDUCTION: This exam was performed according to the departmental dose-optimization program which includes automated exposure control, adjustment of the mA and/or kV according to patient size and/or use of iterative reconstruction technique. COMPARISON:  CT head 05/19/2020, CT FINDINGS: CT HEAD FINDINGS Brain: There are multiple foci of subarachnoid hemorrhage most pronounced in the left sylvian fissure. Additional scattered small volume subarachnoid hemorrhage in the posterior left frontal lobe, bilateral occipital lobes and right frontal lobe. Small external focus of extra-axial hemorrhage within the right lateral aspect of the quadrigeminal cistern. No areas of parenchymal hemorrhage identified. There is a small focus of hypoattenuation within the left subinsular region which may reflect infarct versus contusion. No significant mass effect or midline shift. The basilar cisterns are otherwise patent. There are hypoattenuating extra-axial collections over the bilateral frontal convexities measuring 9 mm in thickness on the right and 7 mm on the left. Vascular: No hyperdense vessel or unexpected calcification. Skull: Normal. Negative for fracture or focal lesion. Sinuses/Orbits: Orbits are symmetric. Mucosal thickening in the right frontal sinus and ethmoid sinuses. Additional mild mucosal thickening in the right maxillary sinus. Other: Mastoid air cells are clear. CT CERVICAL SPINE FINDINGS Alignment: Straightening of the normal cervical lordosis. There is trace anterolisthesis of C4 on C5 which appears new since the prior MRI. Similar trace anterolisthesis of C5 on C6. No facet subluxation or dislocation. Skull base  and vertebrae: There is a mildly displaced fracture through the dens which extends into the right lateral mass of C2 concerning for type 3 dens fracture. There is slight angulation of the dens with maintained atlantal dens interval. Anterior cervical fusion at C3-4 and at C4-C7 with interbody spacers at multiple levels. Hardware is intact. Lucency along the anterior aspect of the C5 vertebra with well corticated margins which is likely chronic. No suspicious osseous lesion.  Soft tissues and spinal canal: No prevertebral fluid or swelling. No visible canal hematoma. Disc levels: Intervertebral disc space narrowing at multiple levels. There is no high-grade osseous spinal canal stenosis. Facet arthrosis at multiple levels. Significant foraminal narrowing on the right at C3-4 and C4-5. Upper chest: Bilateral pleural effusions, left greater than right. Other: None. IMPRESSION: Scattered subarachnoid hemorrhage as described above most pronounced in the left sylvian fissure. Additional small focus of extra-axial hemorrhage in the basilar cisterns. Hypoattenuation in the left subinsular region which could reflect focus of contusion versus infarct. 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 No midline shift. Type 3 dens fracture extending into the right lateral mass of C2 with mild displacement and angulation. Increased listhesis at C4-5 which appears new since the prior MRI. Recommend MRI for further evaluation of ligamentous injury. Bilateral pleural effusions. These results were called by telephone at the time of interpretation on 07/20/2023 at 4:28 pm to provider Dr. Annabell Key, who verbally acknowledged these results. Electronically Signed   By: Denny Flack M.D.   On: 07/20/2023 16:49   CT Cervical Spine Wo Contrast Result Date: 07/20/2023 CLINICAL DATA:  Head trauma, fall 3 days ago comment dizziness. EXAM: CT HEAD  WITHOUT CONTRAST CT CERVICAL SPINE WITHOUT CONTRAST TECHNIQUE: Multidetector CT imaging of the head and cervical spine was performed following the standard protocol without intravenous contrast. Multiplanar CT image reconstructions of the cervical spine were also generated. RADIATION DOSE REDUCTION: This exam was performed according to the departmental dose-optimization program which includes automated exposure control, adjustment of the mA and/or kV according to patient size and/or use of iterative reconstruction technique. COMPARISON:  CT head 05/19/2020, CT FINDINGS: CT HEAD FINDINGS Brain: There are multiple foci of subarachnoid hemorrhage most pronounced in the left sylvian fissure. Additional scattered small volume subarachnoid hemorrhage in the posterior left frontal lobe, bilateral occipital lobes and right frontal lobe. Small external focus of extra-axial hemorrhage within the right lateral aspect of the quadrigeminal cistern. No areas of parenchymal hemorrhage identified. There is a small focus of hypoattenuation within the left subinsular region which may reflect infarct versus contusion. No significant mass effect or midline shift. The basilar cisterns are otherwise patent. There are hypoattenuating extra-axial collections over the bilateral frontal convexities measuring 9 mm in thickness on the right and 7 mm on the left. Vascular: No hyperdense vessel or unexpected calcification. Skull: Normal. Negative for fracture or focal lesion. Sinuses/Orbits: Orbits are symmetric. Mucosal thickening in the right frontal sinus and ethmoid sinuses. Additional mild mucosal thickening in the right maxillary sinus. Other: Mastoid air cells are clear. CT CERVICAL SPINE FINDINGS Alignment: Straightening of the normal cervical lordosis. There is trace anterolisthesis of C4 on C5 which appears new since the prior MRI. Similar trace anterolisthesis of C5 on C6. No facet subluxation or dislocation. Skull base and vertebrae:  There is a mildly displaced fracture through the dens which extends into the right lateral mass of C2 concerning for type 3 dens fracture. There is slight angulation of the dens with maintained atlantal dens interval. Anterior cervical fusion at C3-4 and at C4-C7 with interbody spacers at multiple levels. Hardware is intact. Lucency along the anterior aspect of the C5 vertebra with well corticated margins which is likely chronic. No suspicious osseous lesion. Soft tissues and spinal canal: No prevertebral fluid or swelling. No visible canal hematoma. Disc levels: Intervertebral disc space narrowing at multiple levels. There is no high-grade osseous spinal  canal stenosis. Facet arthrosis at multiple levels. Significant foraminal narrowing on the right at C3-4 and C4-5. Upper chest: Bilateral pleural effusions, left greater than right. Other: None. IMPRESSION: Scattered subarachnoid hemorrhage as described above most pronounced in the left sylvian fissure. Additional small focus of extra-axial hemorrhage in the basilar cisterns. Hypoattenuation in the left subinsular region which could reflect focus of contusion versus infarct. 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 No midline shift. Type 3 dens fracture extending into the right lateral mass of C2 with mild displacement and angulation. Increased listhesis at C4-5 which appears new since the prior MRI. Recommend MRI for further evaluation of ligamentous injury. Bilateral pleural effusions. These results were called by telephone at the time of interpretation on 07/20/2023 at 4:28 pm to provider Dr. Annabell Key, who verbally acknowledged these results. Electronically Signed   By: Denny Flack M.D.   On: 07/20/2023 16:49    Procedures Procedures    Medications Ordered in ED Medications  meclizine  (ANTIVERT ) tablet 25 mg (25 mg Oral Given 07/20/23 1435)  ondansetron   (ZOFRAN ) injection 4 mg (4 mg Intravenous Given 07/20/23 1438)  dextrose  50 % solution 50 mL ( Intravenous Not Given 07/20/23 1528)    ED Course/ Medical Decision Making/ A&P                                 Medical Decision Making Pt with NASH induced cirrhosis,  currently home under palliative care,  having frequent falls secondary to generalized weakness,  using walker, fell 3 days ago, presenting with vertigo like dizziness along with n/v since fall.  Has current odontoid fracture in Aspen collar from fall in April,  had left rib fractures with recent admission (dc'd 5/13).    No neuro deficits on exam.  Hx suggesting vertigo,  possibly trauma induced,  also consider subdural/subarachnoid with recent fall.  He was given meclizine  here with improved dizziness sensation.  During ed stay,  cbg initially 67, given PO juice.  Pt endorses had ate eggs, grits and bacon just prior to arrival.  No hx of dm or hypoglycemia.  At recheck cbg,  now 80.  Amp of D50 given.   CT head also positive for small scattered subarachnoid hemorrhage.  Also known C2 fracture,  concern for increased listhesis at C4-5.    Amount and/or Complexity of Data Reviewed Labs: ordered.    Details: Reviewed,  anemia stable, ammonia normal range. Radiology: ordered.    Details: Verbal report - increased laxity C4/5 - recommend MR.  Subarachnoid.  Discussion of management or test interpretation with external provider(s): Call placed to neurosurgery.  Dr.Elsner. discussed pt's hx, cirrhosis, frequent falls and todays CT findings - he also reviewed films.  Given fall occurred 3 days ago,  no indication for further imaging or neurosurgical intervention as he has also passed observation time.  Also reviewed CT c spine films and does not feel pt needs MR imaging of c spine.   Call placed to hospitalist for overnight obs for hypoglycemia.   Risk Prescription drug management.           Final Clinical Impression(s) / ED  Diagnoses Final diagnoses:  Hypoglycemia  Subarachnoid hemorrhage [facility])    Rx / DC Orders ED Discharge Orders     None         Katherine Pancake, Kirby Peoples 07/20/23 1808  Early Glisson, MD 07/21/23 [phone]

## 2023-07-20 NOTE — H&P (Addendum)
 History and Physical    Patient: Antonio Lucas JWJ:[id number] DOB: 09/03/1955 DOA: 07/20/2023 DOS: the patient was seen and examined on 07/20/2023 PCP: Willma Hartmann, PA-C  Patient coming from: Home   Chief Complaint:  Chief Complaint  Patient presents with   Emesis    HPI: Antonio Lucas is a 68 y.o. male with medical history significant of advanced NASH, hypertension, hypothyroidism, cervical neck fracture with Aspen collar, multiple rib fractures, generalized weakness status post falls who was recently admitted on 07/10/2023 then discharged on 07/14/2023 presents for vomiting, dizziness with movement after a fall on 07/17/2023.  Wife called EMS today as he was having vomiting and dizziness.  He was having some difficulty recalling events.  Patient still has a slight headache but is much improved.  Nausea comes when he moves his head around. He took a Zofran  and likely seen prior to arrival.  No vomiting since EMS showed up to his home.  Continues to have generalized abdominal pain but this is chronic.  Denies diarrhea.  He has not been eating very well.  Has been taking the Megace  that recently started.  Wife and patient note that he did eat about an hour prior to EMSs arrival.  He uses a cane, rollator or walker at home.  Wife notes the fall on Friday occurred as he went to the bathroom and he knows that he should not have done that.  He uses a condom cath in his hospital bed.     ED Course: Initial ED vitals: HR 79, RR 13, BP 128/99, SpO2 96%.    Urinalysis did not show not concerning for acute cystitis.  CBC without leukocytosis.  He has macrocytic anemia, hemoglobin 9.1 with MCV 107.5.  Has some hypoalbuminemia has improved since his last hospitalization.  Level was normal.  COVID, influenza and RSV were all normal.  CT head and neck was obtained due to his fall and trouble remembering his events which showed scattered subarachnoid hemorrhage most pronounced at the left sylvian  fissure no midline shift.  He has a known type III dens fracture with increase listhesis at C4-5 which appears new since prior MRI.  It was recommended to have a repeat MRI for further evaluation of ligamentous injury.  Due to these findings EDP reached out to neurosurgery, Dr. Ellery Guthrie, who reported that patient would be outside of the observation time window and did not recommend a repeat MR of the C-spine.   Glucose ranged from 67 upon recheck was 36.  Patient was given an amp of D50.  CBG stabilized at 243 and has been stable around 100-110.  Patient is not a diabetic.  EDP consulted hospitalist service for possible admission.     Review of Systems: As mentioned in the history of present illness. All other systems reviewed and are negative. Past Medical History:  Diagnosis Date   Arthritis    COVID-19    GERD (gastroesophageal reflux disease)    Hyperlipemia    Hypertension    Pneumonia 1995   Past Surgical History:  Procedure Laterality Date   ANTERIOR CERVICAL DECOMP/DISCECTOMY FUSION     C3-C4   BACK SURGERY     02.2011- HE HAD SOME NECK PROBLEMS WITH C7 .   CARDIAC CATHETERIZATION N/A 12/04/2014   Procedure: Left Heart Cath and Coronary Angiography;  Surgeon: Millicent Ally, MD;  Location: [facility];  Service: Cardiovascular;  Laterality: N/A;   CARPAL TUNNEL RELEASE     both arms  CHOLECYSTECTOMY     COLON SURGERY     COLONOSCOPY  02/16/2012   Procedure: COLONOSCOPY;  Surgeon: Ruby Corporal, MD;  Location: [facility];  Service: Endoscopy;  Laterality: N/A;  730   ESOPHAGOGASTRODUODENOSCOPY N/A 12/14/2013   Procedure: ESOPHAGOGASTRODUODENOSCOPY (EGD);  Surgeon: Ruby Corporal, MD;  Location: [facility];  Service: Endoscopy;  Laterality: N/A;  1030   ESOPHAGOGASTRODUODENOSCOPY (EGD) WITH ESOPHAGEAL DILATION  01/07/2012   Procedure: ESOPHAGOGASTRODUODENOSCOPY (EGD) WITH ESOPHAGEAL DILATION;  Surgeon: Ruby Corporal, MD;  Location: [facility];  Service:  Endoscopy;  Laterality: N/A;  145   MALONEY DILATION N/A 12/14/2013   Procedure: Londa Rival DILATION;  Surgeon: Ruby Corporal, MD;  Location: [facility];  Service: Endoscopy;  Laterality: N/A;   TONSILLECTOMY     Social History:  reports that he quit smoking about 16 years ago. His smoking use included cigarettes. He started smoking about 61 years ago. He has a 90 pack-year smoking history. He has never used smokeless tobacco. He reports current alcohol use. He reports that he does not use drugs.  Allergies  Allergen Reactions   Imdur  [Isosorbide  Nitrate] Nausea Only    Headache also    Isosorbide  Nausea And Vomiting and Nausea Only    Pt reports nausea and terrible headache    Headache also     Pt reports nausea and terrible headache Pt reports nausea and terrible headache Headache also  Headache also  Pt reports nausea and terrible headache  Pt reports nausea and terrible headache  Pt reports nausea and terrible headache  Headache also   Headache also   Pt reports nausea and terrible headache   Metoprolol Other (See Comments)    Severe joint weakness and felt drained   Fentanyl  Nausea And Vomiting    Family History  Problem Relation Age of Onset   Heart attack Father     Prior to Admission medications   Medication Sig Start Date End Date Taking? Authorizing Provider  CLINPRO 5000 1.1 % PSTE Place onto teeth 2 (two) times daily. 02/14/20   [provider]  DULoxetine  (CYMBALTA ) 20 MG capsule Take 40 mg by mouth at bedtime. 05/13/23   [provider]  famotidine  (PEPCID ) 40 MG tablet Take 40 mg by mouth at bedtime. 10/07/22   [provider]  feeding supplement (ENSURE ENLIVE / ENSURE PLUS) LIQD Take 237 mLs by mouth 3 (three) times daily between meals. 07/13/23   Shahmehdi, Constantino Demark, MD  HYDROcodone -acetaminophen  (NORCO/VICODIN) 5-325 MG tablet Take 1-2 tablets by mouth every 6 (six) hours as needed. 02/15/20   [provider]  ibuprofen   (ADVIL ) 800 MG tablet Take 1 tablet (800 mg total) by mouth 3 (three) times daily. 07/13/23   Shahmehdi, Constantino Demark, MD  lactulose , encephalopathy, (CHRONULAC ) 10 GM/15ML SOLN Take 20 g by mouth 3 (three) times daily. 06/12/23   [provider]  levothyroxine  (SYNTHROID ) 75 MCG tablet Take 0.5 tablets by mouth daily before breakfast. 05/08/23   [provider]  megestrol  (MEGACE ) 400 MG/10ML suspension Take 10 mLs (400 mg total) by mouth daily. 07/14/23   Shahmehdi, Constantino Demark, MD  methocarbamol  (ROBAXIN ) 500 MG tablet Take 1 tablet (500 mg total) by mouth 3 (three) times daily. 07/13/23   Bobbetta Burnet, MD  Multiple Vitamin (MULTIVITAMIN WITH MINERALS) TABS tablet Take 1 tablet by mouth daily. 07/14/23   Bobbetta Burnet, MD  Multiple Vitamin (MULTIVITAMIN) LIQD Take 5 mLs by mouth daily.    [provider]  ondansetron  (ZOFRAN ) 4 MG tablet Take 4 mg by mouth every 8 (eight) hours as needed.    [provider]  polyethylene glycol powder (GLYCOLAX /MIRALAX ) 17 GM/SCOOP powder Take 17 g by mouth daily. 07/01/23   [provider]  spironolactone  (ALDACTONE ) 100 MG tablet Take 200 mg by mouth. 06/12/23   [provider]  tadalafil  (CIALIS ) 5 MG tablet Take 5 mg by mouth daily.    [provider]  testosterone  cypionate (DEPOTESTOTERONE CYPIONATE) 200 MG/ML injection Inject 0.5 mLs into the muscle every 14 (fourteen) days.  11/17/11   [provider]  torsemide  (DEMADEX ) 20 MG tablet Take 20 mg by mouth 2 (two) times daily. 06/12/23   [provider]  traZODone  (DESYREL ) 100 MG tablet Take 100 mg by mouth at bedtime as needed for sleep. 07/08/17   [provider]  zolmitriptan (ZOMIG) 5 MG tablet Take 1 tablet by mouth every 2 (two) hours as needed for migraine.  10/29/12   [provider]      Physical Exam: Vitals:   07/20/23 1900 07/20/23 1915 07/20/23 2111 07/20/23 2116  BP: (!) 134/105  123/84   Pulse: 99 89   87  Resp:      Temp:      TempSrc:      SpO2: 93% 93%  96%  Weight:      Height:       GEN:     alert, chronically ill-appearing, cooperative male in no distress    HENT:  mucus membranes moist, no nasal discharge  EYES:   pupils equal and reactive,  EOM intact, no nystagmus NECK:  supple, Aspen c-collar present RESP:  clear to auscultation bilaterally, no increased work of breathing on room air CVS:   regular rate and rhythm, distal pulses intact   ABD:  soft, mild generalized tenderness, several healed surgical scars EXT:   Non-tender, baseline ROM of upper and lower extremities, atraumatic, no edema appreciated NEURO:  speech normal, alert and oriented  Skin:   warm and dry Psych: Normal affect, appropriate speech and behavior    Data Reviewed: Relevant notes from primary care and specialist visits, past discharge summaries as available in EHR, including Care Everywhere. Prior diagnostic testing as pertinent to current admission diagnoses Updated medications and problem lists for reconciliation ED course, including vitals, labs, imaging, treatment and response to treatment Triage notes, nursing and pharmacy notes and ED provider's notes Notable results as noted in HPI   Assessment and Plan: Principal Problem:   Hypoglycemia Active Problems:   FTT (failure to thrive) in adult   Rib fractures   Cervical spine fracture (HCC)   Hypertension   Hypothyroidism   Debility   NASH (nonalcoholic steatohepatitis)   Assessment and Plan:   Hypoglycemia  CBG has rebounded from 36 after D50 and maintaining between 100-110.  - CBG Q3H - D50 as needed, RN to notify if under <80  - if drops <80 overnight; start D5  Subarachnoid hemorrhages Pt has multiple foci of subarachnoid hemorrhage most pronounced in the left sylvian fissure, small focus of extra-axial hemorrhage in the basal cisterns, possible left subinsular region contusion versus infarct. Due to these findings EDP  reached out to neurosurgery, Dr. Ellery Guthrie, who reported that patient would be outside of the observation time window and did not recommend any further imaging as patient has passed the observation time. - Neurosurgery consulted by EDP - Monitor for neurological changes  Type III dens cervical spine fracture  CT C-spine with known type III dens fracture with increased listhesis at C4-5 and MR was recommended however per EDP neurosurgery does not recommend MRI C-spine. - Continue Aspen-collar - Follow-up neurosurgery outpatient - Monitor for neurological findings  Multiple rib fractures Status post falls left 7th and 9th ribs laterally displaced per previous chest x-ray - Norco for pain control - Consider adding ibuprofen   Nonalcoholic steatohepatitis (NASH) Advanced with liver cirrhosis and ascites: No no significant abdominal distention or ascites.  Level was normal. - Continue home lactulose  - Avoid hepatotoxins  - Monitor for neurological changes - Hepatic function pain, CBC and BMP    Essential hypertension Stable -Continue Home medications  Failure to Thrive Adult  - Continue Megace  - Consider registered dietitian consult - AM labs   Recurrent falls Patient with significant debility with recent falls on 07/16/2021 and 06/26/23  resulting in dens fracture and multiple rib fractures. - PT/OT eval and treat  Goals of care Patient is full code with serious comorbidities.  Wife was at the bedside for this conversation. - Consider palliative care consult  Advance Care Planning:   Code Status: Full Code  Consults: Neurosurgery   Family Communication: Wife at bedside  Severity of Illness: The appropriate patient status for this patient is OBSERVATION. Observation status is judged to be reasonable and necessary in order to provide the required intensity of service to ensure the patient's safety. The patient's presenting symptoms, physical exam findings, and initial radiographic  and laboratory data in the context of their medical condition is felt to place them at decreased risk for further clinical deterioration. Furthermore, it is anticipated that the patient will be medically stable for discharge from the hospital within 2 midnights of admission.    Author: Fidel Huddle, DO 07/20/2023 9:42 PM  For on call review [email or web address].

## 2023-07-20 NOTE — ED Notes (Signed)
 Pt repositioned in bed.

## 2023-07-20 NOTE — ED Triage Notes (Addendum)
 Pt from home BIB in [facility]. Pt's wife called EMS because he started throwing up and dizzy.  Wife stated per EMS pt had fallen last Friday and does not remember falling. Pt does not recall falling or if he hit his head.   Pt is A&Ox4, difficultly remembering recent events. Breathing WNL. Stated, "I have been dizzy and had a headache for about a week. When I sit up I get really dizzy." EMS stated once in truck dizziness subsided. Denies dizziness, headahce, N/V at this time. Pt continues to walk with cane after fall last week. Ted hose present upon arrival on upper and lower (removed) extremities.  welling seen bilaterally in lower extremities. EMS: CBG 106, 151/68, HR 50-70, and O2 90-94% on RA. Saturation currently 95% on RA.   C-collar is present applied stated, "I think two weeks ago.". Full ROM seen in upper and lower extremities.

## 2023-07-21 DIAGNOSIS — Z7189 Other specified counseling: Secondary | ICD-10-CM

## 2023-07-21 DIAGNOSIS — E162 Hypoglycemia, unspecified: Secondary | ICD-10-CM

## 2023-07-21 DIAGNOSIS — S12000K Unspecified displaced fracture of first cervical vertebra, subsequent encounter for fracture with nonunion: Secondary | ICD-10-CM

## 2023-07-21 DIAGNOSIS — K7581 Nonalcoholic steatohepatitis (NASH): Secondary | ICD-10-CM

## 2023-07-21 DIAGNOSIS — I609 Nontraumatic subarachnoid hemorrhage, unspecified: Secondary | ICD-10-CM | POA: Diagnosis not present

## 2023-07-21 DIAGNOSIS — R5381 Other malaise: Secondary | ICD-10-CM | POA: Diagnosis not present

## 2023-07-21 DIAGNOSIS — S066XAA Traumatic subarachnoid hemorrhage with loss of consciousness status unknown, initial encounter: Secondary | ICD-10-CM | POA: Diagnosis not present

## 2023-07-21 DIAGNOSIS — R627 Adult failure to thrive: Secondary | ICD-10-CM | POA: Diagnosis not present

## 2023-07-21 DIAGNOSIS — Z515 Encounter for palliative care: Secondary | ICD-10-CM

## 2023-07-21 DIAGNOSIS — S1234XA Type III traumatic spondylolisthesis of fourth cervical vertebra, initial encounter for closed fracture: Secondary | ICD-10-CM | POA: Diagnosis not present

## 2023-07-21 LAB — CBC WITH DIFFERENTIAL/PLATELET
Abs Immature Granulocytes: 0.04 10*3/uL (ref 0.00–0.07)
Basophils Absolute: 0 10*3/uL (ref 0.0–0.1)
Basophils Relative: 0 %
Eosinophils Absolute: 0.1 10*3/uL (ref 0.0–0.5)
Eosinophils Relative: 1 %
HCT: 28.7 % — ABNORMAL LOW (ref 39.0–52.0)
Hemoglobin: 9.5 g/dL — ABNORMAL LOW (ref 13.0–17.0)
Immature Granulocytes: 1 %
Lymphocytes Relative: 21 %
Lymphs Abs: 1.6 10*3/uL (ref 0.7–4.0)
MCH: 35.2 pg — ABNORMAL HIGH (ref 26.0–34.0)
MCHC: 33.1 g/dL (ref 30.0–36.0)
MCV: 106.3 fL — ABNORMAL HIGH (ref 80.0–100.0)
Monocytes Absolute: 0.7 10*3/uL (ref 0.1–1.0)
Monocytes Relative: 9 %
Neutro Abs: 5.2 10*3/uL (ref 1.7–7.7)
Neutrophils Relative %: 68 %
Platelets: 219 10*3/uL (ref 150–400)
RBC: 2.7 MIL/uL — ABNORMAL LOW (ref 4.22–5.81)
RDW: 19.9 % — ABNORMAL HIGH (ref 11.5–15.5)
WBC: 7.6 10*3/uL (ref 4.0–10.5)
nRBC: 0 % (ref 0.0–0.2)

## 2023-07-21 LAB — BASIC METABOLIC PANEL WITH GFR
Anion gap: 3 — ABNORMAL LOW (ref 5–15)
BUN: 12 mg/dL (ref 8–23)
CO2: 22 mmol/L (ref 22–32)
Calcium: 7.3 mg/dL — ABNORMAL LOW (ref 8.9–10.3)
Chloride: 112 mmol/L — ABNORMAL HIGH (ref 98–111)
Creatinine, Ser: 0.9 mg/dL (ref 0.61–1.24)
GFR, Estimated: >60 mL/min (ref >60)
Glucose, Bld: 161 mg/dL — ABNORMAL HIGH (ref 70–99)
Potassium: 3.3 mmol/L — ABNORMAL LOW (ref 3.5–5.1)
Sodium: 137 mmol/L (ref 135–145)

## 2023-07-21 LAB — HEPATIC FUNCTION PANEL
ALT: 20 U/L (ref 0–44)
AST: 27 U/L (ref 15–41)
Albumin: 2.1 g/dL — ABNORMAL LOW (ref 3.5–5.0)
Alkaline Phosphatase: 103 U/L (ref 38–126)
Bilirubin, Direct: 0.2 mg/dL (ref 0.0–0.2)
Indirect Bilirubin: 0.5 mg/dL (ref 0.3–0.9)
Total Bilirubin: 0.7 mg/dL (ref 0.0–1.2)
Total Protein: 4.7 g/dL — ABNORMAL LOW (ref 6.5–8.1)

## 2023-07-21 LAB — FOLATE: Folate: 9.2 ng/mL (ref >5.9)

## 2023-07-21 LAB — GLUCOSE, CAPILLARY
Glucose-Capillary: 158 mg/dL — ABNORMAL HIGH (ref 70–99)
Glucose-Capillary: 125 mg/dL — ABNORMAL HIGH (ref 70–99)
Glucose-Capillary: 144 mg/dL — ABNORMAL HIGH (ref 70–99)
Glucose-Capillary: 136 mg/dL — ABNORMAL HIGH (ref 70–99)

## 2023-07-21 LAB — HEMOGLOBIN A1C
Hgb A1c MFr Bld: 3.9 % — ABNORMAL LOW (ref 4.8–5.6)
Mean Plasma Glucose: 65.23 mg/dL

## 2023-07-21 LAB — TSH: TSH: 14.468 u[IU]/mL — ABNORMAL HIGH (ref 0.350–4.500)

## 2023-07-21 LAB — MAGNESIUM: Magnesium: 2 mg/dL (ref 1.7–2.4)

## 2023-07-21 LAB — VITAMIN B12: Vitamin B-12: 874 pg/mL (ref 180–914)

## 2023-07-21 LAB — FERRITIN: Ferritin: 249 ng/mL (ref 24–336)

## 2023-07-21 LAB — CK: Total CK: 40 U/L — ABNORMAL LOW (ref 49–397)

## 2023-07-21 LAB — IRON AND TIBC: Iron: 38 ug/dL — ABNORMAL LOW (ref 45–182)

## 2023-07-21 LAB — PHOSPHORUS: Phosphorus: 2.7 mg/dL (ref 2.5–4.6)

## 2023-07-21 LAB — T4, FREE: Free T4: 0.85 ng/dL (ref 0.61–1.12)

## 2023-07-21 LAB — CORTISOL-AM, BLOOD: Cortisol - AM: 18.5 ug/dL (ref 6.7–22.6)

## 2023-07-21 LAB — MRSA NEXT GEN BY PCR, NASAL: MRSA by PCR Next Gen: NOT DETECTED

## 2023-07-21 MED ORDER — CEFADROXIL 500 MG PO CAPS
1000.0000 mg | ORAL_CAPSULE | Freq: Two times a day (BID) | ORAL | Status: DC
Start: 2023-07-21 — End: 2023-07-21
  Administered 2023-07-21: 1000 mg via ORAL
  Filled 2023-07-21 (×3): qty 2

## 2023-07-21 MED ORDER — LEVOTHYROXINE SODIUM 100 MCG PO TABS: 100.0000 ug | ORAL_TABLET | Freq: Every day | ORAL | 1 refills | Status: AC

## 2023-07-21 MED ORDER — POTASSIUM CHLORIDE CRYS ER 20 MEQ PO TBCR
40.0000 meq | EXTENDED_RELEASE_TABLET | Freq: Once | ORAL | Status: AC
  Administered 2023-07-21: 40 meq via ORAL
  Filled 2023-07-21: qty 2

## 2023-07-21 MED ORDER — LEVOTHYROXINE SODIUM 100 MCG PO TABS: 100.0000 ug | ORAL_TABLET | Freq: Every day | ORAL | Status: DC

## 2023-07-21 MED ORDER — MIDODRINE HCL 5 MG PO TABS: 5.0000 mg | ORAL_TABLET | Freq: Three times a day (TID) | ORAL | 1 refills | Status: AC

## 2023-07-21 MED ORDER — MIDODRINE HCL 5 MG PO TABS
5.0000 mg | ORAL_TABLET | Freq: Three times a day (TID) | ORAL | Status: DC
  Administered 2023-07-21 (×2): 5 mg via ORAL
  Filled 2023-07-21 (×2): qty 1

## 2023-07-21 MED ORDER — ALBUMIN HUMAN 25 % IV SOLN
50.0000 g | Freq: Once | INTRAVENOUS | Status: AC
  Administered 2023-07-21: 50 g via INTRAVENOUS
  Filled 2023-07-21: qty 200

## 2023-07-21 MED ORDER — CEFADROXIL 500 MG PO CAPS: 1000.0000 mg | ORAL_CAPSULE | Freq: Two times a day (BID) | ORAL | 0 refills | Status: AC

## 2023-07-21 NOTE — Plan of Care (Signed)
  Problem: Acute Rehab PT Goals(only PT should resolve) Goal: Pt Will Go Supine/Side To Sit Outcome: Progressing Flowsheets (Taken 07/21/2023 1431) Pt will go Supine/Side to Sit:  with supervision  with contact guard assist Goal: Patient Will Transfer Sit To/From Stand Outcome: Progressing Flowsheets (Taken 07/21/2023 1431) Patient will transfer sit to/from stand:  with supervision  with contact guard assist Goal: Pt Will Transfer Bed To Chair/Chair To Bed Outcome: Progressing Flowsheets (Taken 07/21/2023 1431) Pt will Transfer Bed to Chair/Chair to Bed:  with supervision  with contact guard assist Goal: Pt Will Ambulate Outcome: Progressing Flowsheets (Taken 07/21/2023 1431) Pt will Ambulate:  50 feet  with supervision  with contact guard assist  with rolling walker   2:32 PM, 07/21/23 Walton Guppy, MPT Physical Therapist with [facility] [phone] office [phone] mobile phone

## 2023-07-21 NOTE — Consult Note (Signed)
 Consultation Note Date: 07/21/2023   Patient Name: Antonio Lucas  DOB: 1955/10/24  MRN: [id number]  Age / Sex: 68 y.o., male  PCP: Willma Hartmann, PA-C Referring Physician: Demaris Fillers, MD  Reason for Consultation:  goals of care   HPI/Patient Profile: 68 y.o. male  with past medical history of NASH with ascites, hypothyroid, DM2, recent admission 5/9-5/13 with failure to thrive, hypoalbuminemia, NASH, roux-en-Y surgery, severe hiatal hernia s/p repair January 2025 admitted on 07/20/2023 with fall resulting in subarachnoid hemorrhage. Palliative medicine consulted for goals of care.    Primary Decision Maker PATIENT - spouse would be surrogate  Discussion: Chart reviewed including labs, progress notes, imaging from this and previous encounters.  Reviewed attending note- neurosurgery was consulted but no interventions recommended. Patient being treated for UTI and neuro observation.  GI note from [facility] reviewed- last seen there April 11 for his NASH cirrhosis- had paracentesis at that time to rule out SBP and he was started on lactulose .  Labs reviewed- hypoalbuminemia persists at 2.1, using admission labs and PT/INR from [facility] in April- calculated MELD to be 9- indicating 1.9% 90 day mortality. However, this is increased due to Khaliq's comorbidities.  Met with Antonio Lucas. He was awake, alert, oriented. Able to participate in goals of care discussion.  He met previously with Palliative provide Arla Lab, NP during his last admission on 5/12. At that time his goals were to continue ongoing aggressive medical interventions. He has also been seen outpatient by [facility].  Thaddeus shared his healthcare journey with me. It has been a difficult road filled with complications that sound as though were initially triggered by his roux-en-Y surgery and have been complicated by his esophageal surgery and NASH  cirrhosis diagnosis. He has had a great amount of lower leg lymphedema contributing to his debility. We discussed this likely occurs due to his low albumin  from combination of malnutrition and hepatic dysfunction.  He lives at home with his spouse. He has a lot of DME that enables him to function. He ambulates minimally. He is able to sit in a shower and bathe himself. His spouse assists him at home- he acknowledges it has been difficult for her.  Mandell discussed the healthcare journeys he has witness of his son who had heart complications and died young, his Dad who died when Brylin was young, and his Mom who Delta cared for in his home until her end of life.  Marv is very committed to his faith. He has always been active in his church community and his church community has provided a great deal of support physically and spiritually for him.  Tomoki finds joy in his beliefs that his life is predestined and in the control of God. His feelings about afterlife are that he will die when it is time and there is nothing man can do to change that. These beliefs support his feelings that he would want resuscitation if he were at end of life- as he feels that he will die if it is  his time, however, if it isn't his time then God will allow him to be resuscitated. He does not feel he is at end of life.  His goals are to return home. He has been working with a physical therapist in [place]- DISH Duffy who has been inspirational to him. He goals are to continue to work with Jessee Mormon and gain return of his physical strength.     SUMMARY OF RECOMMENDATIONS -Debilitated patient in setting of malnutrition s/p roux-en-Y, hiatal hernia repair, NASH cirrhosis- patient desires continue full scope, full code -[place] with [facility] outpatient [facility]iative -Followup with physical therapy    Code Status/Advance Care Planning:   Code Status: Full Code    Prognosis:   Unable to determine  Discharge Planning: Home with  Palliative Services  Primary Diagnoses: Present on Admission:  Hypoglycemia  Rib fractures  NASH (nonalcoholic steatohepatitis)  Hypothyroidism  Hypertension  FTT (failure to thrive) in adult  Debility  Cervical spine fracture (HCC)   Review of Systems  Physical Exam  Vital Signs: BP (!) 147/90   Pulse 86   Temp 97.6 F (36.4 C) (Oral)   Resp 19   Ht 5\' 7"  (1.702 m)   Wt 77.1 kg   SpO2 97%   BMI 26.63 kg/m  Pain Scale: 0-10   Pain Score: 8    SpO2: SpO2: 97 % O2 Device:SpO2: 97 % O2 Flow Rate: .   IO: Intake/output summary:  Intake/Output Summary (Last 24 hours) at 07/21/2023 1207 Last data filed at 07/21/2023 0500 Gross per 24 hour  Intake --  Output 1150 ml  Net -1150 ml    LBM:   Baseline Weight: Weight: 77.1 kg Most recent weight: Weight: 77.1 kg       Thank you for this consult. Palliative medicine will continue to follow and assist as needed.  Time Total: 90 minutes Signed by: Micki Alas, AGNP-C Palliative Medicine  Time includes:   Preparing to see the patient (e.g., review of tests) Obtaining and/or reviewing separately obtained history Performing a medically necessary appropriate examination and/or evaluation Counseling and educating the patient/family/caregiver Ordering medications, tests, or procedures Referring and communicating with other health care professionals (when not reported separately) Documenting clinical information in the electronic or other health record Independently interpreting results (not reported separately) and communicating results to the patient/family/caregiver Care coordination (not reported separately) Clinical documentation   Please contact Palliative Medicine Team phone at [phone] for questions and concerns.  For individual provider: See Tilford Foley

## 2023-07-21 NOTE — Plan of Care (Signed)
  Problem: Education: Goal: Knowledge of General Education information will improve Description: Including pain rating scale, medication(s)/side effects and non-pharmacologic comfort measures Outcome: Progressing   Problem: Clinical Measurements: Goal: Ability to maintain clinical measurements within normal limits will improve Outcome: Progressing Goal: Will remain free from infection Outcome: Progressing Goal: Diagnostic test results will improve Outcome: Progressing Goal: Respiratory complications will improve Outcome: Progressing Goal: Cardiovascular complication will be avoided Outcome: Progressing   Problem: Activity: Goal: Risk for activity intolerance will decrease Outcome: Progressing   Problem: Coping: Goal: Level of anxiety will decrease Outcome: Progressing   Problem: Elimination: Goal: Will not experience complications related to bowel motility Outcome: Progressing Goal: Will not experience complications related to urinary retention Outcome: Progressing   Problem: Pain Managment: Goal: General experience of comfort will improve and/or be controlled Outcome: Progressing   Problem: Safety: Goal: Ability to remain free from injury will improve Outcome: Progressing

## 2023-07-21 NOTE — Evaluation (Signed)
 Physical Therapy Evaluation Patient Details Name: Antonio Lucas MRN: [id number] DOB: 08-13-1955 Today's Date: 07/21/2023  History of Present Illness  Antonio Lucas is a 68 y.o. male with medical history significant of advanced NASH, hypertension, hypothyroidism, cervical neck fracture with Aspen collar, multiple rib fractures, generalized weakness status post falls who was recently admitted on 07/10/2023 then discharged on 07/14/2023 presents for vomiting, dizziness with movement after a fall on 07/17/2023.  Wife called EMS today as he was having vomiting and dizziness.  He was having some difficulty recalling events.     Patient still has a slight headache but is much improved.  Nausea comes when he moves his head around. He took a Zofran  and likely seen prior to arrival.  No vomiting since EMS showed up to his home.  Continues to have generalized abdominal pain but this is chronic.  Denies diarrhea.  He has not been eating very well.  Has been taking the Megace  that recently started.  Wife and patient note that he did eat about an hour prior to EMSs arrival.  He uses a cane, rollator or walker at home.  Wife notes the fall on Friday occurred as he went to the bathroom and he knows that he should not have done that.  He uses a condom cath in his hospital bed.   Clinical Impression  Patient required HOB bed fully raised for sitting up at bedside, demonstrates fair/good return for completing sit to stands and limited to a few steps at bedside mostly due to c/o dizziness, found to have a significant drop in BP and unable to attempt walking in room - RN notified.  Patient tolerated sitting up in chair after therapy.Patient will benefit from continued skilled physical therapy in hospital and recommended venue below to increase strength, balance, endurance for safe ADLs and gait.       If plan is discharge home, recommend the following: A little help with bathing/dressing/bathroom;Help with stairs or ramp  for entrance;Assistance with cooking/housework;A little help with walking and/or transfers   Can travel by private vehicle   Yes    Equipment Recommendations None recommended by PT  Recommendations for Other Services       Functional Status Assessment Patient has had a recent decline in their functional status and demonstrates the ability to make significant improvements in function in a reasonable and predictable amount of time.     Precautions / Restrictions Precautions Precautions: Fall Recall of Precautions/Restrictions: Intact Required Braces or Orthoses: Cervical Brace Restrictions Weight Bearing Restrictions Per Provider Order: No      Mobility  Bed Mobility Overal bed mobility: Needs Assistance Bed Mobility: Supine to Sit     Supine to sit: Used rails, Contact guard, Supervision     General bed mobility comments: required HOB fully raised    Transfers Overall transfer level: Needs assistance Equipment used: Rolling walker (2 wheels) Transfers: Sit to/from Stand, Bed to chair/wheelchair/BSC Sit to Stand: Contact guard assist   Step pivot transfers: Contact guard assist, Min assist       General transfer comment: labored movement, poor carryover for reaching for armrest of chair during transfer    Ambulation/Gait Ambulation/Gait assistance: Min assist Gait Distance (Feet): 4 Feet Assistive device: Rolling walker (2 wheels) Gait Pattern/deviations: Decreased step length - right, Decreased step length - left, Decreased stride length Gait velocity: slow     General Gait Details: limited to a few steps at bedside mostly due to c/o dizziness and found to have  significant drop in blood presure  Stairs            Wheelchair Mobility     Tilt Bed    Modified Rankin (Stroke Patients Only)       Balance Overall balance assessment: Needs assistance Sitting-balance support: Feet supported, No upper extremity supported Sitting balance-Leahy  Scale: Fair Sitting balance - Comments: fair/good seated at EOB   Standing balance support: Bilateral upper extremity supported, During functional activity, Reliant on assistive device for balance Standing balance-Leahy Scale: Poor Standing balance comment: fair/poor using RW                             Pertinent Vitals/Pain Pain Assessment Pain Assessment: No/denies pain    Home Living Family/patient expects to be discharged to:: Private residence Living Arrangements: Spouse/significant other Available Help at Discharge: Family;Available 24 hours/day Type of Home: House Home Access: Stairs to enter Entrance Stairs-Rails: Right Entrance Stairs-Number of Steps: 3   Home Layout: One level Home Equipment: Agricultural consultant (2 wheels);Cane - single point;Shower seat;Grab bars - tub/shower;Hospital bed;BSC/3in1;Rollator (4 wheels)      Prior Function Prior Level of Function : Needs assist       Physical Assist : Mobility (physical);ADLs (physical) Mobility (physical): Bed mobility;Transfers;Gait;Stairs ADLs (physical): Bathing;Dressing;IADLs Mobility Comments: household using RW mostly; was using cane previously. ADLs Comments: Assited for bathing, dressing, and IADL's.     Extremity/Trunk Assessment   Upper Extremity Assessment Upper Extremity Assessment: Defer to OT evaluation    Lower Extremity Assessment Lower Extremity Assessment: Generalized weakness    Cervical / Trunk Assessment Cervical / Trunk Assessment: Kyphotic Cervical / Trunk Exceptions: wearing neck brace due to odontoid fx  Communication   Communication Communication: No apparent difficulties    Cognition Arousal: Alert Behavior During Therapy: WFL for tasks assessed/performed, Anxious   PT - Cognitive impairments: No apparent impairments                         Following commands: Intact       Cueing Cueing Techniques: Verbal cues     General Comments      Exercises      Assessment/Plan    PT Assessment Patient needs continued PT services  PT Problem List Decreased strength;Decreased activity tolerance;Decreased balance;Decreased mobility       PT Treatment Interventions DME instruction;Gait training;Stair training;Functional mobility training;Therapeutic activities;Therapeutic exercise;Balance training;Patient/family education    PT Goals (Current goals can be found in the Care Plan section)  Acute Rehab PT Goals Patient Stated Goal: return home with family to assist PT Goal Formulation: With patient Time For Goal Achievement: 07/25/23 Potential to Achieve Goals: Good    Frequency Min 3X/week     Co-evaluation PT/OT/SLP Co-Evaluation/Treatment: Yes Reason for Co-Treatment: To address functional/ADL transfers PT goals addressed during session: Mobility/safety with mobility;Balance;Proper use of DME         AM-PAC PT "6 Clicks" Mobility  Outcome Measure Help needed turning from your back to your side while in a flat bed without using bedrails?: A Little Help needed moving from lying on your back to sitting on the side of a flat bed without using bedrails?: A Little Help needed moving to and from a bed to a chair (including a wheelchair)?: A Little Help needed standing up from a chair using your arms (e.g., wheelchair or bedside chair)?: A Little Help needed to walk in hospital room?: A Lot Help  needed climbing 3-5 steps with a railing? : A Lot 6 Click Score: 16    End of Session   Activity Tolerance: Patient tolerated treatment well;Patient limited by fatigue Patient left: in chair;with call bell/phone within reach Nurse Communication: Mobility status PT Visit Diagnosis: Unsteadiness on feet (R26.81);Other abnormalities of gait and mobility (R26.89);Muscle weakness (generalized) (M62.81)    Time: 1610-9604 PT Time Calculation (min) (ACUTE ONLY): 31 min   Charges:   PT Evaluation $PT Eval Moderate Complexity: 1 Mod PT  Treatments $Therapeutic Activity: 23-37 mins PT General Charges $$ ACUTE PT VISIT: 1 Visit         2:29 PM, 07/21/23 Walton Guppy, MPT Physical Therapist with [facility] [phone] office [phone] mobile phone

## 2023-07-21 NOTE — TOC Transition Note (Signed)
 Transition of Care [facility]) - Discharge Note   Patient Details  Name: Antonio Lucas MRN: [id number] Date of Birth: 06/10/1955  [facility] of [facility]) CM/SW Contact:  Orelia Binet, RN Phone Number: 07/21/2023, 11:21 AM   Clinical Narrative:   CM at the bedside to discuss PT eval. Patient states, he is active with [facility] and he will let them set up the services that he needs. He has a hospital bed, and all equipment needed. Spend most of his time in bed, his wife is there to assist. He spent 4 months in rehab and wants to return home. He is doing bed exercise as much as possible. He will need EMS to transport him home. Team updated.  [facility] update with discharge plan.     Final next level of care: Home/Self Care Barriers to Discharge: Barriers Resolved   Patient Goals and CMS Choice Patient states their goals for this hospitalization and ongoing recovery are:: return home     Discharge Placement             Home      Patient and family notified of of transfer: 07/21/23  Discharge Plan and Services Additional resources added to the After Visit Summary for         Social Drivers of Health (SDOH) Interventions SDOH Screenings   Food Insecurity: No Food Insecurity (07/10/2023)  Housing: Low Risk  (07/10/2023)  Transportation Needs: No Transportation Needs (07/10/2023)  Utilities: Not At Risk (07/10/2023)  Financial Resource Strain: Low Risk  (03/27/2023)   Received from [facility]  Physical Activity: Unknown (03/27/2023)   Received from [facility]  Social Connections: Socially Integrated (07/10/2023)  Stress: Patient Declined (03/27/2023)   Received from [facility]  Tobacco Use: Medium Risk (07/20/2023)

## 2023-07-21 NOTE — Discharge Summary (Addendum)
 Physician Discharge Summary   Patient: Antonio Lucas MRN: [id number] DOB: Feb 04, 1956  Admit date:     07/20/2023  Discharge date: 07/21/23  Discharge Physician: Myrtie Atkinson Loyola Santino   PCP: Willma Hartmann, PA-C   Recommendations at discharge:   Please follow up with primary care provider within 1-2 weeks  Please repeat BMP and CBC in one week     Hospital Course: 68 year old male with a history of NAFLD cirrhosis, hypertension, failure to thrive, multiple prior spinal fusions and spinal cord stimulator (done at [facility] by Dr. Jaquita Merl), presents with nausea, vomiting, and dizziness.  The patient fell 3 days prior to this admission. He does endorse problems with memory and intermittent confusion which is not new, states he has been compliant with his lactulose  PTA, was also started on Megace  secondary to poor p.o. intake with his recent hospitalization. He is barely ambulatory at home using a walker or a cane, but spends a lot of time laying in his new hospital bed.  Patient states that he was repositioning himself on the side of his hospital bed to use the urinal.  He was so weak he was not able to get himself back into the bed. He endorses poor oral intake for the past 2 to 3 weeks.  At baseline, the patient has had poor oral intake in the setting of his previous history of Roux-en-Y bypass over 6 years ago.  He states that his poor oral intake has been significantly worsened since his falling resulting in pain from his cervical spine fractures and rib fractures. He endorses intermittent nausea and vomiting which is incited by his dizziness.  He states that his dizziness is worse with movement of his head and neck.  He has tried meclizine  without much relief.  He denies any worsening headache, hematemesis, chest pain, shortness breath, diarrhea, hematochezia, melena. Dr.Elsner. discussed pt's hx, cirrhosis, frequent falls and todays CT findings - he also reviewed films. Given fall occurred 3  days ago, no indication for further imaging or neurosurgical intervention as he has also passed observation time. Also reviewed CT c spine films and does not feel pt needs MR imaging of c spine.  In the ED, the patient was afebrile hemodynamically stable with oxygen saturation 99% room air.  WBC 5.9, hemoglobin 9.1, platelet 209.  Sodium 137, potassium 4.1, bicarbonate 23, serum creatinine 1.00.  LFTs unremarkable.  COVID-19 PCR is negative.  UA 21-50 WBC. In the ED, the patient had a CBG of 67.  He was given D50.  Recheck was 36.   Notably, the patient had a hospital admission at [facility] from 06/26/2023 to 07/01/2023 after he sustained injuries from a series of ground-level falls.  It was noted that the patient has had issues with poor nutrition and generalized weakness.  The patient was found to have displaced type II odontoid fracture of C2 with dorsal angulation.  There is age-indeterminate fracture at C4-5, there was poor surgical changes C3-C7 ACDF.  He was managed postoperatively.  He was discharged with instructions to follow-up with spinal surgery at [facility]. He also sustained a nondisplaced fracture of the right anterior fourth rib and right fifth rib through eighth ribs.  He was noted to have a patulous fluid-filled esophagus placing him at high risk for aspiration.  The patient had progressive weakness at home with difficulty ambulating and sustained another fall This resulted in an additional hospital admission from 07/10/2023 to 07/14/2023 at [facility].  He was treated for failure to  thrive and started on Megace .  He was noted to have new left 7th through 9th rib fractures.  The patient and family declined SNF.  The patient was discharged home with home health and palliative care.  Assessment and Plan: Subarachnoid hemorrhage -CT brain--scattered subarachnoid hemorrhage most prominent in the left sylvian fissure.  Additional small foci of extra axial hemorrhage in the basilar cisterns -Due  to these findings EDP reached out to neurosurgery, Dr. Ellery Guthrie, who reported that patient would be outside of the observation time window and did not recommend any further imaging as patient has passed the observation time.  - 07/21/2023--no focal neurologic deficits, mental status at baseline   Hypoglycemia - Secondary to failure to thrive and poor oral intake in the setting of NAFLD cirrhosis - pt has hx of Roux-en-Y gastric bypass - Start nutritional supplements - Check A1c--3.9 - A.m. cortisol--pending at time of d/c - TSH--12.468 - overall improved without any further hypoglycemic reading during the admission   Failure to thrive - PT evaluation>>SNF>>pt prefers to go home - Due to using up his Medicare days, the patient would prefer to go home - Liberalize diet - B12--874 - Folate--9.2 - UA 21-50WBC   Type III dens cervical spine fracture CT C-spine with known type III dens fracture with increased listhesis at C4-5 and MR was recommended however per EDP neurosurgery does not recommend MRI C-spine. - Continue Aspen-collar   Pyuria - UA 21-50 WBC - Send urine culture - Empirically treat for UTI>>cefadroxil x 3 days   Multiple rib fractures Status post falls left 7th and 9th ribs laterally displaced per previous chest x-ray -Patient also noted to have right-sided rib fractures from previous hospitalizations - Norco for pain control   Nonalcoholic steatohepatitis (NASH) -clinically compensated -ammonia = 15 - Continue home lactulose    Labile blood pressures/Orthostatic hypotension - Reviewed the medical record shows in general the patient has had soft blood pressures>>> secondary to his liver cirrhosis - Holding diuretics temporarily -start midodrine -given albumin  50 grams during hospitalization  Hypothyroidism -pt states he takes the entire 75 mcg tablet daily -TSH 14.468 -increase synthroid  to 100 mcg daily -repeat TSH in 4 weeks         Consultants:  palliative Procedures performed: none  Disposition: Home Diet recommendation:  Regular diet DISCHARGE MEDICATION: Allergies as of 07/21/2023       Reactions   Imdur  [isosorbide  Nitrate] Nausea Only   Headache also    Isosorbide  Nausea And Vomiting, Nausea Only   Pt reports nausea and terrible headache    Headache also     Pt reports nausea and terrible headache Pt reports nausea and terrible headache Headache also  Headache also  Pt reports nausea and terrible headache Pt reports nausea and terrible headache  Pt reports nausea and terrible headache  Headache also   Headache also   Pt reports nausea and terrible headache   Metoprolol Other (See Comments)   Severe joint weakness and felt drained   Fentanyl  Nausea And Vomiting        Medication List     TAKE these medications    cefadroxil 500 MG capsule Commonly known as: DURICEF Take 2 capsules (1,000 mg total) by mouth 2 (two) times daily.   Clinpro 5000 1.1 % Pste Generic drug: Sodium Fluoride Place onto teeth 2 (two) times daily.   doxycycline  100 MG capsule Commonly known as: MONODOX  Take 100 mg by mouth 2 (two) times daily. 7 day course starting 07/16/2023  DULoxetine  20 MG capsule Commonly known as: CYMBALTA  Take 40 mg by mouth at bedtime.   famotidine  40 MG tablet Commonly known as: PEPCID  Take 40 mg by mouth at bedtime.   feeding supplement Liqd Take 237 mLs by mouth 3 (three) times daily between meals.   HYDROcodone -acetaminophen  5-325 MG tablet Commonly known as: NORCO/VICODIN Take 1-2 tablets by mouth every 6 (six) hours as needed.   lactulose  (encephalopathy) 10 GM/15ML Soln Commonly known as: CHRONULAC  Take 20 g by mouth 3 (three) times daily.   levothyroxine  100 MCG tablet Commonly known as: SYNTHROID  Take 1 tablet (100 mcg total) by mouth daily before breakfast. Start taking on: Jul 22, 2023 What changed:  medication strength how much to take   megestrol  400 MG/10ML  suspension Commonly known as: MEGACE  Take 10 mLs (400 mg total) by mouth daily.   midodrine 5 MG tablet Commonly known as: PROAMATINE Take 1 tablet (5 mg total) by mouth 3 (three) times daily with meals.   multivitamin Liqd Take 5 mLs by mouth daily.   ondansetron  4 MG tablet Commonly known as: ZOFRAN  Take 4 mg by mouth every 8 (eight) hours as needed for nausea or vomiting.   spironolactone  100 MG tablet Commonly known as: ALDACTONE  Take 200 mg by mouth daily.   tadalafil  5 MG tablet Commonly known as: CIALIS  Take 5 mg by mouth at bedtime.   testosterone  cypionate 200 MG/ML injection Commonly known as: DEPOTESTOSTERONE CYPIONATE Inject 0.5 mLs into the muscle every 14 (fourteen) days.   torsemide  20 MG tablet Commonly known as: DEMADEX  Take 20 mg by mouth 2 (two) times daily.   traZODone  100 MG tablet Commonly known as: DESYREL  Take 200 mg by mouth at bedtime.   zolmitriptan 5 MG tablet Commonly known as: ZOMIG Take 1 tablet by mouth every 2 (two) hours as needed for migraine.        Follow-up Information     [facility]. Call.   Why: They are aware of your hospitization and plan to discharge home. Contact information: Out Patient Palliative               Discharge Exam: Filed Weights   07/20/23 1342  Weight: 77.1 kg   HEENT:  [place]/AT, No thrush, no icterus CV:  RRR, no rub, no S3, no S4 Lung:  CTA, no wheeze, no rhonchi Abd:  soft/+BS, NT Ext:  No edema, no lymphangitis, no synovitis, no rash   Condition at discharge: stable  The results of significant diagnostics from this hospitalization (including imaging, microbiology, ancillary and laboratory) are listed below for reference.   Imaging Studies: CT Head Wo Contrast Result Date: 07/20/2023 CLINICAL DATA:  Head trauma, fall 3 days ago comment dizziness. EXAM: CT HEAD WITHOUT CONTRAST CT CERVICAL SPINE WITHOUT CONTRAST TECHNIQUE: Multidetector CT imaging of the head and cervical spine was  performed following the standard protocol without intravenous contrast. Multiplanar CT image reconstructions of the cervical spine were also generated. RADIATION DOSE REDUCTION: This exam was performed according to the departmental dose-optimization program which includes automated exposure control, adjustment of the mA and/or kV according to patient size and/or use of iterative reconstruction technique. COMPARISON:  CT head 05/19/2020, CT FINDINGS: CT HEAD FINDINGS Brain: There are multiple foci of subarachnoid hemorrhage most pronounced in the left sylvian fissure. Additional scattered small volume subarachnoid hemorrhage in the posterior left frontal lobe, bilateral occipital lobes and right frontal lobe. Small external focus of extra-axial hemorrhage within the right lateral aspect of the quadrigeminal cistern. No  areas of parenchymal hemorrhage identified. There is a small focus of hypoattenuation within the left subinsular region which may reflect infarct versus contusion. No significant mass effect or midline shift. The basilar cisterns are otherwise patent. There are hypoattenuating extra-axial collections over the bilateral frontal convexities measuring 9 mm in thickness on the right and 7 mm on the left. Vascular: No hyperdense vessel or unexpected calcification. Skull: Normal. Negative for fracture or focal lesion. Sinuses/Orbits: Orbits are symmetric. Mucosal thickening in the right frontal sinus and ethmoid sinuses. Additional mild mucosal thickening in the right maxillary sinus. Other: Mastoid air cells are clear. CT CERVICAL SPINE FINDINGS Alignment: Straightening of the normal cervical lordosis. There is trace anterolisthesis of C4 on C5 which appears new since the prior MRI. Similar trace anterolisthesis of C5 on C6. No facet subluxation or dislocation. Skull base and vertebrae: There is a mildly displaced fracture through the dens which extends into the right lateral mass of C2 concerning for type  3 dens fracture. There is slight angulation of the dens with maintained atlantal dens interval. Anterior cervical fusion at C3-4 and at C4-C7 with interbody spacers at multiple levels. Hardware is intact. Lucency along the anterior aspect of the C5 vertebra with well corticated margins which is likely chronic. No suspicious osseous lesion. Soft tissues and spinal canal: No prevertebral fluid or swelling. No visible canal hematoma. Disc levels: Intervertebral disc space narrowing at multiple levels. There is no high-grade osseous spinal canal stenosis. Facet arthrosis at multiple levels. Significant foraminal narrowing on the right at C3-4 and C4-5. Upper chest: Bilateral pleural effusions, left greater than right. Other: None. IMPRESSION: Scattered subarachnoid hemorrhage as described above most pronounced in the left sylvian fissure. Additional small focus of extra-axial hemorrhage in the basilar cisterns. Hypoattenuation in the left subinsular region which could reflect focus of contusion versus infarct. 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 No midline shift. Type 3 dens fracture extending into the right lateral mass of C2 with mild displacement and angulation. Increased listhesis at C4-5 which appears new since the prior MRI. Recommend MRI for further evaluation of ligamentous injury. Bilateral pleural effusions. These results were called by telephone at the time of interpretation on 07/20/2023 at 4:28 pm to provider Dr. Annabell Key, who verbally acknowledged these results. Electronically Signed   By: Denny Flack M.D.   On: 07/20/2023 16:49   CT Cervical Spine Wo Contrast Result Date: 07/20/2023 CLINICAL DATA:  Head trauma, fall 3 days ago comment dizziness. EXAM: CT HEAD WITHOUT CONTRAST CT CERVICAL SPINE WITHOUT CONTRAST TECHNIQUE: Multidetector CT imaging of the head and cervical spine was performed  following the standard protocol without intravenous contrast. Multiplanar CT image reconstructions of the cervical spine were also generated. RADIATION DOSE REDUCTION: This exam was performed according to the departmental dose-optimization program which includes automated exposure control, adjustment of the mA and/or kV according to patient size and/or use of iterative reconstruction technique. COMPARISON:  CT head 05/19/2020, CT FINDINGS: CT HEAD FINDINGS Brain: There are multiple foci of subarachnoid hemorrhage most pronounced in the left sylvian fissure. Additional scattered small volume subarachnoid hemorrhage in the posterior left frontal lobe, bilateral occipital lobes and right frontal lobe. Small external focus of extra-axial hemorrhage within the right lateral aspect of the quadrigeminal cistern. No areas of parenchymal hemorrhage identified. There is a small focus of hypoattenuation within the left subinsular region which may reflect infarct versus contusion. No significant mass effect or midline  shift. The basilar cisterns are otherwise patent. There are hypoattenuating extra-axial collections over the bilateral frontal convexities measuring 9 mm in thickness on the right and 7 mm on the left. Vascular: No hyperdense vessel or unexpected calcification. Skull: Normal. Negative for fracture or focal lesion. Sinuses/Orbits: Orbits are symmetric. Mucosal thickening in the right frontal sinus and ethmoid sinuses. Additional mild mucosal thickening in the right maxillary sinus. Other: Mastoid air cells are clear. CT CERVICAL SPINE FINDINGS Alignment: Straightening of the normal cervical lordosis. There is trace anterolisthesis of C4 on C5 which appears new since the prior MRI. Similar trace anterolisthesis of C5 on C6. No facet subluxation or dislocation. Skull base and vertebrae: There is a mildly displaced fracture through the dens which extends into the right lateral mass of C2 concerning for type 3 dens  fracture. There is slight angulation of the dens with maintained atlantal dens interval. Anterior cervical fusion at C3-4 and at C4-C7 with interbody spacers at multiple levels. Hardware is intact. Lucency along the anterior aspect of the C5 vertebra with well corticated margins which is likely chronic. No suspicious osseous lesion. Soft tissues and spinal canal: No prevertebral fluid or swelling. No visible canal hematoma. Disc levels: Intervertebral disc space narrowing at multiple levels. There is no high-grade osseous spinal canal stenosis. Facet arthrosis at multiple levels. Significant foraminal narrowing on the right at C3-4 and C4-5. Upper chest: Bilateral pleural effusions, left greater than right. Other: None. IMPRESSION: Scattered subarachnoid hemorrhage as described above most pronounced in the left sylvian fissure. Additional small focus of extra-axial hemorrhage in the basilar cisterns. Hypoattenuation in the left subinsular region which could reflect focus of contusion versus infarct. 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 No midline shift. Type 3 dens fracture extending into the right lateral mass of C2 with mild displacement and angulation. Increased listhesis at C4-5 which appears new since the prior MRI. Recommend MRI for further evaluation of ligamentous injury. Bilateral pleural effusions. These results were called by telephone at the time of interpretation on 07/20/2023 at 4:28 pm to provider Dr. Annabell Key, who verbally acknowledged these results. Electronically Signed   By: Denny Flack M.D.   On: 07/20/2023 16:49   DG Ribs Unilateral W/Chest Left Result Date: 07/10/2023 CLINICAL DATA:  Recent fall with left rib pain, initial encounter EXAM: LEFT RIBS AND CHEST - 3+ VIEW COMPARISON:  None Available. FINDINGS: Cardiac shadow is within normal limits. Spinal stimulator is seen. Scattered linear  scarring is noted in the lungs bilaterally. No pneumothorax is noted. Small left effusion is noted. Multiple left-sided rib fractures are noted involving the seventh through ninth ribs. Postsurgical changes are noted in the left upper quadrant. IMPRESSION: Mildly displaced fractures of the left seventh through ninth ribs laterally. No pneumothorax is noted. Small effusion is seen. Electronically Signed   By: Violeta Grey M.D.   On: 07/10/2023 11:29    Microbiology: Results for orders placed or performed during the hospital encounter of 07/20/23  Resp panel by RT-PCR (RSV, Flu A&B, Covid) Anterior Nasal Swab     Status: None   Collection Time: 07/20/23  3:45 PM   Specimen: Anterior Nasal Swab  Result Value Ref Range Status   SARS Coronavirus 2 by RT PCR NEGATIVE NEGATIVE Final    Comment: (NOTE) SARS-CoV-2 target nucleic acids are NOT DETECTED.  The SARS-CoV-2 RNA is generally detectable in upper respiratory specimens during the acute phase of infection. The lowest  concentration of SARS-CoV-2 viral copies this assay can detect is 138 copies/mL. A negative result does not preclude SARS-Cov-2 infection and should not be used as the sole basis for treatment or other patient management decisions. A negative result may occur with  improper specimen collection/handling, submission of specimen other than nasopharyngeal swab, presence of viral mutation(s) within the areas targeted by this assay, and inadequate number of viral copies(<138 copies/mL). A negative result must be combined with clinical observations, patient history, and epidemiological information. The expected result is Negative.  Fact Sheet for Patients:  BloggerCourse.com  Fact Sheet for Healthcare Providers:  SeriousBroker.it  This test is no t yet approved or cleared by the [place]  FDA and  has been authorized for detection and/or diagnosis of SARS-CoV-2 by FDA under an  Emergency Use Authorization (EUA). This EUA will remain  in effect (meaning this test can be used) for the duration of the COVID-19 declaration under Section 564(b)(1) of the Act, 21 U.S.C.section 360bbb-3(b)(1), unless the authorization is terminated  or revoked sooner.       Influenza A by PCR NEGATIVE NEGATIVE Final   Influenza B by PCR NEGATIVE NEGATIVE Final    Comment: (NOTE) The Xpert Xpress SARS-CoV-2/FLU/RSV plus assay is intended as an aid in the diagnosis of influenza from Nasopharyngeal swab specimens and should not be used as a sole basis for treatment. Nasal washings and aspirates are unacceptable for Xpert Xpress SARS-CoV-2/FLU/RSV testing.  Fact Sheet for Patients: BloggerCourse.com  Fact Sheet for Healthcare Providers: SeriousBroker.it  This test is not yet approved or cleared by the [place]  FDA and has been authorized for detection and/or diagnosis of SARS-CoV-2 by FDA under an Emergency Use Authorization (EUA). This EUA will remain in effect (meaning this test can be used) for the duration of the COVID-19 declaration under Section 564(b)(1) of the Act, 21 U.S.C. section 360bbb-3(b)(1), unless the authorization is terminated or revoked.     Resp Syncytial Virus by PCR NEGATIVE NEGATIVE Final    Comment: (NOTE) Fact Sheet for Patients: BloggerCourse.com  Fact Sheet for Healthcare Providers: SeriousBroker.it  This test is not yet approved or cleared by the [place]  FDA and has been authorized for detection and/or diagnosis of SARS-CoV-2 by FDA under an Emergency Use Authorization (EUA). This EUA will remain in effect (meaning this test can be used) for the duration of the COVID-19 declaration under Section 564(b)(1) of the Act, 21 U.S.C. section 360bbb-3(b)(1), unless the authorization is terminated or revoked.  Performed at [facility],  [address], [place], [place] [address]   MRSA Next Gen by PCR, Nasal     Status: None   Collection Time: 07/20/23 10:20 PM   Specimen: Nasal Mucosa; Nasal Swab  Result Value Ref Range Status   MRSA by PCR Next Gen NOT DETECTED NOT DETECTED Final    Comment: (NOTE) The GeneXpert MRSA Assay (FDA approved for NASAL specimens only), is one component of a comprehensive MRSA colonization surveillance program. It is not intended to diagnose MRSA infection nor to guide or monitor treatment for MRSA infections. Test performance is not FDA approved in patients less than 50 years old. Performed at [facility], [address] [place], [place] [address]     Labs: CBC: Recent Labs  Lab 07/20/23 1536 07/21/23 0428  WBC 5.9 7.6  NEUTROABS 3.9 5.2  HGB 9.1* 9.5*  HCT 27.3* 28.7*  MCV 107.5* 106.3*  PLT 209 219   Basic Metabolic Panel: Recent Labs  Lab 07/20/23 1536 07/21/23  0428  NA 137 137  K 4.1 3.3*  CL 111 112*  CO2 23 22  GLUCOSE 203* 161*  BUN 14 12  CREATININE 1.00 0.90  CALCIUM  7.8* 7.3*  MG  --  2.0  PHOS  --  2.7   Liver Function Tests: Recent Labs  Lab 07/20/23 1536 07/21/23 0428  AST 25 27  ALT 20 20  ALKPHOS 102 103  BILITOT 0.8 0.7  PROT 4.7* 4.7*  ALBUMIN  2.1* 2.1*   CBG: Recent Labs  Lab 07/20/23 1936 07/20/23 2228 07/21/23 0621 07/21/23 0742 07/21/23 1150  GLUCAP 110* 107* 158* 125* 144*    Discharge time spent: greater than 30 minutes.  Signed: Demaris Fillers, MD [facility] 07/21/2023

## 2023-07-21 NOTE — Plan of Care (Signed)
  Problem: Acute Rehab OT Goals (only OT should resolve) Goal: Pt. Will Perform Grooming Flowsheets (Taken 07/21/2023 [phone]) Pt Will Perform Grooming: with modified independence Goal: Pt. Will Perform Upper Body Dressing Flowsheets (Taken 07/21/2023 [phone]) Pt Will Perform Upper Body Dressing: with modified independence Goal: Pt. Will Perform Lower Body Dressing Flowsheets (Taken 07/21/2023 [phone]) Pt Will Perform Lower Body Dressing: with modified independence Goal: Pt. Will Transfer To Toilet Flowsheets (Taken 07/21/2023 [phone]) Pt Will Transfer to Toilet: with modified independence Goal: Pt. Will Perform Toileting-Clothing Manipulation Flowsheets (Taken 07/21/2023 [phone]) Pt Will Perform Toileting - Clothing Manipulation and hygiene: with modified independence Goal: Pt/Caregiver Will Perform Home Exercise Program Flowsheets (Taken 07/21/2023 [phone]) Pt/caregiver will Perform Home Exercise Program:  Increased strength  Increased ROM  Both right and left upper extremity  Independently  Rhylee Pucillo OT, MOT

## 2023-07-21 NOTE — Evaluation (Signed)
 Occupational Therapy Evaluation Patient Details Name: Antonio Lucas MRN: [id number] DOB: [birth year] Today's Date: 07/21/2023   History of Present Illness   Antionio R Lucas is a 68 y.o. male with medical history significant of advanced NASH, hypertension, hypothyroidism, cervical neck fracture with Aspen collar, multiple rib fractures, generalized weakness status post falls who was recently admitted on 07/10/2023 then discharged on 07/14/2023 presents for vomiting, dizziness with movement after a fall on 07/17/2023.  Wife called EMS today as he was having vomiting and dizziness.  He was having some difficulty recalling events.     Patient still has a slight headache but is much improved.  Nausea comes when he moves his head around. He took a Zofran  and likely seen prior to arrival.  No vomiting since EMS showed up to his home.  Continues to have generalized abdominal pain but this is chronic.  Denies diarrhea.  He has not been eating very well.  Has been taking the Megace  that recently started.  Wife and patient note that he did eat about an hour prior to EMSs arrival.  He uses a cane, rollator or walker at home.  Wife notes the fall on Friday occurred as he went to the bathroom and he knows that he should not have done that.  He uses a condom cath in his hospital bed. (per DO)     Clinical Impressions Pt agreeable to OT and PT co-evaluation. Pt required supervision to CGA assist with HOB elevated for bed mobility. CGA to min A for step pivot transfer to chair. Blood pressures taken during session 87/64 seated in chair, 52/30 when standing, and 76/57 mmHg when sitting again. Nurse notified. Pt reports his wife can provide the assist needed at home. Much assist for lower body ADL's and some for upper body due to B UE weakness. Pt left in the chair with call bell within reach. Pt will benefit from continued OT in the hospital and recommended venue below to increase strength, balance, and endurance for safe  ADL's.        If plan is discharge home, recommend the following:   A little help with walking and/or transfers;A lot of help with bathing/dressing/bathroom;Assistance with cooking/housework;Assist for transportation;Help with stairs or ramp for entrance     Functional Status Assessment   Patient has had a recent decline in their functional status and demonstrates the ability to make significant improvements in function in a reasonable and predictable amount of time.     Equipment Recommendations   None recommended by OT             Precautions/Restrictions   Precautions Precautions: Fall Recall of Precautions/Restrictions: Intact Required Braces or Orthoses: Cervical Brace Restrictions Weight Bearing Restrictions Per Provider Order: No     Mobility Bed Mobility Overal bed mobility: Needs Assistance Bed Mobility: Supine to Sit     Supine to sit: Used rails, Contact guard, Supervision     General bed mobility comments: labored movement; HOB raided to near 90*    Transfers Overall transfer level: Needs assistance Equipment used: Rolling walker (2 wheels) Transfers: Sit to/from Stand, Bed to chair/wheelchair/BSC Sit to Stand: Contact guard assist     Step pivot transfers: Contact guard assist, Min assist     General transfer comment: labored movement; uncontrolled flopping into chair; unsteady      Balance Overall balance assessment: Needs assistance Sitting-balance support: Feet supported, Bilateral upper extremity supported Sitting balance-Leahy Scale: Fair Sitting balance - Comments: seated at EOB  Standing balance support: Bilateral upper extremity supported, During functional activity, Reliant on assistive device for balance Standing balance-Leahy Scale: Poor Standing balance comment: poor to fair with RW                           ADL either performed or assessed with clinical judgement   ADL Overall ADL's : Needs  assistance/impaired     Grooming: Minimal assistance;Sitting;Moderate assistance   Upper Body Bathing: Minimal assistance;Sitting   Lower Body Bathing: Maximal assistance;Total assistance;Sitting/lateral leans   Upper Body Dressing : Minimal assistance;Sitting   Lower Body Dressing: Maximal assistance;Total assistance;Sitting/lateral leans   Toilet Transfer: Minimal assistance;Stand-pivot;Rolling walker (2 wheels) Toilet Transfer Details (indicate cue type and reason): Simulated via EOB to chair with RW Toileting- Clothing Manipulation and Hygiene: Moderate assistance;Sitting/lateral lean;Sit to/from stand;Maximal assistance               Vision Baseline Vision/History: 1 Wears glasses Ability to See in Adequate Light: 1 Impaired Patient Visual Report: No change from baseline Vision Assessment?: No apparent visual deficits     Perception Perception: Not tested       Praxis Praxis: Not tested       Pertinent Vitals/Pain Pain Assessment Pain Assessment: No/denies pain     Extremity/Trunk Assessment Upper Extremity Assessment Upper Extremity Assessment: Generalized weakness (2+/5 bilateral shoulder flexion; generally weak otherwise.)   Lower Extremity Assessment Lower Extremity Assessment: Defer to PT evaluation   Cervical / Trunk Assessment Cervical / Trunk Assessment: Kyphotic   Communication Communication Communication: No apparent difficulties   Cognition Arousal: Alert Behavior During Therapy: WFL for tasks assessed/performed, Anxious Cognition: No apparent impairments                               Following commands: Intact       Cueing  General Comments   Cueing Techniques: Verbal cues                 Home Living Family/patient expects to be discharged to:: Private residence Living Arrangements: Spouse/significant other Available Help at Discharge: Family;Available 24 hours/day Type of Home: House Home Access: Stairs to  enter [employer] of Steps: 3 Entrance Stairs-Rails: Right Home Layout: One level     Bathroom Shower/Tub: Producer, television/film/video: Handicapped height Bathroom Accessibility: Yes   Home Equipment: Agricultural consultant (2 wheels);Cane - single point;Shower seat;Grab bars - tub/shower;Hospital bed;BSC/3in1;Rollator (4 wheels) (Pt uses leg grabber for donning lower body clothing.)          Prior Functioning/Environment Prior Level of Function : Needs assist       Physical Assist : Mobility (physical);ADLs (physical) Mobility (physical): Bed mobility;Transfers;Gait;Stairs ADLs (physical): Bathing;Dressing;IADLs Mobility Comments: household using RW mostly; was using cane previously. ADLs Comments: Assited for bathing, dressing, and IADL's.    OT Problem List: Decreased strength;Decreased range of motion;Decreased activity tolerance;Impaired balance (sitting and/or standing)   OT Treatment/Interventions: Self-care/ADL training;Therapeutic exercise;Therapeutic activities;Balance training;Patient/family education;Energy conservation      OT Goals(Current goals can be found in the care plan section)   Acute Rehab OT Goals Patient Stated Goal: return home OT Goal Formulation: With patient Time For Goal Achievement: 08/04/23 Potential to Achieve Goals: Good   OT Frequency:  Min 2X/week    Co-evaluation PT/OT/SLP Co-Evaluation/Treatment: Yes Reason for Co-Treatment: To address functional/ADL transfers   OT goals addressed during session: ADL's and self-care  AM-PAC OT "6 Clicks" Daily Activity     Outcome Measure Help from another person eating meals?: None Help from another person taking care of personal grooming?: A Little Help from another person toileting, which includes using toliet, bedpan, or urinal?: A Lot Help from another person bathing (including washing, rinsing, drying)?: A Lot Help from another person to put on and taking off regular  upper body clothing?: A Little Help from another person to put on and taking off regular lower body clothing?: A Lot 6 Click Score: 16   End of Session Equipment Utilized During Treatment: Rolling walker (2 wheels);Gait belt Nurse Communication: Other (comment) (notified of pt's low blood pressure.)  Activity Tolerance: Patient tolerated treatment well Patient left: in chair;with call bell/phone within reach  OT Visit Diagnosis: Unsteadiness on feet (R26.81);Other abnormalities of gait and mobility (R26.89);Repeated falls (R29.6);Muscle weakness (generalized) (M62.81);History of falling (Z91.81);Adult, failure to thrive (R62.7);Dizziness and giddiness (R42)                Time: 1308-6578 OT Time Calculation (min): 30 min Charges:  OT General Charges $OT Visit: 1 Visit OT Evaluation $OT Eval Moderate Complexity: 1 Mod  Geraldyne Barraclough OT, MOT  Thurnell Floss 07/21/2023, 9:35 AM

## 2023-07-21 NOTE — Progress Notes (Signed)
 PROGRESS NOTE  Antonio Lucas MVH:[id number] DOB: Sep 23, 1955 DOA: 07/20/2023 PCP: Willma Hartmann, PA-C  Brief History:  68 year old male with a history of NAFLD cirrhosis, hypertension, failure to thrive, multiple prior spinal fusions and spinal cord stimulator (done at [facility] by Dr. Jaquita Merl), presents with nausea, vomiting, and dizziness.  The patient fell 3 days prior to this admission. He does endorse problems with memory and intermittent confusion which is not new, states he has been compliant with his lactulose  PTA, was also started on Megace  secondary to poor p.o. intake with his recent hospitalization. He is barely ambulatory at home using a walker or a cane, but spends a lot of time laying in his new hospital bed.  Patient states that he was repositioning himself on the side of his hospital bed to use the urinal.  He was so weak he was not able to get himself back into the bed. He endorses poor oral intake for the past 2 to 3 weeks.  At baseline, the patient has had poor oral intake in the setting of his previous history of Roux-en-Y bypass over 6 years ago.  He states that his poor oral intake has been significantly worsened since his falling resulting in pain from his cervical spine fractures and rib fractures. He endorses intermittent nausea and vomiting which is incited by his dizziness.  He states that his dizziness is worse with movement of his head and neck.  He has tried meclizine  without much relief.  He denies any worsening headache, hematemesis, chest pain, shortness breath, diarrhea, hematochezia, melena. Dr.Elsner. discussed pt's hx, cirrhosis, frequent falls and todays CT findings - he also reviewed films. Given fall occurred 3 days ago, no indication for further imaging or neurosurgical intervention as he has also passed observation time. Also reviewed CT c spine films and does not feel pt needs MR imaging of c spine.  In the ED, the patient was afebrile  hemodynamically stable with oxygen saturation 99% room air.  WBC 5.9, hemoglobin 9.1, platelet 209.  Sodium 137, potassium 4.1, bicarbonate 23, serum creatinine 1.00.  LFTs unremarkable.  COVID-19 PCR is negative.  UA 21-50 WBC   Notably, the patient had a hospital admission at [facility] from 06/26/2023 to 07/01/2023 after he sustained injuries from a series of ground-level falls.  It was noted that the patient has had issues with poor nutrition and generalized weakness.  The patient was found to have displaced type II odontoid fracture of C2 with dorsal angulation.  There is age-indeterminate fracture at C4-5, there was poor surgical changes C3-C7 ACDF.  He was managed postoperatively.  He was discharged with instructions to follow-up with spinal surgery at [facility]. He also sustained a nondisplaced fracture of the right anterior fourth rib and right fifth rib through eighth ribs.  He was noted to have a patulous fluid-filled esophagus placing him at high risk for aspiration.  The patient had progressive weakness at home with difficulty ambulating and sustained another fall This resulted in an additional hospital admission from 07/10/2023 to 07/14/2023 at [facility].  He was treated for failure to thrive and started on Megace .  He was noted to have new left 7th through 9th rib fractures.  The patient and family declined SNF.  The patient was discharged home with home health and palliative care.   Assessment/Plan: Subarachnoid hemorrhage -CT brain--scattered subarachnoid hemorrhage most prominent in the left sylvian fissure.  Additional small foci of extra  axial hemorrhage in the basilar cisterns -Due to these findings EDP reached out to neurosurgery, Dr. Ellery Guthrie, who reported that patient would be outside of the observation time window and did not recommend any further imaging as patient has passed the observation time.  - 07/21/2023--no focal neurologic deficits, mental status at baseline  Hypoglycemia -  Secondary to failure to thrive and poor oral intake in the setting of NAFLD cirrhosis - Start nutritional supplements - Check A1c - A.m. cortisol - TSH  Failure to thrive - PT evaluation - Due to using up his Medicare days, the patient would prefer to go home - Liberalize diet - B12 - Folate - UA 21-50WBC  Type III dens cervical spine fracture CT C-spine with known type III dens fracture with increased listhesis at C4-5 and MR was recommended however per EDP neurosurgery does not recommend MRI C-spine. - Continue Aspen-collar  Pyuria - UA 21-50 WBC - Send urine culture - Empirically treat for UTI  Multiple rib fractures Status post falls left 7th and 9th ribs laterally displaced per previous chest x-ray -Patient also noted to have right-sided rib fractures from previous hospitalizations - Norco for pain control  Nonalcoholic steatohepatitis (NASH) -clinically compensated -ammonia = 15 - Continue home lactulose   Labile blood pressures - Reviewed the medical record shows in general the patient has had soft blood pressures>>> secondary to his liver cirrhosis - Holding diuretics temporarily   Family Communication:  no Family at bedside  Consultants:  none  Code Status:  FULL   DVT Prophylaxis:  Fletcher Heparin  / Loco Lovenox    Procedures: As Listed in Progress Note Above  Antibiotics: None       Subjective: Patient denies fevers, chills, headache, chest pain, dyspnea, nausea, vomiting, diarrhea, abdominal pain, dysuria, hematuria, hematochezia, and melena.   Objective: Vitals:   07/21/23 0400 07/21/23 0445 07/21/23 0500 07/21/23 0700  BP: (!) 126/101  115/75 103/75  Pulse: (!) 101  95 91  Resp: 11  14 13   Temp:  98.4 F (36.9 C)    TempSrc:  Oral    SpO2: 99%  100% 98%  Weight:      Height:        Intake/Output Summary (Last 24 hours) at 07/21/2023 0748 Last data filed at 07/21/2023 0500 Gross per 24 hour  Intake --  Output 1150 ml  Net -1150 ml    Weight change:  Exam:  General:  Pt is alert, follows commands appropriately, not in acute distress HEENT: No icterus, No thrush, No neck mass, Mason/AT Cardiovascular: RRR, S1/S2, no rubs, no gallops Respiratory: CTA bilaterally, no wheezing, no crackles, no rhonchi Abdomen: Soft/+BS, non tender, non distended, no guarding Extremities: No edema, No lymphangitis, No petechiae, No rashes, no synovitis Neuro:  CN II-XII intact, strength 4/5 in RUE, RLE, strength 4/5 LUE, LLE; sensation intact bilateral; no dysmetria; babinski equivocal    Data Reviewed: I have personally reviewed following labs and imaging studies Basic Metabolic Panel: Recent Labs  Lab 07/20/23 1536 07/21/23 0428  NA 137 137  K 4.1 3.3*  CL 111 112*  CO2 23 22  GLUCOSE 203* 161*  BUN 14 12  CREATININE 1.00 0.90  CALCIUM  7.8* 7.3*  MG  --  2.0  PHOS  --  2.7   Liver Function Tests: Recent Labs  Lab 07/20/23 1536 07/21/23 0428  AST 25 27  ALT 20 20  ALKPHOS 102 103  BILITOT 0.8 0.7  PROT 4.7* 4.7*  ALBUMIN  2.1* 2.1*   No  results for input(s): "LIPASE", "AMYLASE" in the last 168 hours. Recent Labs  Lab 07/20/23 1539  AMMONIA 15   Coagulation Profile: No results for input(s): "INR", "PROTIME" in the last 168 hours. CBC: Recent Labs  Lab 07/20/23 1536 07/21/23 0428  WBC 5.9 7.6  NEUTROABS 3.9 5.2  HGB 9.1* 9.5*  HCT 27.3* 28.7*  MCV 107.5* 106.3*  PLT 209 219   Cardiac Enzymes: No results for input(s): "CKTOTAL", "CKMB", "CKMBINDEX", "TROPONINI" in the last 168 hours. BNP: Invalid input(s): "POCBNP" CBG: Recent Labs  Lab 07/20/23 1644 07/20/23 1936 07/20/23 2228 07/21/23 0621 07/21/23 0742  GLUCAP 107* 110* 107* 158* 125*   HbA1C: No results for input(s): "HGBA1C" in the last 72 hours. Urine analysis:    Component Value Date/Time   COLORURINE YELLOW 07/20/2023 1527   APPEARANCEUR CLEAR 07/20/2023 1527   LABSPEC 1.012 07/20/2023 1527   PHURINE 6.0 07/20/2023 1527    GLUCOSEU >=500 (A) 07/20/2023 1527   HGBUR MODERATE (A) 07/20/2023 1527   BILIRUBINUR NEGATIVE 07/20/2023 1527   KETONESUR NEGATIVE 07/20/2023 1527   PROTEINUR 30 (A) 07/20/2023 1527   UROBILINOGEN 0.2 11/14/2012 0920   NITRITE NEGATIVE 07/20/2023 1527   LEUKOCYTESUR TRACE (A) 07/20/2023 1527   Sepsis Labs: @LABRCNTIP (procalcitonin:4,lacticidven:4) ) Recent Results (from the past 240 hours)  Resp panel by RT-PCR (RSV, Flu A&B, Covid) Anterior Nasal Swab     Status: None   Collection Time: 07/20/23  3:45 PM   Specimen: Anterior Nasal Swab  Result Value Ref Range Status   SARS Coronavirus 2 by RT PCR NEGATIVE NEGATIVE Final    Comment: (NOTE) SARS-CoV-2 target nucleic acids are NOT DETECTED.  The SARS-CoV-2 RNA is generally detectable in upper respiratory specimens during the acute phase of infection. The lowest concentration of SARS-CoV-2 viral copies this assay can detect is 138 copies/mL. A negative result does not preclude SARS-Cov-2 infection and should not be used as the sole basis for treatment or other patient management decisions. A negative result may occur with  improper specimen collection/handling, submission of specimen other than nasopharyngeal swab, presence of viral mutation(s) within the areas targeted by this assay, and inadequate number of viral copies(<138 copies/mL). A negative result must be combined with clinical observations, patient history, and epidemiological information. The expected result is Negative.  Fact Sheet for Patients:  BloggerCourse.com  Fact Sheet for Healthcare Providers:  SeriousBroker.it  This test is no t yet approved or cleared by the [place]  FDA and  has been authorized for detection and/or diagnosis of SARS-CoV-2 by FDA under an Emergency Use Authorization (EUA). This EUA will remain  in effect (meaning this test can be used) for the duration of the COVID-19 declaration  under Section 564(b)(1) of the Act, 21 U.S.C.section 360bbb-3(b)(1), unless the authorization is terminated  or revoked sooner.       Influenza A by PCR NEGATIVE NEGATIVE Final   Influenza B by PCR NEGATIVE NEGATIVE Final    Comment: (NOTE) The Xpert Xpress SARS-CoV-2/FLU/RSV plus assay is intended as an aid in the diagnosis of influenza from Nasopharyngeal swab specimens and should not be used as a sole basis for treatment. Nasal washings and aspirates are unacceptable for Xpert Xpress SARS-CoV-2/FLU/RSV testing.  Fact Sheet for Patients: BloggerCourse.com  Fact Sheet for Healthcare Providers: SeriousBroker.it  This test is not yet approved or cleared by the [place]  FDA and has been authorized for detection and/or diagnosis of SARS-CoV-2 by FDA under an Emergency Use Authorization (EUA). This EUA will remain in effect (meaning  this test can be used) for the duration of the COVID-19 declaration under Section 564(b)(1) of the Act, 21 U.S.C. section 360bbb-3(b)(1), unless the authorization is terminated or revoked.     Resp Syncytial Virus by PCR NEGATIVE NEGATIVE Final    Comment: (NOTE) Fact Sheet for Patients: BloggerCourse.com  Fact Sheet for Healthcare Providers: SeriousBroker.it  This test is not yet approved or cleared by the [place]  FDA and has been authorized for detection and/or diagnosis of SARS-CoV-2 by FDA under an Emergency Use Authorization (EUA). This EUA will remain in effect (meaning this test can be used) for the duration of the COVID-19 declaration under Section 564(b)(1) of the Act, 21 U.S.C. section 360bbb-3(b)(1), unless the authorization is terminated or revoked.  Performed at [facility], [address] [place], [place] [address]   MRSA Next Gen by PCR, Nasal     Status: None   Collection Time: 07/20/23 10:20 PM   Specimen: Nasal  Mucosa; Nasal Swab  Result Value Ref Range Status   MRSA by PCR Next Gen NOT DETECTED NOT DETECTED Final    Comment: (NOTE) The GeneXpert MRSA Assay (FDA approved for NASAL specimens only), is one component of a comprehensive MRSA colonization surveillance program. It is not intended to diagnose MRSA infection nor to guide or monitor treatment for MRSA infections. Test performance is not FDA approved in patients less than 70 years old. Performed at [facility], [address] [place], [place] [address]      Scheduled Meds:  Chlorhexidine  Gluconate Cloth  6 each Topical Daily   doxycycline   100 mg Oral BID   DULoxetine   40 mg Oral QHS   enoxaparin  (LOVENOX ) injection  40 mg Subcutaneous Q24H   famotidine   40 mg Oral QHS   lactulose   20 g Oral TID   levothyroxine   37.5 mcg Oral QAC breakfast   megestrol   400 mg Oral Daily   potassium chloride   40 mEq Oral Once   spironolactone   200 mg Oral Daily   tadalafil   5 mg Oral QHS   torsemide   20 mg Oral BID   traZODone   200 mg Oral QHS   Continuous Infusions:  albumin  human      Procedures/Studies: CT Head Wo Contrast Result Date: 07/20/2023 CLINICAL DATA:  Head trauma, fall 3 days ago comment dizziness. EXAM: CT HEAD WITHOUT CONTRAST CT CERVICAL SPINE WITHOUT CONTRAST TECHNIQUE: Multidetector CT imaging of the head and cervical spine was performed following the standard protocol without intravenous contrast. Multiplanar CT image reconstructions of the cervical spine were also generated. RADIATION DOSE REDUCTION: This exam was performed according to the departmental dose-optimization program which includes automated exposure control, adjustment of the mA and/or kV according to patient size and/or use of iterative reconstruction technique. COMPARISON:  CT head 05/19/2020, CT FINDINGS: CT HEAD FINDINGS Brain: There are multiple foci of subarachnoid hemorrhage most pronounced in the left sylvian fissure. Additional scattered small volume  subarachnoid hemorrhage in the posterior left frontal lobe, bilateral occipital lobes and right frontal lobe. Small external focus of extra-axial hemorrhage within the right lateral aspect of the quadrigeminal cistern. No areas of parenchymal hemorrhage identified. There is a small focus of hypoattenuation within the left subinsular region which may reflect infarct versus contusion. No significant mass effect or midline shift. The basilar cisterns are otherwise patent. There are hypoattenuating extra-axial collections over the bilateral frontal convexities measuring 9 mm in thickness on the right and 7 mm on the left. Vascular: No hyperdense vessel or unexpected calcification.  Skull: Normal. Negative for fracture or focal lesion. Sinuses/Orbits: Orbits are symmetric. Mucosal thickening in the right frontal sinus and ethmoid sinuses. Additional mild mucosal thickening in the right maxillary sinus. Other: Mastoid air cells are clear. CT CERVICAL SPINE FINDINGS Alignment: Straightening of the normal cervical lordosis. There is trace anterolisthesis of C4 on C5 which appears new since the prior MRI. Similar trace anterolisthesis of C5 on C6. No facet subluxation or dislocation. Skull base and vertebrae: There is a mildly displaced fracture through the dens which extends into the right lateral mass of C2 concerning for type 3 dens fracture. There is slight angulation of the dens with maintained atlantal dens interval. Anterior cervical fusion at C3-4 and at C4-C7 with interbody spacers at multiple levels. Hardware is intact. Lucency along the anterior aspect of the C5 vertebra with well corticated margins which is likely chronic. No suspicious osseous lesion. Soft tissues and spinal canal: No prevertebral fluid or swelling. No visible canal hematoma. Disc levels: Intervertebral disc space narrowing at multiple levels. There is no high-grade osseous spinal canal stenosis. Facet arthrosis at multiple levels. Significant  foraminal narrowing on the right at C3-4 and C4-5. Upper chest: Bilateral pleural effusions, left greater than right. Other: None. IMPRESSION: Scattered subarachnoid hemorrhage as described above most pronounced in the left sylvian fissure. Additional small focus of extra-axial hemorrhage in the basilar cisterns. Hypoattenuation in the left subinsular region which could reflect focus of contusion versus infarct. 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 No midline shift. Type 3 dens fracture extending into the right lateral mass of C2 with mild displacement and angulation. Increased listhesis at C4-5 which appears new since the prior MRI. Recommend MRI for further evaluation of ligamentous injury. Bilateral pleural effusions. These results were called by telephone at the time of interpretation on 07/20/2023 at 4:28 pm to provider Dr. Annabell Key, who verbally acknowledged these results. Electronically Signed   By: Denny Flack M.D.   On: 07/20/2023 16:49   CT Cervical Spine Wo Contrast Result Date: 07/20/2023 CLINICAL DATA:  Head trauma, fall 3 days ago comment dizziness. EXAM: CT HEAD WITHOUT CONTRAST CT CERVICAL SPINE WITHOUT CONTRAST TECHNIQUE: Multidetector CT imaging of the head and cervical spine was performed following the standard protocol without intravenous contrast. Multiplanar CT image reconstructions of the cervical spine were also generated. RADIATION DOSE REDUCTION: This exam was performed according to the departmental dose-optimization program which includes automated exposure control, adjustment of the mA and/or kV according to patient size and/or use of iterative reconstruction technique. COMPARISON:  CT head 05/19/2020, CT FINDINGS: CT HEAD FINDINGS Brain: There are multiple foci of subarachnoid hemorrhage most pronounced in the left sylvian fissure. Additional scattered small volume subarachnoid  hemorrhage in the posterior left frontal lobe, bilateral occipital lobes and right frontal lobe. Small external focus of extra-axial hemorrhage within the right lateral aspect of the quadrigeminal cistern. No areas of parenchymal hemorrhage identified. There is a small focus of hypoattenuation within the left subinsular region which may reflect infarct versus contusion. No significant mass effect or midline shift. The basilar cisterns are otherwise patent. There are hypoattenuating extra-axial collections over the bilateral frontal convexities measuring 9 mm in thickness on the right and 7 mm on the left. Vascular: No hyperdense vessel or unexpected calcification. Skull: Normal. Negative for fracture or focal lesion. Sinuses/Orbits: Orbits are symmetric. Mucosal thickening in the right frontal sinus and ethmoid sinuses. Additional mild mucosal thickening in the right  maxillary sinus. Other: Mastoid air cells are clear. CT CERVICAL SPINE FINDINGS Alignment: Straightening of the normal cervical lordosis. There is trace anterolisthesis of C4 on C5 which appears new since the prior MRI. Similar trace anterolisthesis of C5 on C6. No facet subluxation or dislocation. Skull base and vertebrae: There is a mildly displaced fracture through the dens which extends into the right lateral mass of C2 concerning for type 3 dens fracture. There is slight angulation of the dens with maintained atlantal dens interval. Anterior cervical fusion at C3-4 and at C4-C7 with interbody spacers at multiple levels. Hardware is intact. Lucency along the anterior aspect of the C5 vertebra with well corticated margins which is likely chronic. No suspicious osseous lesion. Soft tissues and spinal canal: No prevertebral fluid or swelling. No visible canal hematoma. Disc levels: Intervertebral disc space narrowing at multiple levels. There is no high-grade osseous spinal canal stenosis. Facet arthrosis at multiple levels. Significant foraminal  narrowing on the right at C3-4 and C4-5. Upper chest: Bilateral pleural effusions, left greater than right. Other: None. IMPRESSION: Scattered subarachnoid hemorrhage as described above most pronounced in the left sylvian fissure. Additional small focus of extra-axial hemorrhage in the basilar cisterns. Hypoattenuation in the left subinsular region which could reflect focus of contusion versus infarct. 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 No midline shift. Type 3 dens fracture extending into the right lateral mass of C2 with mild displacement and angulation. Increased listhesis at C4-5 which appears new since the prior MRI. Recommend MRI for further evaluation of ligamentous injury. Bilateral pleural effusions. These results were called by telephone at the time of interpretation on 07/20/2023 at 4:28 pm to provider Dr. Annabell Key, who verbally acknowledged these results. Electronically Signed   By: Denny Flack M.D.   On: 07/20/2023 16:49   DG Ribs Unilateral W/Chest Left Result Date: 07/10/2023 CLINICAL DATA:  Recent fall with left rib pain, initial encounter EXAM: LEFT RIBS AND CHEST - 3+ VIEW COMPARISON:  None Available. FINDINGS: Cardiac shadow is within normal limits. Spinal stimulator is seen. Scattered linear scarring is noted in the lungs bilaterally. No pneumothorax is noted. Small left effusion is noted. Multiple left-sided rib fractures are noted involving the seventh through ninth ribs. Postsurgical changes are noted in the left upper quadrant. IMPRESSION: Mildly displaced fractures of the left seventh through ninth ribs laterally. No pneumothorax is noted. Small effusion is seen. Electronically Signed   By: Violeta Grey M.D.   On: 07/10/2023 11:29    Demaris Fillers, DO  [facility]  If 7PM-7AM, please contact night-coverage [email or web address] Password [email or web address] 07/21/2023, 7:48 AM   LOS: 0 days

## 2023-07-21 NOTE — Plan of Care (Signed)

## 2023-07-22 LAB — URINE CULTURE: Culture: NO GROWTH

## 2023-10-02 DEATH — deceased

## 2023-12-16 ENCOUNTER — Encounter (INDEPENDENT_AMBULATORY_CARE_PROVIDER_SITE_OTHER): Payer: Self-pay | Admitting: Gastroenterology
